# Patient Record
Sex: Female | Born: 1995 | Race: White | Hispanic: No | Marital: Married | Smoking: Former smoker
Health system: Southern US, Community
[De-identification: ages and names within clinical notes are randomized; demographics above are authoritative.]

## PROBLEM LIST (undated history)

## (undated) ENCOUNTER — Inpatient Hospital Stay: Payer: Self-pay

## (undated) ENCOUNTER — Emergency Department (HOSPITAL_COMMUNITY): Admission: EM | Payer: Medicaid Other

## (undated) DIAGNOSIS — F419 Anxiety disorder, unspecified: Secondary | ICD-10-CM

## (undated) DIAGNOSIS — R112 Nausea with vomiting, unspecified: Secondary | ICD-10-CM

## (undated) DIAGNOSIS — I10 Essential (primary) hypertension: Secondary | ICD-10-CM

## (undated) DIAGNOSIS — O139 Gestational [pregnancy-induced] hypertension without significant proteinuria, unspecified trimester: Secondary | ICD-10-CM

## (undated) DIAGNOSIS — Z9889 Other specified postprocedural states: Secondary | ICD-10-CM

## (undated) DIAGNOSIS — R110 Nausea: Secondary | ICD-10-CM

## (undated) DIAGNOSIS — J45909 Unspecified asthma, uncomplicated: Secondary | ICD-10-CM

## (undated) DIAGNOSIS — R7303 Prediabetes: Secondary | ICD-10-CM

## (undated) DIAGNOSIS — Z8679 Personal history of other diseases of the circulatory system: Secondary | ICD-10-CM

## (undated) DIAGNOSIS — K279 Peptic ulcer, site unspecified, unspecified as acute or chronic, without hemorrhage or perforation: Secondary | ICD-10-CM

## (undated) DIAGNOSIS — R109 Unspecified abdominal pain: Secondary | ICD-10-CM

## (undated) DIAGNOSIS — F32A Depression, unspecified: Secondary | ICD-10-CM

## (undated) HISTORY — PX: CHOLECYSTECTOMY: SHX55

## (undated) HISTORY — DX: Peptic ulcer, site unspecified, unspecified as acute or chronic, without hemorrhage or perforation: K27.9

## (undated) HISTORY — DX: Nausea: R11.0

## (undated) HISTORY — PX: TONSILLECTOMY: SUR1361

## (undated) HISTORY — DX: Personal history of other diseases of the circulatory system: Z86.79

## (undated) HISTORY — DX: Prediabetes: R73.03

## (undated) HISTORY — PX: WISDOM TOOTH EXTRACTION: SHX21

## (undated) HISTORY — DX: Unspecified abdominal pain: R10.9

## (undated) HISTORY — PX: GALLBLADDER SURGERY: SHX652

## (undated) HISTORY — DX: Essential (primary) hypertension: I10

---
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
## 2015-01-16 ENCOUNTER — Ambulatory Visit: Payer: Medicaid Other | Attending: Family Medicine | Admitting: Physical Therapy

## 2015-01-16 DIAGNOSIS — M545 Low back pain: Secondary | ICD-10-CM | POA: Diagnosis not present

## 2015-01-16 DIAGNOSIS — G8929 Other chronic pain: Secondary | ICD-10-CM | POA: Diagnosis present

## 2015-01-16 NOTE — Therapy (Signed)
[facility] PHYSICAL AND SPORTS MEDICINE [address] [place], [place], [address] Phone: [phone]   Fax:  [phone]  Physical Therapy Evaluation  Patient Details  Name: Stephanie MartesCierra Shannon MRN: [id number] Date of Birth: [birth year] No Data Recorded  Encounter Date: 01/16/2015      PT End of Session - 01/16/15 1118    Visit Number 1   Number of Visits 7   Date for PT Re-Evaluation 02/27/15   PT Start Time 1020   PT Stop Time 1105   PT Time Calculation (min) 45 min   Behavior During Therapy Flat affect  Very disinterested       Past Medical History  Diagnosis Date  . Peptic ulcer disease   . Nausea   . Abdominal pain   . Asthma     Past Surgical History  Procedure Laterality Date  . Tonsillectomy      There were no vitals filed for this visit.  Visit Diagnosis:  Chronic midline low back pain without sciatica - Plan: PT plan of care cert/re-cert      Subjective Assessment - 01/16/15 1122    Subjective Pt reports that she fell from a bunk bed roughly 6 years ago and "broke a bone in her back", she reports she did not see a doctor for this. She reports at first her pain was intermittent, now it is constant. SHe works at [employer] currently, but will be switching to [employer] shortly.    Limitations Lifting   Patient Stated Goals Patient does not provide any stated goals.    Currently in Pain? Yes   Pain Score 4    Pain Location Abdomen   Pain Orientation Lower   Pain Descriptors / Indicators Aching   Pain Type Chronic pain   Pain Onset More than a month ago   Pain Frequency Constant   Aggravating Factors  Lifting    Pain Relieving Factors She does not report anything alleviating her pain.             [facility] Assessment - 01/16/15 0001    Assessment   Medical Diagnosis --  Acute low back pain   Precautions   Precautions None   Restrictions   Weight Bearing Restrictions No   Observation/Other Assessments   Modified Osw[place]ry 16   Fear Avoidance Belief Questionnaire (FABQ)  18   Posture/Postural Control   Posture Comments --  Rounded shoulders, lordotic lumbar spine   Strength   Overall Strength Comments --  5/5 for tested LEs   FABER test   findings Negative   Slump test   Findings Negative   Straight Leg Raise   Findings Negative   Criag's Test    Findings Negative   Ely's Test   Findings Negative   Hip Scouring   Findings Negative     Piriformis stretching, hamstring stretching, IR, ER of the hip all demonstrated WNL mobility and no change in pain quality.   CPAs grade I-II in lumbar spine described as tender, however no change in pain quality.   Repeated trunk flexion x 10 was not aggravating, did not decrease pain she experienced with trunk extension.   Trunk rotations sitting at corner of table x 3 bilaterally with self over-pressure no change in pain reported. She reports she does these at home and feels relief when she feels a cavitation.   MMT of glute max demonstrates substitution of lumbar paraspinals. Supine bridging with cuing for pushing through her heels demonstrates  altered NM control (lumbar extensors activated prior to glutes per where she felt activity). X 10 repetitions with 3-5" holds increased symptoms.   Manual cuing for assistance with pelvic rocking in supine x 30 seconds, no change in quality of pain.                       PT Education - 01/16/15 1117    Education provided Yes   Education Details Educated on lifting technique.    Person(s) Educated Patient   Methods Explanation;Demonstration   Comprehension Verbalized understanding;Returned demonstration             PT Long Term Goals - 01/16/15 1142    PT LONG TERM GOAL #1   Title Patient will be independent with a HEP to decrease her LBP symptoms.    Time 6   Period Weeks   Status New   PT LONG TERM GOAL #2   Title Patient will report an ODI score of less than 9% to  demonstrate increased tolerance for ADLs and work related activities.    Baseline 18%   Time 6   Period Weeks   Status New   PT LONG TERM GOAL #3   Title Patient will demonstrate proper lifting mechanics consistently and report no increase in pain while lifting a 20# weight to demonstrate increased tolerance for work related activities.    Time 6   Period Weeks   Status New               Plan - 01/16/15 1130    Clinical Impression Statement Patient demonstrates very withdrawn affect and does not make eye contact with PT for long periods of time during evaluation. No real biomechanical origin of symptoms was identified today as patient states nothing performed today relieved or exacerbated her symptoms (aside from end range trunk extension). She does demonstrate poor lifting mechanics and generalized hypermobility indicating she may have some instability, though unable to formally assess this beyond ROM and technique modification.    Pt will benefit from skilled therapeutic intervention in order to improve on the following deficits Pain;Improper body mechanics   Rehab Potential Poor   Clinical Impairments Affecting Rehab Potential Patient has very withdrawn affect and does not seem interested in participating in this session.    PT Frequency 1x / week   PT Duration 6 weeks   PT Treatment/Interventions Manual techniques;Therapeutic exercise;Therapeutic activities;Cryotherapy;Gait training   PT Next Visit Plan Re-assess mechanisms behind symptomology.    Consulted and Agree with Plan of Care Patient         Problem List Patient Active Problem List   Diagnosis Date Noted  . Nausea with vomiting   . RUQ abdominal pain 07/21/2011  . Diarrhea 07/21/2011  . Excessive gas 07/21/2011  . Hematemesis    Kerin Ransom, PT, DPT    01/16/2015, 4:21 PM  [facility] PHYSICAL AND SPORTS MEDICINE [address] [place], [address] Phone:  [phone]   Fax:  [phone]  Name: [name] MRN: [id number] Date of Birth: 1995/07/16

## 2015-01-18 ENCOUNTER — Emergency Department
Admission: EM | Admit: 2015-01-18 | Discharge: 2015-01-18 | Disposition: A | Discharge status: Home / Self Care | Payer: Medicaid Other | Attending: Emergency Medicine | Admitting: Emergency Medicine

## 2015-01-18 ENCOUNTER — Encounter: Payer: Self-pay | Admitting: Emergency Medicine

## 2015-01-18 DIAGNOSIS — Z7951 Long term (current) use of inhaled steroids: Secondary | ICD-10-CM | POA: Insufficient documentation

## 2015-01-18 DIAGNOSIS — F17210 Nicotine dependence, cigarettes, uncomplicated: Secondary | ICD-10-CM | POA: Diagnosis not present

## 2015-01-18 DIAGNOSIS — K029 Dental caries, unspecified: Secondary | ICD-10-CM | POA: Diagnosis not present

## 2015-01-18 DIAGNOSIS — K0889 Other specified disorders of teeth and supporting structures: Secondary | ICD-10-CM | POA: Diagnosis present

## 2015-01-18 DIAGNOSIS — Z79899 Other long term (current) drug therapy: Secondary | ICD-10-CM | POA: Diagnosis not present

## 2015-01-18 MED ORDER — AMOXICILLIN 500 MG PO TABS: 500.0000 mg | ORAL_TABLET | Freq: Two times a day (BID) | ORAL | Status: DC

## 2015-01-18 MED ORDER — IBUPROFEN 800 MG PO TABS: 800.0000 mg | ORAL_TABLET | Freq: Three times a day (TID) | ORAL | Status: DC | PRN

## 2015-01-18 MED ORDER — HYDROCODONE-ACETAMINOPHEN 5-325 MG PO TABS: 1.0000 | ORAL_TABLET | ORAL | Status: DC | PRN

## 2015-01-18 MED ORDER — LIDOCAINE VISCOUS 2 % MT SOLN: 10.0000 mL | OROMUCOSAL | Status: DC | PRN

## 2015-01-18 NOTE — Discharge Instructions (Signed)
OPTIONS FOR DENTAL FOLLOW UP CARE ° °[place] Department of Health and Human Services - Local Safety Net Dental Clinics °http://www.ncdhhs.gov/dph/oralhealth/services/safetynetclinics.htm °  °[facility] (336-562-3123) ° °[facility] ([phone]) ° °[facility] ([phone] ext [phone]) ° °[facility] ([phone]) ° °[facility] ([phone]) °This clinic caters to the indigent population and is on a lottery system. °Location: °[facility], [address], [place] °Clinic Hours: °Wednesdays from 6pm - 9pm, patients seen by a lottery system. °For dates, call or go to [email or web address]/[facility]/patients/[email or web address]-[facility] °Services: °Cleanings, fillings and simple extractions. °Payment Options: °DENTAL WORK IS FREE OF CHARGE. Bring proof of income or support. °Best way to get seen: °Arrive at 5:15 pm - this is a lottery, NOT first come/first serve, so arriving earlier will not increase your chances of being seen. °  °  °[facility] °[phone] °Select option 1 for emergencies °  °Location: °[facility], [address], [place] °Clinic Hours: °No walk-ins accepted - call the day before to schedule an appointment. °Check in times are 9:30 am and 1:30 pm. °Services: °Simple extractions, temporary fillings, pulpectomy/pulp debridement, uncomplicated abscess drainage. °Payment Options: °PAYMENT IS DUE AT THE TIME OF SERVICE.  Fee is usually $100-200, additional surgical procedures (e.g. abscess drainage) may be extra. °Cash, checks, Visa/MasterCard accepted.  Can file Medicaid if patient is covered for dental - patient should call case worker to check. °No discount for [facility] patients. °Best way to get seen: °MUST call the day before and get onto the schedule. Can usually be seen the next 1-2 days. No walk-ins accepted. °  °  °[facility] °[phone] °   °Location: °[facility], [address], [place] °Clinic Hours: °M, W, Th, F 8am or 1:30pm, Tues 9a or 1:30 - first come/first served. °Services: °Simple extractions, temporary fillings, uncomplicated abscess drainage.  You do not need to be an [place] resident. °Payment Options: °PAYMENT IS DUE AT THE TIME OF SERVICE. °Dental insurance, otherwise sliding scale - bring proof of income or support. °Depending on income and treatment needed, cost is usually $50-200. °Best way to get seen: °Arrive early as it is first come/first served. °  °  °[facility] °[phone] °  °Location: °[address] °Clinic Hours: °Mon-Thu 8a-5p °Services: °Most basic dental services including extractions and fillings. °Payment Options: °PAYMENT IS DUE AT THE TIME OF SERVICE. °Sliding scale, up to 50% off - bring proof if income or support. °Medicaid with dental option accepted. °Best way to get seen: °Call to schedule an appointment, can usually be seen within 2 weeks OR they will try to see walk-ins - show up at 8a or 2p (you may have to wait). °  °  °[facility] °[phone] °[place] RESIDENTS ONLY °  °Location: °[facility], [address], [place],  [address] °Clinic Hours: By appointment only. °Monday - Thursday 8am-5pm, Friday 8am-12pm °Services: Cleanings, fillings, extractions. °Payment Options: °PAYMENT IS DUE AT THE TIME OF SERVICE. °Cash, Visa or MasterCard. Sliding scale - $30 minimum per service. °Best way to get seen: °Come in to office, complete packet and make an appointment - need proof of income °or support monies for each household member and proof of [place] residence. °Usually takes about a month to get in. °  °  °[facility] °[phone] °  °Location: °[address]   [place] [facility] Hours: Walk-in Urgent Care Dental Services are offered Monday-Friday  mornings only. The numbers of emergencies accepted daily is limited to the number of providers available. Maximum 15 - Mondays, Wednesdays & Thursdays Maximum 10 - Tuesdays & Fridays Services: You do not need to be a [place] resident to be seen for a dental emergency. Emergencies are defined as pain, swelling, abnormal bleeding, or dental trauma. Walkins will receive x-rays if needed. NOTE: Dental cleaning is not an emergency. Payment Options: PAYMENT IS DUE AT THE TIME OF SERVICE. Minimum co-pay is $40.00 for uninsured patients. Minimum co-pay is $3.00 for Medicaid with dental coverage. Dental Insurance is accepted and must be presented at time of visit. Medicare does not cover dental. Forms of payment: Cash, credit card, checks. Best way to get seen: If not previously registered with the clinic, walk-in dental registration begins at 7:15 am and is on a first come/first serve basis. If previously registered with the clinic, call to make an appointment.     [facility] [phone] [place] RESIDENTS ONLY   Location: [address], [place], [place] Clinic Hours: Mon-Thu 10a-2p Services: Extractions only! Payment Options: FREE (donations accepted) - bring proof of income or support Best way to get seen: Call and schedule an appointment OR come at 8am on the 1st Monday of every month (except for holidays) when it is first come/first served.     [facility] [phone]   Location: [address], [place] [facility] Hours: Friday mornings Services, Payment Options, Best way to get seen: Call for info   Dental Caries Dental caries is tooth decay. This decay can cause a hole in teeth (cavity) that can get bigger and deeper over time. HOME CARE  Brush and floss your teeth. Do this at least two times a day.  Use a fluoride toothpaste.  Use a mouth rinse if told by your dentist or doctor.  Eat less sugary and starchy foods. Drink less sugary  drinks.  Avoid snacking often on sugary and starchy foods. Avoid sipping often on sugary drinks.  Keep regular checkups and cleanings with your dentist.  Use fluoride supplements if told by your dentist or doctor.  Allow fluoride to be applied to teeth if told by your dentist or doctor.   This information is not intended to replace advice given to you by your health care provider. Make sure you discuss any questions you have with your health care provider.   Document Released: 11/25/2007 Document Revised: 03/08/2014 Document Reviewed: 02/18/2012 Elsevier Interactive Patient Education 2016 Elsevier Inc[employer]  Dental Pain Dental pain may be caused by many things, including:  Tooth decay (cavities or caries). Cavities expose the nerve of your tooth to air and hot or cold temperatures. This can cause pain or discomfort.  Abscess or infection. A dental abscess is a collection of infected pus from a bacterial infection in the inner part of the tooth (pulp). It usually occurs at the end of the tooth's root.  Injury.  An unknown reason (idiopathic). Your pain may be mild or severe. It may only occur when:  You are chewing.  You are exposed to hot or cold temperature.  You are eating or drinking sugary foods or beverages, such as soda or candy. Your pain may also be constant. HOME CARE INSTRUCTIONS Watch your dental pain for any changes. The following actions may help to lessen any discomfort that you are feeling:  Take medicines only as directed by your dentist.  If you were prescribed an antibiotic medicine, finish all of it even if you start to feel better.  Keep all follow-up visits as directed by your dentist. This is important.  Do not apply heat to the outside of your face.  Rinse your mouth or gargle with salt water if directed by your dentist. This helps with pain and swelling.  You can make salt water by adding  tsp of salt to 1 cup of warm water.  Apply ice to the  painful area of your face:  Put ice in a plastic bag.  Place a towel between your skin and the bag.  Leave the ice on for 20 minutes, 2-3 times per day.  Avoid foods or drinks that cause you pain, such as:  Very hot or very cold foods or drinks.  Sweet or sugary foods or drinks. SEEK MEDICAL CARE IF:  Your pain is not controlled with medicines.  Your symptoms are worse.  You have new symptoms. SEEK IMMEDIATE MEDICAL CARE IF:  You are unable to open your mouth.  You are having trouble breathing or swallowing.  You have a fever.  Your face, neck, or jaw is swollen.   This information is not intended to replace advice given to you by your health care provider. Make sure you discuss any questions you have with your health care provider.   Document Released: 02/15/2005 Document Revised: 07/02/2014 Document Reviewed: 02/11/2014 Elsevier Interactive Patient Education [employer]

## 2015-01-18 NOTE — ED Notes (Signed)
Pt to ed with c/o toothache x 2 days.

## 2015-01-18 NOTE — ED Provider Notes (Signed)
[facility] Emergency Department Provider Note  ____________________________________________  Time seen: Approximately 8:26 AM  I have reviewed the triage vital signs and the nursing notes.   HISTORY  Chief Complaint Dental Pain    HPI Flecia Deyton is a 19 y.o. female presents for evaluation of toothache 2 days. Rates her pain as a 10 over 10.   Past Medical History  Diagnosis Date  . Peptic ulcer disease   . Nausea   . Abdominal pain   . Asthma     Patient Active Problem List   Diagnosis Date Noted  . Nausea with vomiting   . RUQ abdominal pain 07/21/2011  . Diarrhea 07/21/2011  . Excessive gas 07/21/2011  . Hematemesis     Past Surgical History  Procedure Laterality Date  . Tonsillectomy      Current Outpatient Rx  Name  Route  Sig  Dispense  Refill  . amoxicillin (AMOXIL) 500 MG tablet   Oral   Take 1 tablet (500 mg total) by mouth 2 (two) times daily.   20 tablet   0   . beclomethasone (QVAR) 40 MCG/ACT inhaler   Inhalation   Inhale 2 puffs into the lungs 2 (two) times daily.         Marland Kitchen dicyclomine (BENTYL) 20 MG tablet   Oral   Take 1 tablet (20 mg total) by mouth every 6 (six) hours as needed for spasms (for abdominal cramping).   20 tablet   0   . famotidine (PEPCID) 20 MG tablet   Oral   Take 1 tablet (20 mg total) by mouth 2 (two) times daily.   30 tablet   0   . HYDROcodone-acetaminophen (NORCO) 5-325 MG tablet   Oral   Take 1-2 tablets by mouth every 4 (four) hours as needed for moderate pain.   15 tablet   0   . ibuprofen (ADVIL,MOTRIN) 800 MG tablet   Oral   Take 1 tablet (800 mg total) by mouth every 8 (eight) hours as needed.   30 tablet   0   . lamoTRIgine (LAMICTAL) 25 MG tablet   Oral   Take 25 mg by mouth 2 (two) times daily.         Marland Kitchen lidocaine (XYLOCAINE) 2 % solution   Mouth/Throat   Use as directed 10 mLs in the mouth or throat as needed for mouth pain (dental pain).   100 mL    0   . sertraline (ZOLOFT) 50 MG tablet   Oral   Take 50 mg by mouth daily.         . sucralfate (CARAFATE) 1 G tablet   Oral   Take 1 tablet (1 g total) by mouth 4 (four) times daily.   30 tablet   0     Allergies Metformin and related and Sprintec 28  Family History  Problem Relation Age of Onset  . Cholelithiasis Mother   . SIDS Brother   . Cholelithiasis Maternal Grandmother   . Ulcers Maternal Grandmother   . Celiac disease Neg Hx     Social History Social History  Substance Use Topics  . Smoking status: Current Every Day Smoker -- 1.00 packs/day    Types: Cigarettes  . Smokeless tobacco: Never Used  . Alcohol Use: No    Review of Systems Constitutional: No fever/chills Eyes: No visual changes. ENT: No sore throat. Positive for toothache. Cardiovascular: Denies chest pain. Respiratory: Denies shortness of breath. Gastrointestinal: No abdominal pain.  No nausea, no vomiting.  No diarrhea.  No constipation. Genitourinary: Negative for dysuria. Musculoskeletal: Negative for back pain. Skin: Negative for rash. Neurological: Negative for headaches, focal weakness or numbness.  10-point ROS otherwise negative.  ____________________________________________   PHYSICAL EXAM:  VITAL SIGNS: ED Triage Vitals  Enc Vitals Group     BP --      Pulse --      Resp --      Temp --      Temp src --      SpO2 --      Weight --      Height --      Head Cir --      Peak Flow --      Pain Score 01/18/15 0823 10     Pain Loc --      Pain Edu? --      Excl. in GC? --     Constitutional: Alert and oriented. Well appearing and in no acute distress. Eyes: Conjunctivae are normal. PERRL. EOMI. Head: Atraumatic. Nose: No congestion/rhinnorhea. Mouth/Throat: Mucous membranes are moist.  Oropharynx non-erythematous. Neck: No stridor.   Cardiovascular: Normal rate, regular rhythm. Grossly normal heart sounds.  Good peripheral circulation. Respiratory: Normal  respiratory effort.  No retractions. Lungs CTAB. Gastrointestinal: Soft and nontender. No distention. No abdominal bruits. No CVA tenderness. Musculoskeletal: No lower extremity tenderness nor edema.  No joint effusions. Neurologic:  Normal speech and language. No gross focal neurologic deficits are appreciated. No gait instability. Skin:  Skin is warm, dry and intact. No rash noted. Psychiatric: Mood and affect are normal. Speech and behavior are normal.  ____________________________________________   LABS (all labs ordered are listed, but only abnormal results are displayed)  Labs Reviewed - No data to display ____________________________________________   PROCEDURES  Procedure(s) performed: None  Critical Care performed: No  ____________________________________________   INITIAL IMPRESSION / ASSESSMENT AND PLAN / ED COURSE  Pertinent labs & imaging results that were available during my care of the patient were reviewed by me and considered in my medical decision making (see chart for details).  Acute dental pain with dental caries. Rx given for amoxicillin 500 mg 3 times a day #30, Motrin 800 mg 3 times a day. In addition she was given some hydrocodone 5/325 #10. Patient follow-up with local dentists, list has been provided. She voices no other emergency medical complaints at this time. ____________________________________________   FINAL CLINICAL IMPRESSION(S) / ED DIAGNOSES  Final diagnoses:  Pain due to dental caries      Evangeline Dakinharles M Beers, PA-C 01/18/15 [id number]  Myrna Blazeravid Matthew Schaevitz, MD 01/18/15 [phone]

## 2015-01-20 ENCOUNTER — Ambulatory Visit: Payer: Medicaid Other | Admitting: Physical Therapy

## 2015-01-22 ENCOUNTER — Ambulatory Visit: Payer: Medicaid Other | Admitting: Physical Therapy

## 2015-01-28 ENCOUNTER — Ambulatory Visit: Payer: Medicaid Other | Admitting: Physical Therapy

## 2015-01-30 ENCOUNTER — Ambulatory Visit: Payer: Medicaid Other | Attending: Family Medicine | Admitting: Physical Therapy

## 2015-01-31 ENCOUNTER — Emergency Department
Admission: EM | Admit: 2015-01-31 | Discharge: 2015-01-31 | Disposition: A | Discharge status: Home / Self Care | Payer: Medicaid Other | Attending: Emergency Medicine | Admitting: Emergency Medicine

## 2015-01-31 DIAGNOSIS — K029 Dental caries, unspecified: Secondary | ICD-10-CM | POA: Insufficient documentation

## 2015-01-31 DIAGNOSIS — Z792 Long term (current) use of antibiotics: Secondary | ICD-10-CM | POA: Insufficient documentation

## 2015-01-31 DIAGNOSIS — K0889 Other specified disorders of teeth and supporting structures: Secondary | ICD-10-CM | POA: Diagnosis not present

## 2015-01-31 DIAGNOSIS — F17210 Nicotine dependence, cigarettes, uncomplicated: Secondary | ICD-10-CM | POA: Insufficient documentation

## 2015-01-31 DIAGNOSIS — Z7951 Long term (current) use of inhaled steroids: Secondary | ICD-10-CM | POA: Diagnosis not present

## 2015-01-31 MED ORDER — HYDROCODONE-ACETAMINOPHEN 5-325 MG PO TABS
1.0000 | ORAL_TABLET | Freq: Once | ORAL | Status: AC
  Administered 2015-01-31: 1 via ORAL
  Filled 2015-01-31: qty 1

## 2015-01-31 MED ORDER — TRAMADOL HCL 50 MG PO TABS: 50.0000 mg | ORAL_TABLET | Freq: Four times a day (QID) | ORAL | Status: DC | PRN

## 2015-01-31 NOTE — Discharge Instructions (Signed)
OPTIONS FOR DENTAL FOLLOW UP CARE ° °[place] Department of Health and Human Services - Local Safety Net Dental Clinics °http://www.ncdhhs.gov/dph/oralhealth/services/safetynetclinics.htm °  °[facility] (336-562-3123) ° °[facility] ([phone]) ° °[facility] ([phone] ext [phone]) ° °[facility] ([phone]) ° °[facility] ([phone]) °This clinic caters to the indigent population and is on a lottery system. °Location: °[facility], [address], [place] °Clinic Hours: °Wednesdays from 6pm - 9pm, patients seen by a lottery system. °For dates, call or go to [email or web address] °Services: °Cleanings, fillings and simple extractions. °Payment Options: °DENTAL WORK IS FREE OF CHARGE. Bring proof of income or support. °Best way to get seen: °Arrive at 5:15 pm - this is a lottery, NOT first come/first serve, so arriving earlier will not increase your chances of being seen. °  °  °[facility] °[phone] °Select option 1 for emergencies °  °Location: °[facility], [address], [place] °Clinic Hours: °No walk-ins accepted - call the day before to schedule an appointment. °Check in times are 9:30 am and 1:30 pm. °Services: °Simple extractions, temporary fillings, pulpectomy/pulp debridement, uncomplicated abscess drainage. °Payment Options: °PAYMENT IS DUE AT THE TIME OF SERVICE.  Fee is usually $100-200, additional surgical procedures (e.g. abscess drainage) may be extra. °Cash, checks, Visa/MasterCard accepted.  Can file Medicaid if patient is covered for dental - patient should call case worker to check. °No discount for [facility] patients. °Best way to get seen: °MUST call the day before and get onto the schedule. Can usually be seen the next 1-2 days. No walk-ins accepted. °  °  °[facility] °[phone] °   °Location: °[facility], [address], [place] °Clinic Hours: °M, W, Th, F 8am or 1:30pm, Tues 9a or 1:30 - first come/first served. °Services: °Simple extractions, temporary fillings, uncomplicated abscess drainage.  You do not need to be an [place] resident. °Payment Options: °PAYMENT IS DUE AT THE TIME OF SERVICE. °Dental insurance, otherwise sliding scale - bring proof of income or support. °Depending on income and treatment needed, cost is usually $50-200. °Best way to get seen: °Arrive early as it is first come/first served. °  °  °[facility] °[phone] °  °Location: °[address] °Clinic Hours: °Mon-Thu 8a-5p °Services: °Most basic dental services including extractions and fillings. °Payment Options: °PAYMENT IS DUE AT THE TIME OF SERVICE. °Sliding scale, up to 50% off - bring proof if income or support. °Medicaid with dental option accepted. °Best way to get seen: °Call to schedule an appointment, can usually be seen within 2 weeks OR they will try to see walk-ins - show up at 8a or 2p (you may have to wait). °  °  °[facility] °[phone] °[place] RESIDENTS ONLY °  °Location: °[facility], [address], [place], [place] [address] °Clinic Hours: By appointment only. °Monday - Thursday 8am-5pm, Friday 8am-12pm °Services: Cleanings, fillings, extractions. °Payment Options: °PAYMENT IS DUE AT THE TIME OF SERVICE. °Cash, Visa or MasterCard. Sliding scale - $30 minimum per service. °Best way to get seen: °Come in to office, complete packet and make an appointment - need proof of income °or support monies for each household member and proof of [place] residence. °Usually takes about a month to get in. °  °  °[facility] °[phone] °  °Location: °[address]   [place] °Clinic Hours: Walk-in Urgent Care Dental Services are offered Monday-Friday  mornings only. °The numbers of emergencies accepted daily is limited to the number of °providers available. °Maximum 15 - Mondays, Wednesdays & Thursdays °Maximum 10 - Tuesdays & Fridays °Services: °You do not need to be a [place] resident to be seen for a dental emergency. °Emergencies are defined as pain, swelling, abnormal bleeding, or dental trauma. Walkins will receive x-rays if needed. °NOTE: Dental cleaning is not an emergency. °Payment Options: °PAYMENT IS DUE AT THE TIME OF SERVICE. °Minimum co-pay is $40.00 for uninsured patients. °Minimum co-pay is $3.00 for Medicaid with dental coverage. °Dental Insurance is accepted and must be presented at time of visit. °Medicare does not cover dental. °Forms of payment: Cash, credit card, checks. °Best way to get seen: °If not previously registered with the clinic, walk-in dental registration begins at 7:15 am and is on a first come/first serve basis. °If previously registered with the clinic, call to make an appointment. °  °  °[facility] °[phone] °[place] RESIDENTS ONLY °  °Location: °[address], [place], [place] °Clinic Hours: °Mon-Thu 10a-2p °Services: Extractions only! °Payment Options: °FREE (donations accepted) - bring proof of income or support °Best way to get seen: °Call and schedule an appointment OR come at 8am on the 1st Monday of every month (except for holidays) when it is first come/first served. °  °  °[facility] °[phone] °  °Location: °[address], [place] °Clinic Hours: °Friday mornings °Services, Payment Options, Best way to get seen: °Call for info °

## 2015-01-31 NOTE — ED Notes (Signed)
Pt reports pain to upper left molar following temporary filling two days ago.  PT wanting pain medication to help her reach follow up appointment not yet scheduled.

## 2015-01-31 NOTE — ED Provider Notes (Signed)
[facility] Emergency Department Provider Note  Time seen: 3:41 AM  I have reviewed the triage vital signs and the nursing notes.   HISTORY  Chief Complaint Dental Pain    HPI Stephanie MassonCierra Shannon is a 19 y.o. female with no past medical history who presents the emergency department with left upper molar pain. According to the patient she was seen in the emergency department 01/18/15, for left upper molar dental pain. She finished a course of antibiotics, and followed up with a dentist who saw her this week and placed a temporary filling over the tooth. Patient is currently waiting to be scheduled for further treatment. States she continues to have pain in the tooth. Describes her pain as severe, much worse with chewing. Denies fever.    Past Medical History  Diagnosis Date  . Peptic ulcer disease   . Nausea   . Abdominal pain   . Asthma     Patient Active Problem List   Diagnosis Date Noted  . Nausea with vomiting   . RUQ abdominal pain 07/21/2011  . Diarrhea 07/21/2011  . Excessive gas 07/21/2011  . Hematemesis     Past Surgical History  Procedure Laterality Date  . Tonsillectomy      Current Outpatient Rx  Name  Route  Sig  Dispense  Refill  . amoxicillin (AMOXIL) 500 MG tablet   Oral   Take 1 tablet (500 mg total) by mouth 2 (two) times daily.   20 tablet   0   . beclomethasone (QVAR) 40 MCG/ACT inhaler   Inhalation   Inhale 2 puffs into the lungs 2 (two) times daily.         Marland Kitchen. dicyclomine (BENTYL) 20 MG tablet   Oral   Take 1 tablet (20 mg total) by mouth every 6 (six) hours as needed for spasms (for abdominal cramping).   20 tablet   0   . famotidine (PEPCID) 20 MG tablet   Oral   Take 1 tablet (20 mg total) by mouth 2 (two) times daily.   30 tablet   0   . HYDROcodone-acetaminophen (NORCO) 5-325 MG tablet   Oral   Take 1-2 tablets by mouth every 4 (four) hours as needed for moderate pain.   15 tablet   0   . ibuprofen  (ADVIL,MOTRIN) 800 MG tablet   Oral   Take 1 tablet (800 mg total) by mouth every 8 (eight) hours as needed.   30 tablet   0   . lamoTRIgine (LAMICTAL) 25 MG tablet   Oral   Take 25 mg by mouth 2 (two) times daily.         Marland Kitchen. lidocaine (XYLOCAINE) 2 % solution   Mouth/Throat   Use as directed 10 mLs in the mouth or throat as needed for mouth pain (dental pain).   100 mL   0   . sertraline (ZOLOFT) 50 MG tablet   Oral   Take 50 mg by mouth daily.         . sucralfate (CARAFATE) 1 G tablet   Oral   Take 1 tablet (1 g total) by mouth 4 (four) times daily.   30 tablet   0     Allergies Metformin and related and Sprintec 28  Family History  Problem Relation Age of Onset  . Cholelithiasis Mother   . SIDS Brother   . Cholelithiasis Maternal Grandmother   . Ulcers Maternal Grandmother   . Celiac disease Neg Hx  Social History Social History  Substance Use Topics  . Smoking status: Current Every Day Smoker -- 1.00 packs/day    Types: Cigarettes  . Smokeless tobacco: Never Used  . Alcohol Use: No    Review of Systems Constitutional: Negative for fever. Cardiovascular: Negative for chest pain. Respiratory: Negative for shortness of breath. Gastrointestinal: Negative for abdominal pain 10-point ROS otherwise negative.  ____________________________________________   PHYSICAL EXAM:  VITAL SIGNS: ED Triage Vitals  Enc Vitals Group     BP 01/31/15 0323 134/56 mmHg     Pulse Rate 01/31/15 0323 104     Resp 01/31/15 0323 18     Temp 01/31/15 0323 98 F (36.7 C)     Temp Source 01/31/15 0323 Oral     SpO2 01/31/15 0323 99 %     Weight 01/31/15 0323 156 lb (70.761 kg)     Height 01/31/15 0323  (1.499 m)     Head Cir --      Peak Flow --      Pain Score 01/31/15 0324 9     Pain Loc --      Pain Edu? --      Excl. in GC? --     Constitutional: Alert and oriented. Well appearing and in no distress. Eyes: Normal exam ENT   Head: Normocephalic  and atraumatic.   Mouth/Throat: Mucous membranes are moist. Dental Cari and left upper molar with temporary filling in place. No signs of abscess. Tender to palpation of the tooth. Cardiovascular: Normal rate, regular rhythm. No murmur Respiratory: Normal respiratory effort without tachypnea nor retractions. Breath sounds are clear Gastrointestinal: Soft and nontender. Musculoskeletal: Nontender with normal range of motion in all extremities. Neurologic:  Normal speech and language. No gross focal neurologic deficits  Skin:  Skin is warm, dry and intact.  Psychiatric: Mood and affect are normal. Speech and behavior are normal. Patient exhibits appropriate insight and judgment.  ____________________________________________    INITIAL IMPRESSION / ASSESSMENT AND PLAN / ED COURSE  Pertinent labs & imaging results that were available during my care of the patient were reviewed by me and considered in my medical decision making (see chart for details).  Patient presents the emergency department left upper molar pain. Patient has a temporary filling in place. She has completed a course of antibiotics. We'll discharge with a short course of Ultram, the patient is to call her dentist today to arrange a follow-up appointment as soon as possible. Patient agreeable.  ____________________________________________   FINAL CLINICAL IMPRESSION(S) / ED DIAGNOSES  Dental pain   Minna Antis, MD 01/31/15 [phone]

## 2015-01-31 NOTE — ED Notes (Signed)
Toothache, had temporary filling 2 days ago

## 2015-02-11 ENCOUNTER — Ambulatory Visit: Payer: Medicaid Other | Admitting: Physical Therapy

## 2015-02-13 ENCOUNTER — Ambulatory Visit: Payer: Medicaid Other | Admitting: Physical Therapy

## 2015-04-27 ENCOUNTER — Encounter: Payer: Self-pay | Admitting: *Deleted

## 2015-04-27 ENCOUNTER — Emergency Department
Admission: EM | Admit: 2015-04-27 | Discharge: 2015-04-27 | Disposition: A | Discharge status: Home / Self Care | Payer: Medicaid Other | Attending: Emergency Medicine | Admitting: Emergency Medicine

## 2015-04-27 DIAGNOSIS — Z7951 Long term (current) use of inhaled steroids: Secondary | ICD-10-CM | POA: Insufficient documentation

## 2015-04-27 DIAGNOSIS — F17210 Nicotine dependence, cigarettes, uncomplicated: Secondary | ICD-10-CM | POA: Insufficient documentation

## 2015-04-27 DIAGNOSIS — K0381 Cracked tooth: Secondary | ICD-10-CM | POA: Insufficient documentation

## 2015-04-27 DIAGNOSIS — K0889 Other specified disorders of teeth and supporting structures: Secondary | ICD-10-CM | POA: Insufficient documentation

## 2015-04-27 DIAGNOSIS — K089 Disorder of teeth and supporting structures, unspecified: Secondary | ICD-10-CM

## 2015-04-27 DIAGNOSIS — Z79899 Other long term (current) drug therapy: Secondary | ICD-10-CM | POA: Insufficient documentation

## 2015-04-27 DIAGNOSIS — Z792 Long term (current) use of antibiotics: Secondary | ICD-10-CM | POA: Insufficient documentation

## 2015-04-27 DIAGNOSIS — G8929 Other chronic pain: Secondary | ICD-10-CM

## 2015-04-27 MED ORDER — TRAMADOL HCL 50 MG PO TABS: 50.0000 mg | ORAL_TABLET | Freq: Four times a day (QID) | ORAL | Status: DC | PRN

## 2015-04-27 MED ORDER — TRAMADOL HCL 50 MG PO TABS
50.0000 mg | ORAL_TABLET | Freq: Once | ORAL | Status: AC
  Administered 2015-04-27: 50 mg via ORAL
  Filled 2015-04-27: qty 1

## 2015-04-27 MED ORDER — IBUPROFEN 800 MG PO TABS
800.0000 mg | ORAL_TABLET | Freq: Once | ORAL | Status: AC
  Administered 2015-04-27: 800 mg via ORAL
  Filled 2015-04-27: qty 1

## 2015-04-27 MED ORDER — IBUPROFEN 50 MG PO CHEW: 50.0000 mg | CHEWABLE_TABLET | Freq: Three times a day (TID) | ORAL | Status: DC | PRN

## 2015-04-27 NOTE — ED Notes (Signed)
Pt reports having chronic tooth pain for the past year that has become worse over the past three days. Pt reports no home treatments have helped. Pt was told by dentist last year that she needed a root canal but pt reports not having a dentist at this time. Pt reports left jaw and facial pain. Pt denies having fevers.

## 2015-04-27 NOTE — Discharge Instructions (Signed)
Follow-up from the list of dental clinics provided. °OPTIONS FOR DENTAL FOLLOW UP CARE ° °[place] Department of Health and Human Services - Local Safety Net Dental Clinics °http://www.ncdhhs.gov/dph/oralhealth/services/safetynetclinics.htm °  °[facility] ([phone]) ° °[facility] ([phone]) ° °[facility] ([phone] ext [phone]) ° °[facility] ([phone]) ° °[facility] ([phone]) °This clinic caters to the indigent population and is on a lottery system. °Location: °[facility], [address], [place] °Clinic Hours: °Wednesdays from 6pm - 9pm, patients seen by a lottery system. °For dates, call or go to [email or web address] °Services: °Cleanings, fillings and simple extractions. °Payment Options: °DENTAL WORK IS FREE OF CHARGE. Bring proof of income or support. °Best way to get seen: °Arrive at 5:15 pm - this is a lottery, NOT first come/first serve, so arriving earlier will not increase your chances of being seen. °  °  °[facility] °[phone] °Select option 1 for emergencies °  °Location: °[facility], [address], [place] °Clinic Hours: °No walk-ins accepted - call the day before to schedule an appointment. °Check in times are 9:30 am and 1:30 pm. °Services: °Simple extractions, temporary fillings, pulpectomy/pulp debridement, uncomplicated abscess drainage. °Payment Options: °PAYMENT IS DUE AT THE TIME OF SERVICE.  Fee is usually $100-200, additional surgical procedures (e.g. abscess drainage) may be extra. °Cash, checks, Visa/MasterCard accepted.  Can file Medicaid if patient is covered for dental - patient should call case worker to check. °No discount for [facility] patients. °Best way to get seen: °MUST call the day before and get onto the schedule. Can usually be seen the next 1-2 days. No walk-ins accepted. °  °   °[facility] °[phone] °  °Location: °[facility], [address], [place] °Clinic Hours: °M, W, Th, F 8am or 1:30pm, Tues 9a or 1:30 - first come/first served. °Services: °Simple extractions, temporary fillings, uncomplicated abscess drainage.  You do not need to be an [place] resident. °Payment Options: °PAYMENT IS DUE AT THE TIME OF SERVICE. °Dental insurance, otherwise sliding scale - bring proof of income or support. °Depending on income and treatment needed, cost is usually $50-200. °Best way to get seen: °Arrive early as it is first come/first served. °  °  °[facility] °[phone] °  °Location: °[address] °Clinic Hours: °Mon-Thu 8a-5p °Services: °Most basic dental services including extractions and fillings. °Payment Options: °PAYMENT IS DUE AT THE TIME OF SERVICE. °Sliding scale, up to 50% off - bring proof if income or support. °Medicaid with dental option accepted. °Best way to get seen: °Call to schedule an appointment, can usually be seen within 2 weeks OR they will try to see walk-ins - show up at 8a or 2p (you may have to wait). °  °  °[facility] °[phone] °[place] RESIDENTS ONLY °  °Location: °[facility], [address], [place], [place] [address] °Clinic Hours: By appointment only. °Monday - Thursday 8am-5pm, Friday 8am-12pm °Services: Cleanings, fillings, extractions. °Payment Options: °PAYMENT IS DUE AT THE TIME OF SERVICE. °Cash, Visa or MasterCard. Sliding scale - $30 minimum per service. °Best way to get seen: °Come in to office, complete packet and make an appointment - need proof of income °or support monies for each household member and proof of [place] residence. °Usually takes about a month to get in. °  °  °[facility] °[phone] °  °Location: °[address] [place] °Clinic Hours: Walk-in Urgent Care  Dental Services are offered Monday-Friday mornings only. °The numbers of emergencies accepted daily is limited to the number of °providers available. °Maximum 15 - Mondays, Wednesdays & Thursdays °Maximum 10 - Tuesdays & Fridays °Services: °You do not need to be a [place] resident to be seen for a dental emergency. °Emergencies are defined as pain, swelling, abnormal bleeding, or dental trauma. Walkins will receive x-rays if needed. °NOTE: Dental cleaning is not an emergency. °Payment Options: °PAYMENT IS DUE AT THE TIME OF SERVICE. °Minimum co-pay is $40.00 for uninsured patients. °Minimum co-pay is $3.00 for Medicaid with dental coverage. °Dental Insurance is accepted and must be presented at time of visit. °Medicare does not cover dental. °Forms of payment: Cash, credit card, checks. °Best way to get seen: °If not previously registered with the clinic, walk-in dental registration begins at 7:15 am and is on a first come/first serve basis. °If previously registered with the clinic, call to make an appointment. °  °  °[facility] °[phone] °[place] RESIDENTS ONLY °  °Location: °[address], [place], [place] °Clinic Hours: °Mon-Thu 10a-2p °Services: Extractions only! °Payment Options: °FREE (donations accepted) - bring proof of income or support °Best way to get seen: °Call and schedule an appointment OR come at 8am on the 1st Monday of every month (except for holidays) when it is first come/first served. °  °  °[facility] °[phone] °  °Location: °[address], [place] °Clinic Hours: °Friday mornings °Services, Payment Options, Best way to get seen: °Call for info ° °

## 2015-04-27 NOTE — ED Notes (Signed)
AAOx3.  Skin warm and dry.  NAD 

## 2015-04-27 NOTE — ED Provider Notes (Signed)
[facility] Emergency Department Provider Note  ____________________________________________  Time seen: Approximately 6:34 PM  I have reviewed the triage vital signs and the nursing notes.   HISTORY  Chief Complaint Dental Pain    HPI [name] is a 20 y.o. female patient complaining of chronic dental pain for the past year. Patient pain is worse in the past 3 days. Patient state no treatment for this complaint. Patient states she was told she needed a root canal last year but did not have the funds to pay for this procedure. Patient denies any swelling or fever with this complaint. Patient rates her pain as a 10 over 10.   Past Medical History  Diagnosis Date  . Peptic ulcer disease   . Nausea   . Abdominal pain   . Asthma     Patient Active Problem List   Diagnosis Date Noted  . Nausea with vomiting   . RUQ abdominal pain 07/21/2011  . Diarrhea 07/21/2011  . Excessive gas 07/21/2011  . Hematemesis     Past Surgical History  Procedure Laterality Date  . Tonsillectomy      Current Outpatient Rx  Name  Route  Sig  Dispense  Refill  . amoxicillin (AMOXIL) 500 MG tablet   Oral   Take 1 tablet (500 mg total) by mouth 2 (two) times daily.   20 tablet   0   . beclomethasone (QVAR) 40 MCG/ACT inhaler   Inhalation   Inhale 2 puffs into the lungs 2 (two) times daily.         Marland Kitchen dicyclomine (BENTYL) 20 MG tablet   Oral   Take 1 tablet (20 mg total) by mouth every 6 (six) hours as needed for spasms (for abdominal cramping).   20 tablet   0   . famotidine (PEPCID) 20 MG tablet   Oral   Take 1 tablet (20 mg total) by mouth 2 (two) times daily.   30 tablet   0   . HYDROcodone-acetaminophen (NORCO) 5-325 MG tablet   Oral   Take 1-2 tablets by mouth every 4 (four) hours as needed for moderate pain.   15 tablet   0   . ibuprofen (ADVIL,MOTRIN) 50 MG chewable tablet   Oral   Chew 1 tablet (50 mg total) by mouth every 8 (eight)  hours as needed for fever.   24 tablet   2   . ibuprofen (ADVIL,MOTRIN) 800 MG tablet   Oral   Take 1 tablet (800 mg total) by mouth every 8 (eight) hours as needed.   30 tablet   0   . lamoTRIgine (LAMICTAL) 25 MG tablet   Oral   Take 25 mg by mouth 2 (two) times daily.         Marland Kitchen lidocaine (XYLOCAINE) 2 % solution   Mouth/Throat   Use as directed 10 mLs in the mouth or throat as needed for mouth pain (dental pain).   100 mL   0   . sertraline (ZOLOFT) 50 MG tablet   Oral   Take 50 mg by mouth daily.         . sucralfate (CARAFATE) 1 G tablet   Oral   Take 1 tablet (1 g total) by mouth 4 (four) times daily.   30 tablet   0   . traMADol (ULTRAM) 50 MG tablet   Oral   Take 1 tablet (50 mg total) by mouth every 6 (six) hours as needed.   20 tablet  0   . traMADol (ULTRAM) 50 MG tablet   Oral   Take 1 tablet (50 mg total) by mouth every 6 (six) hours as needed for moderate pain.   12 tablet   0     Allergies Metformin and related and Sprintec 28  Family History  Problem Relation Age of Onset  . Cholelithiasis Mother   . SIDS Brother   . Cholelithiasis Maternal Grandmother   . Ulcers Maternal Grandmother   . Celiac disease Neg Hx     Social History Social History  Substance Use Topics  . Smoking status: Current Every Day Smoker -- 1.00 packs/day    Types: Cigarettes  . Smokeless tobacco: Never Used  . Alcohol Use: No    Review of Systems Constitutional: No fever/chills Eyes: No visual changes. ENT: No sore throat. Dental pain Cardiovascular: Denies chest pain. Respiratory: Denies shortness of breath. Gastrointestinal: No abdominal pain.  No nausea, no vomiting.  No diarrhea.  No constipation. Genitourinary: Negative for dysuria. Musculoskeletal: Negative for back pain. Skin: Negative for rash. Neurological: Negative for headaches, focal weakness or numbness.    ____________________________________________   PHYSICAL EXAM:  VITAL  SIGNS: ED Triage Vitals  Enc Vitals Group     BP 04/27/15 1747 121/71 mmHg     Pulse Rate 04/27/15 1747 86     Resp 04/27/15 1747 16     Temp 04/27/15 1747 97.6 F (36.4 C)     Temp Source 04/27/15 1747 Oral     SpO2 04/27/15 1747 97 %     Weight 04/27/15 1747 150 lb (68.04 kg)     Height 04/27/15 1747  (1.499 m)     Head Cir --      Peak Flow --      Pain Score 04/27/15 1748 10     Pain Loc --      Pain Edu? --      Excl. in GC? --     Constitutional: Alert and oriented. Well appearing and in no acute distress. Eyes: Conjunctivae are normal. PERRL. EOMI. Head: Atraumatic. Nose: No congestion/rhinnorhea. Mouth/Throat: Mucous membranes are moist.  Oropharynx non-erythematous. Fractured tooth 14. Neck: No stridor.  No cervical spine tenderness to palpation. Hematological/Lymphatic/Immunilogical: No cervical lymphadenopathy. Cardiovascular: Normal rate, regular rhythm. Grossly normal heart sounds.  Good peripheral circulation. Respiratory: Normal respiratory effort.  No retractions. Lungs CTAB. Gastrointestinal: Soft and nontender. No distention. No abdominal bruits. No CVA tenderness. Musculoskeletal: No lower extremity tenderness nor edema.  No joint effusions. Neurologic:  Normal speech and language. No gross focal neurologic deficits are appreciated. No gait instability. Skin:  Skin is warm, dry and intact. No rash noted. Psychiatric: Mood and affect are normal. Speech and behavior are normal.  ____________________________________________   LABS (all labs ordered are listed, but only abnormal results are displayed)  Labs Reviewed - No data to display ____________________________________________  EKG   ____________________________________________  RADIOLOGY   ____________________________________________   PROCEDURES  Procedure(s) performed: None  Critical Care performed: No  ____________________________________________   INITIAL IMPRESSION /  ASSESSMENT AND PLAN / ED COURSE  Pertinent labs & imaging results that were available during my care of the patient were reviewed by me and considered in my medical decision making (see chart for details).  Dental pain secondary to fractured tooth. Patient given a list of dental clinics and advised follow-up as soon as possible. Patient given prescription for tramadol and ibuprofen. ____________________________________________   FINAL CLINICAL IMPRESSION(S) / ED DIAGNOSES  Final diagnoses:  Chronic dental pain      Joni Reining, PA-C 04/27/15 1840  Minna Antis, MD 04/27/15 2252

## 2015-05-17 ENCOUNTER — Emergency Department
Admission: EM | Admit: 2015-05-17 | Discharge: 2015-05-17 | Disposition: A | Discharge status: Home / Self Care | Payer: Medicaid Other | Attending: Emergency Medicine | Admitting: Emergency Medicine

## 2015-05-17 ENCOUNTER — Encounter: Payer: Self-pay | Admitting: Emergency Medicine

## 2015-05-17 DIAGNOSIS — R112 Nausea with vomiting, unspecified: Secondary | ICD-10-CM

## 2015-05-17 DIAGNOSIS — J45909 Unspecified asthma, uncomplicated: Secondary | ICD-10-CM | POA: Insufficient documentation

## 2015-05-17 DIAGNOSIS — Z7951 Long term (current) use of inhaled steroids: Secondary | ICD-10-CM | POA: Insufficient documentation

## 2015-05-17 DIAGNOSIS — Z79899 Other long term (current) drug therapy: Secondary | ICD-10-CM | POA: Insufficient documentation

## 2015-05-17 LAB — COMPREHENSIVE METABOLIC PANEL
ALK PHOS: 56 U/L (ref 38–126)
ALT: 16 U/L (ref 14–54)
AST: 19 U/L (ref 15–41)
Albumin: 4.3 g/dL (ref 3.5–5.0)
Anion gap: 5 (ref 5–15)
BUN: 16 mg/dL (ref 6–20)
CALCIUM: 9 mg/dL (ref 8.9–10.3)
CHLORIDE: 107 mmol/L (ref 101–111)
CO2: 26 mmol/L (ref 22–32)
CREATININE: 0.8 mg/dL (ref 0.44–1.00)
GFR calc Af Amer: >60 mL/min (ref >60)
GFR calc non Af Amer: >60 mL/min (ref >60)
Glucose, Bld: 101 mg/dL — ABNORMAL HIGH (ref 65–99)
Potassium: 3.5 mmol/L (ref 3.5–5.1)
SODIUM: 138 mmol/L (ref 135–145)
Total Bilirubin: 0.3 mg/dL (ref 0.3–1.2)
Total Protein: 7.5 g/dL (ref 6.5–8.1)

## 2015-05-17 LAB — LIPASE, BLOOD: LIPASE: 20 U/L (ref 11–51)

## 2015-05-17 LAB — URINALYSIS COMPLETE WITH MICROSCOPIC (ARMC ONLY)
Bacteria, UA: NONE SEEN
Bilirubin Urine: NEGATIVE
GLUCOSE, UA: NEGATIVE mg/dL
Leukocytes, UA: NEGATIVE
Nitrite: NEGATIVE
PH: 5 (ref 5.0–8.0)
Protein, ur: 30 mg/dL — AB
Specific Gravity, Urine: 1.03 (ref 1.005–1.030)

## 2015-05-17 LAB — CBC
HCT: 39.9 % (ref 35.0–47.0)
Hemoglobin: 13.5 g/dL (ref 12.0–16.0)
MCH: 29.2 pg (ref 26.0–34.0)
MCHC: 33.9 g/dL (ref 32.0–36.0)
MCV: 86 fL (ref 80.0–100.0)
PLATELETS: 216 10*3/uL (ref 150–440)
RBC: 4.64 MIL/uL (ref 3.80–5.20)
RDW: 13.2 % (ref 11.5–14.5)
WBC: 9.6 10*3/uL (ref 3.6–11.0)

## 2015-05-17 LAB — POCT PREGNANCY, URINE: Preg Test, Ur: NEGATIVE

## 2015-05-17 MED ORDER — SODIUM CHLORIDE 0.9 % IV BOLUS (SEPSIS)
1000.0000 mL | Freq: Once | INTRAVENOUS | Status: AC
  Administered 2015-05-17: 1000 mL via INTRAVENOUS

## 2015-05-17 MED ORDER — ONDANSETRON HCL 4 MG PO TABS: 4.0000 mg | ORAL_TABLET | Freq: Three times a day (TID) | ORAL | Status: DC | PRN

## 2015-05-17 MED ORDER — ONDANSETRON 4 MG PO TBDP
4.0000 mg | ORAL_TABLET | Freq: Once | ORAL | Status: AC | PRN
  Administered 2015-05-17: 4 mg via ORAL
  Filled 2015-05-17: qty 1

## 2015-05-17 NOTE — Discharge Instructions (Signed)
Nausea and Vomiting  Nausea means you feel sick to your stomach. Throwing up (vomiting) is a reflex where stomach contents come out of your mouth.  HOME CARE   · Take medicine as told by your doctor.  · Do not force yourself to eat. However, you do need to drink fluids.  · If you feel like eating, eat a normal diet as told by your doctor.    Eat rice, wheat, potatoes, bread, lean meats, yogurt, fruits, and vegetables.    Avoid high-fat foods.  · Drink enough fluids to keep your pee (urine) clear or pale yellow.  · Ask your doctor how to replace body fluid losses (rehydrate). Signs of body fluid loss (dehydration) include:    Feeling very thirsty.    Dry lips and mouth.    Feeling dizzy.    Dark pee.    Peeing less than normal.    Feeling confused.    Fast breathing or heart rate.  GET HELP RIGHT AWAY IF:   · You have blood in your throw up.  · You have black or bloody poop (stool).  · You have a bad headache or stiff neck.  · You feel confused.  · You have bad belly (abdominal) pain.  · You have chest pain or trouble breathing.  · You do not pee at least once every 8 hours.  · You have cold, clammy skin.  · You keep throwing up after 24 to 48 hours.  · You have a fever.  MAKE SURE YOU:   · Understand these instructions.  · Will watch your condition.  · Will get help right away if you are not doing well or get worse.     This information is not intended to replace advice given to you by your health care provider. Make sure you discuss any questions you have with your health care provider.     Document Released: 08/04/2007 Document Revised: 05/10/2011 Document Reviewed: 07/17/2010  Elsevier Interactive Patient Education ©2016 [employer]

## 2015-05-17 NOTE — ED Notes (Signed)
Patient presents to the ED with nausea x 2 days and vomiting x 1 in the past 24 hours.  Patient reports body aches and chills.  Patient is afebrile at this time.  Patient denies diarrhea.  Denies abdominal pain.  Patient reports drinking a small amount of Mountain Dew 30 minutes ago and being able to keep it down.

## 2015-05-17 NOTE — ED Provider Notes (Signed)
[facility] Emergency Department Provider Note  ____________________________________________  Time seen: Approximately 4:53 PM  I have reviewed the triage vital signs and the nursing notes.   HISTORY  Chief Complaint Nausea; Emesis; and Generalized Body Aches    HPI Stephanie MassonCierra Shannon is a 20 y.o. female states nausea and vomiting for 2 days. Patient stated prior to arrival she was unable to keep fluids down patient also complaining of bilateral leg cramps. Patient also state body aches and chills. Patient denies any diarrhea. Patient denies any URI signs symptoms. Patient denies any abdominal pain. Patient rates her pain discomfort as a 6/10. Patient described discomfort pain as "achy". Patient states she blew her malaise Is secondary to repetitive squatting which is required at work. No palliative measures prior to arrival patient was given Zofran in triage.   Past Medical History  Diagnosis Date  . Peptic ulcer disease   . Nausea   . Abdominal pain   . Asthma     Patient Active Problem List   Diagnosis Date Noted  . Nausea with vomiting   . RUQ abdominal pain 07/21/2011  . Diarrhea 07/21/2011  . Excessive gas 07/21/2011  . Hematemesis     Past Surgical History  Procedure Laterality Date  . Tonsillectomy      Current Outpatient Rx  Name  Route  Sig  Dispense  Refill  . amoxicillin (AMOXIL) 500 MG tablet   Oral   Take 1 tablet (500 mg total) by mouth 2 (two) times daily.   20 tablet   0   . beclomethasone (QVAR) 40 MCG/ACT inhaler   Inhalation   Inhale 2 puffs into the lungs 2 (two) times daily.         Marland Kitchen. dicyclomine (BENTYL) 20 MG tablet   Oral   Take 1 tablet (20 mg total) by mouth every 6 (six) hours as needed for spasms (for abdominal cramping).   20 tablet   0   . famotidine (PEPCID) 20 MG tablet   Oral   Take 1 tablet (20 mg total) by mouth 2 (two) times daily.   30 tablet   0   . HYDROcodone-acetaminophen (NORCO) 5-325 MG  tablet   Oral   Take 1-2 tablets by mouth every 4 (four) hours as needed for moderate pain.   15 tablet   0   . ibuprofen (ADVIL,MOTRIN) 50 MG chewable tablet   Oral   Chew 1 tablet (50 mg total) by mouth every 8 (eight) hours as needed for fever.   24 tablet   2   . ibuprofen (ADVIL,MOTRIN) 800 MG tablet   Oral   Take 1 tablet (800 mg total) by mouth every 8 (eight) hours as needed.   30 tablet   0   . lamoTRIgine (LAMICTAL) 25 MG tablet   Oral   Take 25 mg by mouth 2 (two) times daily.         Marland Kitchen. lidocaine (XYLOCAINE) 2 % solution   Mouth/Throat   Use as directed 10 mLs in the mouth or throat as needed for mouth pain (dental pain).   100 mL   0   . ondansetron (ZOFRAN) 4 MG tablet   Oral   Take 1 tablet (4 mg total) by mouth every 8 (eight) hours as needed for nausea or vomiting.   12 tablet   0   . sertraline (ZOLOFT) 50 MG tablet   Oral   Take 50 mg by mouth daily.         .Marland Kitchen  sucralfate (CARAFATE) 1 G tablet   Oral   Take 1 tablet (1 g total) by mouth 4 (four) times daily.   30 tablet   0   . traMADol (ULTRAM) 50 MG tablet   Oral   Take 1 tablet (50 mg total) by mouth every 6 (six) hours as needed.   20 tablet   0   . traMADol (ULTRAM) 50 MG tablet   Oral   Take 1 tablet (50 mg total) by mouth every 6 (six) hours as needed for moderate pain.   12 tablet   0     Allergies Metformin and related and Sprintec 28  Family History  Problem Relation Age of Onset  . Cholelithiasis Mother   . SIDS Brother   . Cholelithiasis Maternal Grandmother   . Ulcers Maternal Grandmother   . Celiac disease Neg Hx     Social History Social History  Substance Use Topics  . Smoking status: Current Every Day Smoker -- 1.00 packs/day    Types: Cigarettes  . Smokeless tobacco: Never Used  . Alcohol Use: No    Review of Systems Constitutional: No fever/chills. Body aches.  Eyes: No visual changes. ENT: No sore throat. Cardiovascular: Denies chest  pain. Respiratory: Denies shortness of breath. Gastrointestinal: No abdominal pain.  Nausea and vomiting.  No diarrhea.  No constipation. Genitourinary: Negative for dysuria. Musculoskeletal: Bilateral leg cramps  Skin: Negative for rash. Neurological: Negative for headaches, focal weakness or numbness.    ____________________________________________   PHYSICAL EXAM:  VITAL SIGNS: ED Triage Vitals  Enc Vitals Group     BP 05/17/15 1541 136/72 mmHg     Pulse Rate 05/17/15 1541 113     Resp 05/17/15 1541 17     Temp 05/17/15 1541 98.3 F (36.8 C)     Temp Source 05/17/15 1541 Oral     SpO2 05/17/15 1541 97 %     Weight 05/17/15 1541 150 lb (68.04 kg)     Height 05/17/15 1541  (1.499 m)     Head Cir --      Peak Flow --      Pain Score 05/17/15 1544 6     Pain Loc --      Pain Edu? --      Excl. in GC? --     Constitutional: Alert and oriented. Well appearing and in no acute distress. Eyes: Conjunctivae are normal. PERRL. EOMI. Head: Atraumatic. Nose: No congestion/rhinnorhea. Mouth/Throat: Mucous membranes areDry.  Oropharynx non-erythematous. Neck: No stridor. No cervical spine tenderness to palpation. Hematological/Lymphatic/Immunilogical: No cervical lymphadenopathy. Cardiovascular: Normal rate, regular rhythm. Grossly normal heart sounds.  Good peripheral circulation. Tachycardic Respiratory: Normal respiratory effort.  No retractions. Lungs CTAB. Gastrointestinal: Soft and nontender. No distention. No abdominal bruits. No CVA tenderness. Musculoskeletal: No lower extremity tenderness nor edema.  No joint effusions. Neurologic:  Normal speech and language. No gross focal neurologic deficits are appreciated. No gait instability. Skin:  Skin is warm, dry and intact. No rash noted. Psychiatric: Mood and affect are normal. Speech and behavior are normal.  ____________________________________________   LABS (all labs ordered are listed, but only abnormal  results are displayed)  Labs Reviewed  COMPREHENSIVE METABOLIC PANEL - Abnormal; Notable for the following:    Glucose, Bld 101 (*)    All other components within normal limits  URINALYSIS COMPLETEWITH MICROSCOPIC ([facility] ONLY) - Abnormal; Notable for the following:    Color, Urine YELLOW (*)    APPearance HAZY (*)  Ketones, ur 1+ (*)    Hgb urine dipstick 1+ (*)    Protein, ur 30 (*)    Squamous Epithelial / LPF 0-5 (*)    All other components within normal limits  LIPASE, BLOOD  CBC  POCT PREGNANCY, URINE   ____________________________________________  EKG   ____________________________________________  RADIOLOGY   ____________________________________________   PROCEDURES  Procedure(s) performed: None  Critical Care performed: No  ____________________________________________   INITIAL IMPRESSION / ASSESSMENT AND PLAN / ED COURSE  Pertinent labs & imaging results that were available during my care of the patient were reviewed by me and considered in my medical decision making (see chart for details).  Dehydration secondary to nausea and vomiting. Discussed lab results with patient. Patient rehydrated with 1 L of normal saline. Patient given discharge Instructions. Patient given a work note. Advised to follow-up with family doctor no improvement in 48 hours. ____________________________________________   FINAL CLINICAL IMPRESSION(S) / ED DIAGNOSES  Final diagnoses:  Non-pregnancy nausea and vomiting      Joni Reining, PA-C 05/17/15 1707  Sharyn Creamer, MD 05/17/15 2230

## 2015-09-05 ENCOUNTER — Encounter: Payer: Self-pay | Admitting: Emergency Medicine

## 2015-09-05 ENCOUNTER — Emergency Department
Admission: EM | Admit: 2015-09-05 | Discharge: 2015-09-06 | Disposition: A | Discharge status: Home / Self Care | Payer: Medicaid Other | Attending: Emergency Medicine | Admitting: Emergency Medicine

## 2015-09-05 DIAGNOSIS — F17210 Nicotine dependence, cigarettes, uncomplicated: Secondary | ICD-10-CM | POA: Diagnosis not present

## 2015-09-05 DIAGNOSIS — Z79899 Other long term (current) drug therapy: Secondary | ICD-10-CM | POA: Insufficient documentation

## 2015-09-05 DIAGNOSIS — J45909 Unspecified asthma, uncomplicated: Secondary | ICD-10-CM | POA: Insufficient documentation

## 2015-09-05 DIAGNOSIS — M545 Low back pain, unspecified: Secondary | ICD-10-CM

## 2015-09-05 LAB — BASIC METABOLIC PANEL
Anion gap: 5 (ref 5–15)
BUN: 7 mg/dL (ref 6–20)
CALCIUM: 8.6 mg/dL — AB (ref 8.9–10.3)
CO2: 27 mmol/L (ref 22–32)
Chloride: 105 mmol/L (ref 101–111)
Creatinine, Ser: 0.81 mg/dL (ref 0.44–1.00)
GFR calc Af Amer: >60 mL/min (ref >60)
GLUCOSE: 92 mg/dL (ref 65–99)
Potassium: 3.6 mmol/L (ref 3.5–5.1)
Sodium: 137 mmol/L (ref 135–145)

## 2015-09-05 LAB — CBC WITH DIFFERENTIAL/PLATELET
BASOS PCT: 1 %
Basophils Absolute: 0 10*3/uL (ref 0–0.1)
EOS ABS: 0 10*3/uL (ref 0–0.7)
EOS PCT: 0 %
HCT: 40 % (ref 35.0–47.0)
HEMOGLOBIN: 13.9 g/dL (ref 12.0–16.0)
LYMPHS ABS: 1.1 10*3/uL (ref 1.0–3.6)
Lymphocytes Relative: 31 %
MCH: 30 pg (ref 26.0–34.0)
MCHC: 34.7 g/dL (ref 32.0–36.0)
MCV: 86.5 fL (ref 80.0–100.0)
MONOS PCT: 8 %
Monocytes Absolute: 0.3 10*3/uL (ref 0.2–0.9)
NEUTROS PCT: 60 %
Neutro Abs: 2.1 10*3/uL (ref 1.4–6.5)
PLATELETS: 175 10*3/uL (ref 150–440)
RBC: 4.62 MIL/uL (ref 3.80–5.20)
RDW: 13.5 % (ref 11.5–14.5)
WBC: 3.5 10*3/uL — AB (ref 3.6–11.0)

## 2015-09-05 LAB — URINALYSIS COMPLETE WITH MICROSCOPIC (ARMC ONLY)
BACTERIA UA: NONE SEEN
Bilirubin Urine: NEGATIVE
GLUCOSE, UA: NEGATIVE mg/dL
Ketones, ur: NEGATIVE mg/dL
LEUKOCYTES UA: NEGATIVE
Nitrite: NEGATIVE
PH: 5 (ref 5.0–8.0)
PROTEIN: NEGATIVE mg/dL
SPECIFIC GRAVITY, URINE: 1.028 (ref 1.005–1.030)

## 2015-09-05 LAB — PREGNANCY, URINE: Preg Test, Ur: NEGATIVE

## 2015-09-05 NOTE — ED Provider Notes (Signed)
[facility] Emergency Department Provider Note   ____________________________________________  Time seen: Approximately 11:22 PM  I have reviewed the triage vital signs and the nursing notes.   HISTORY  Chief Complaint Back Pain; Nausea; and Fever    HPI [name] is a 20 y.o. female who presents to the ED from home with a chief complaint of lower back pain. Patient has a history of back pain secondary to fractured vertebra when she was 11. States this feels similar. Onset of pain approximately 5 PM. Denies recent trauma, fall or injury. Patient also notes no lower abdominal pain with fever 100.4F associated with nausea only. Patient did not take any antipyretics prior to arrival. Currently only complains of low back pain. Denies associated streaming weakness, numbness/tingling, bowel or bladder incontinence. Denies chest pain, shortness of breath, vomiting, dysuria, diarrhea. Denies recent travel. Nothing makes her pain better. Movement makes her pain worse.   Past Medical History  Diagnosis Date  . Peptic ulcer disease   . Nausea   . Abdominal pain   . Asthma     Patient Active Problem List   Diagnosis Date Noted  . Nausea with vomiting   . RUQ abdominal pain 07/21/2011  . Diarrhea 07/21/2011  . Excessive gas 07/21/2011  . Hematemesis     Past Surgical History  Procedure Laterality Date  . Tonsillectomy      Current Outpatient Rx  Name  Route  Sig  Dispense  Refill  . amoxicillin (AMOXIL) 500 MG tablet   Oral   Take 1 tablet (500 mg total) by mouth 2 (two) times daily.   20 tablet   0   . beclomethasone (QVAR) 40 MCG/ACT inhaler   Inhalation   Inhale 2 puffs into the lungs 2 (two) times daily.         . cyclobenzaprine (FLEXERIL) 5 MG tablet      1 tablet every 8 hours as needed for muscle spasms   15 tablet   0   . dicyclomine (BENTYL) 20 MG tablet   Oral   Take 1 tablet (20 mg total) by mouth every 6 (six) hours as  needed for spasms (for abdominal cramping).   20 tablet   0   . famotidine (PEPCID) 20 MG tablet   Oral   Take 1 tablet (20 mg total) by mouth 2 (two) times daily.   30 tablet   0   . ibuprofen (ADVIL,MOTRIN) 800 MG tablet   Oral   Take 1 tablet (800 mg total) by mouth every 8 (eight) hours as needed for moderate pain.   15 tablet   0   . lamoTRIgine (LAMICTAL) 25 MG tablet   Oral   Take 25 mg by mouth 2 (two) times daily.         Marland Kitchen. lidocaine (XYLOCAINE) 2 % solution   Mouth/Throat   Use as directed 10 mLs in the mouth or throat as needed for mouth pain (dental pain).   100 mL   0   . ondansetron (ZOFRAN) 4 MG tablet   Oral   Take 1 tablet (4 mg total) by mouth every 8 (eight) hours as needed for nausea or vomiting.   12 tablet   0   . oxyCODONE-acetaminophen (ROXICET) 5-325 MG tablet   Oral   Take 1 tablet by mouth every 4 (four) hours as needed for severe pain.   15 tablet   0   . sertraline (ZOLOFT) 50 MG tablet  Oral   Take 50 mg by mouth daily.         . sucralfate (CARAFATE) 1 G tablet   Oral   Take 1 tablet (1 g total) by mouth 4 (four) times daily.   30 tablet   0     Allergies Metformin and related and Sprintec 28  Family History  Problem Relation Age of Onset  . Cholelithiasis Mother   . SIDS Brother   . Cholelithiasis Maternal Grandmother   . Ulcers Maternal Grandmother   . Celiac disease Neg Hx     Social History Social History  Substance Use Topics  . Smoking status: Current Every Day Smoker -- 1.00 packs/day    Types: Cigarettes  . Smokeless tobacco: Never Used  . Alcohol Use: No    Review of Systems  Constitutional: No fever/chills. Eyes: No visual changes. ENT: No sore throat. Cardiovascular: Denies chest pain. Respiratory: Denies shortness of breath. Gastrointestinal: Positive for abdominal pain.  Positive for nausea, no vomiting.  No diarrhea.  No constipation. Genitourinary: Negative for dysuria. Musculoskeletal:  Positive for back pain. Skin: Negative for rash. Neurological: Negative for headaches, focal weakness or numbness.  10-point ROS otherwise negative.  ____________________________________________   PHYSICAL EXAM:  VITAL SIGNS: ED Triage Vitals  Enc Vitals Group     BP 09/05/15 2252 118/72 mmHg     Pulse Rate 09/05/15 2252 113     Resp 09/05/15 2252 22     Temp 09/05/15 2252 98.8 F (37.1 C)     Temp Source 09/05/15 2252 Oral     SpO2 09/05/15 2252 96 %     Weight 09/05/15 2248 150 lb (68.04 kg)     Height 09/05/15 2248  (1.499 m)     Head Cir --      Peak Flow --      Pain Score 09/05/15 2248 10     Pain Loc --      Pain Edu? --      Excl. in GC? --     Constitutional: Alert and oriented. Well appearing and in no acute distress. Lying in prone position for comfort. Eyes: Conjunctivae are normal. PERRL. EOMI. Head: Atraumatic. Nose: No congestion/rhinnorhea. Mouth/Throat: Mucous membranes are moist.  Oropharynx non-erythematous. Neck: No stridor.  Cardiovascular: Normal rate, regular rhythm. Grossly normal heart sounds.  Good peripheral circulation. Respiratory: Normal respiratory effort.  No retractions. Lungs CTAB. Gastrointestinal: Soft and nontender. No distention. No abdominal bruits. No CVA tenderness. Musculoskeletal: No midline spinal tenderness to palpation. Bilateral paraspinal lumbar tenderness to palpation with muscle spasms. No lower extremity tenderness nor edema.  Negative straight leg raise. No joint effusions. Neurologic:  Normal speech and language. No gross focal neurologic deficits are appreciated.  Skin:  Skin is warm, dry and intact. No rash noted. Psychiatric: Mood and affect are normal. Speech and behavior are normal.  ____________________________________________   LABS (all labs ordered are listed, but only abnormal results are displayed)  Labs Reviewed  URINALYSIS COMPLETEWITH MICROSCOPIC ([facility] ONLY) - Abnormal; Notable for the  following:    Color, Urine YELLOW (*)    APPearance CLEAR (*)    Hgb urine dipstick 1+ (*)    Squamous Epithelial / LPF 0-5 (*)    All other components within normal limits  CBC WITH DIFFERENTIAL/PLATELET - Abnormal; Notable for the following:    WBC 3.5 (*)    All other components within normal limits  BASIC METABOLIC PANEL - Abnormal; Notable for the following:  Calcium 8.6 (*)    All other components within normal limits  URINE CULTURE  PREGNANCY, URINE   ____________________________________________  EKG  None ____________________________________________  RADIOLOGY  None ____________________________________________   PROCEDURES  Procedure(s) performed: None  Procedures  Critical Care performed: No  ____________________________________________   INITIAL IMPRESSION / ASSESSMENT AND PLAN / ED COURSE  Pertinent labs & imaging results that were available during my care of the patient were reviewed by me and considered in my medical decision making (see chart for details).  20 year old female with acute on chronic low back pain. Unknown etiology of fever or abdominal pain; patient currently denies and only complains of low back pain. Will add urine culture. Plan for NSAIDs, analgesia and muscle relaxer with orthopedics follow-up next week. Strict return precautions given. Patient and family member verbalize understanding and agree with plan of care. ____________________________________________   FINAL CLINICAL IMPRESSION(S) / ED DIAGNOSES  Final diagnoses:  Bilateral low back pain without sciatica      NEW MEDICATIONS STARTED DURING THIS VISIT:  New Prescriptions   CYCLOBENZAPRINE (FLEXERIL) 5 MG TABLET    1 tablet every 8 hours as needed for muscle spasms   IBUPROFEN (ADVIL,MOTRIN) 800 MG TABLET    Take 1 tablet (800 mg total) by mouth every 8 (eight) hours as needed for moderate pain.   OXYCODONE-ACETAMINOPHEN (ROXICET) 5-325 MG TABLET    Take 1 tablet by  mouth every 4 (four) hours as needed for severe pain.     Note:  This document was prepared using Dragon voice recognition software and may include unintentional dictation errors.    Irean HongJade J Manus Weedman, MD 09/06/15 [phone]

## 2015-09-05 NOTE — ED Notes (Addendum)
Pt presents to ED with lower abd pain and lower back pain. Pt reports having fever 100.6 at home today with severe nausea. Pt has hx of back pain with fx vertebrae when she was 11 and reports pain feels similar to original injury. No known injury. Denies urinary symptoms.

## 2015-09-06 MED ORDER — IBUPROFEN 800 MG PO TABS: 800.0000 mg | ORAL_TABLET | Freq: Three times a day (TID) | ORAL | Status: DC | PRN

## 2015-09-06 MED ORDER — OXYCODONE-ACETAMINOPHEN 5-325 MG PO TABS: 1.0000 | ORAL_TABLET | ORAL | Status: DC | PRN

## 2015-09-06 MED ORDER — CYCLOBENZAPRINE HCL 5 MG PO TABS: ORAL_TABLET | ORAL | Status: DC

## 2015-09-06 MED ORDER — OXYCODONE-ACETAMINOPHEN 5-325 MG PO TABS
1.0000 | ORAL_TABLET | Freq: Once | ORAL | Status: AC
  Administered 2015-09-06: 1 via ORAL
  Filled 2015-09-06: qty 1

## 2015-09-06 MED ORDER — KETOROLAC TROMETHAMINE 30 MG/ML IJ SOLN
60.0000 mg | Freq: Once | INTRAMUSCULAR | Status: AC
  Administered 2015-09-06: 60 mg via INTRAMUSCULAR
  Filled 2015-09-06: qty 2

## 2015-09-06 MED ORDER — CYCLOBENZAPRINE HCL 10 MG PO TABS
5.0000 mg | ORAL_TABLET | Freq: Once | ORAL | Status: AC
  Administered 2015-09-06: 5 mg via ORAL
  Filled 2015-09-06: qty 2

## 2015-09-06 NOTE — Discharge Instructions (Signed)
1. You will be called with any positive urine culture results. 2. You may take medicines as needed for pain and muscle spasms (Motrin/Percocet/Flexeril #15). 3. Apply moist heat to affected area several times daily. 4. Return to the ER for worsening symptoms, persistent vomiting, difficulty breathing or other concerns.  Back Pain, Adult Back pain is very common in adults.The cause of back pain is rarely dangerous and the pain often gets better over time.The cause of your back pain may not be known. Some common causes of back pain include:  Strain of the muscles or ligaments supporting the spine.  Wear and tear (degeneration) of the spinal disks.  Arthritis.  Direct injury to the back. For many people, back pain may return. Since back pain is rarely dangerous, most people can learn to manage this condition on their own. HOME CARE INSTRUCTIONS Watch your back pain for any changes. The following actions may help to lessen any discomfort you are feeling:  Remain active. It is stressful on your back to sit or stand in one place for long periods of time. Do not sit, drive, or stand in one place for more than 30 minutes at a time. Take short walks on even surfaces as soon as you are able.Try to increase the length of time you walk each day.  Exercise regularly as directed by your health care provider. Exercise helps your back heal faster. It also helps avoid future injury by keeping your muscles strong and flexible.  Do not stay in bed.Resting more than 1-2 days can delay your recovery.  Pay attention to your body when you bend and lift. The most comfortable positions are those that put less stress on your recovering back. Always use proper lifting techniques, including:  Bending your knees.  Keeping the load close to your body.  Avoiding twisting.  Find a comfortable position to sleep. Use a firm mattress and lie on your side with your knees slightly bent. If you lie on your back, put  a pillow under your knees.  Avoid feeling anxious or stressed.Stress increases muscle tension and can worsen back pain.It is important to recognize when you are anxious or stressed and learn ways to manage it, such as with exercise.  Take medicines only as directed by your health care provider. Over-the-counter medicines to reduce pain and inflammation are often the most helpful.Your health care provider may prescribe muscle relaxant drugs.These medicines help dull your pain so you can more quickly return to your normal activities and healthy exercise.  Apply ice to the injured area:  Put ice in a plastic bag.  Place a towel between your skin and the bag.  Leave the ice on for 20 minutes, 2-3 times a day for the first 2-3 days. After that, ice and heat may be alternated to reduce pain and spasms.  Maintain a healthy weight. Excess weight puts extra stress on your back and makes it difficult to maintain good posture. SEEK MEDICAL CARE IF:  You have pain that is not relieved with rest or medicine.  You have increasing pain going down into the legs or buttocks.  You have pain that does not improve in one week.  You have night pain.  You lose weight.  You have a fever or chills. SEEK IMMEDIATE MEDICAL CARE IF:   You develop new bowel or bladder control problems.  You have unusual weakness or numbness in your arms or legs.  You develop nausea or vomiting.  You develop abdominal pain.  You feel faint.   This information is not intended to replace advice given to you by your health care provider. Make sure you discuss any questions you have with your health care provider.   Document Released: 02/15/2005 Document Revised: 03/08/2014 Document Reviewed: 06/19/2013 Elsevier Interactive Patient Education 2016 [employer]  Musculoskeletal Pain Musculoskeletal pain is muscle and boney aches and pains. These pains can occur in any part of the body. Your caregiver may treat you  without knowing the cause of the pain. They may treat you if blood or urine tests, X-rays, and other tests were normal.  CAUSES There is often not a definite cause or reason for these pains. These pains may be caused by a type of germ (virus). The discomfort may also come from overuse. Overuse includes working out too hard when your body is not fit. Boney aches also come from weather changes. Bone is sensitive to atmospheric pressure changes. HOME CARE INSTRUCTIONS   Ask when your test results will be ready. Make sure you get your test results.  Only take over-the-counter or prescription medicines for pain, discomfort, or fever as directed by your caregiver. If you were given medications for your condition, do not drive, operate machinery or power tools, or sign legal documents for 24 hours. Do not drink alcohol. Do not take sleeping pills or other medications that may interfere with treatment.  Continue all activities unless the activities cause more pain. When the pain lessens, slowly resume normal activities. Gradually increase the intensity and duration of the activities or exercise.  During periods of severe pain, bed rest may be helpful. Lay or sit in any position that is comfortable.  Putting ice on the injured area.  Put ice in a bag.  Place a towel between your skin and the bag.  Leave the ice on for 15 to 20 minutes, 3 to 4 times a day.  Follow up with your caregiver for continued problems and no reason can be found for the pain. If the pain becomes worse or does not go away, it may be necessary to repeat tests or do additional testing. Your caregiver may need to look further for a possible cause. SEEK IMMEDIATE MEDICAL CARE IF:  You have pain that is getting worse and is not relieved by medications.  You develop chest pain that is associated with shortness or breath, sweating, feeling sick to your stomach (nauseous), or throw up (vomit).  Your pain becomes localized to the  abdomen.  You develop any new symptoms that seem different or that concern you. MAKE SURE YOU:   Understand these instructions.  Will watch your condition.  Will get help right away if you are not doing well or get worse.   This information is not intended to replace advice given to you by your health care provider. Make sure you discuss any questions you have with your health care provider.   Document Released: 02/15/2005 Document Revised: 05/10/2011 Document Reviewed: 10/20/2012 Elsevier Interactive Patient Education [employer]

## 2015-09-07 LAB — URINE CULTURE: Special Requests: NORMAL

## 2016-01-06 ENCOUNTER — Emergency Department
Admission: EM | Admit: 2016-01-06 | Discharge: 2016-01-06 | Disposition: A | Discharge status: Home / Self Care | Payer: Medicaid Other | Attending: Emergency Medicine | Admitting: Emergency Medicine

## 2016-01-06 ENCOUNTER — Emergency Department: Payer: Medicaid Other

## 2016-01-06 ENCOUNTER — Encounter: Payer: Self-pay | Admitting: Emergency Medicine

## 2016-01-06 DIAGNOSIS — Z791 Long term (current) use of non-steroidal anti-inflammatories (NSAID): Secondary | ICD-10-CM | POA: Diagnosis not present

## 2016-01-06 DIAGNOSIS — Z792 Long term (current) use of antibiotics: Secondary | ICD-10-CM | POA: Diagnosis not present

## 2016-01-06 DIAGNOSIS — F17210 Nicotine dependence, cigarettes, uncomplicated: Secondary | ICD-10-CM | POA: Insufficient documentation

## 2016-01-06 DIAGNOSIS — R109 Unspecified abdominal pain: Secondary | ICD-10-CM

## 2016-01-06 DIAGNOSIS — R1032 Left lower quadrant pain: Secondary | ICD-10-CM | POA: Diagnosis present

## 2016-01-06 DIAGNOSIS — Z79899 Other long term (current) drug therapy: Secondary | ICD-10-CM | POA: Diagnosis not present

## 2016-01-06 DIAGNOSIS — J45909 Unspecified asthma, uncomplicated: Secondary | ICD-10-CM | POA: Insufficient documentation

## 2016-01-06 LAB — URINALYSIS COMPLETE WITH MICROSCOPIC (ARMC ONLY)
Bacteria, UA: NONE SEEN
Bilirubin Urine: NEGATIVE
Glucose, UA: NEGATIVE mg/dL
Hgb urine dipstick: NEGATIVE
KETONES UR: NEGATIVE mg/dL
LEUKOCYTES UA: NEGATIVE
NITRITE: NEGATIVE
PROTEIN: NEGATIVE mg/dL
RBC / HPF: NONE SEEN RBC/hpf (ref 0–5)
SPECIFIC GRAVITY, URINE: 1.021 (ref 1.005–1.030)
pH: 6 (ref 5.0–8.0)

## 2016-01-06 LAB — BASIC METABOLIC PANEL
ANION GAP: 6 (ref 5–15)
BUN: 12 mg/dL (ref 6–20)
CHLORIDE: 106 mmol/L (ref 101–111)
CO2: 30 mmol/L (ref 22–32)
CREATININE: 0.93 mg/dL (ref 0.44–1.00)
Calcium: 8.8 mg/dL — ABNORMAL LOW (ref 8.9–10.3)
GFR calc non Af Amer: >60 mL/min (ref >60)
Glucose, Bld: 107 mg/dL — ABNORMAL HIGH (ref 65–99)
POTASSIUM: 4 mmol/L (ref 3.5–5.1)
SODIUM: 142 mmol/L (ref 135–145)

## 2016-01-06 LAB — CBC
HCT: 42.7 % (ref 35.0–47.0)
HEMOGLOBIN: 14.2 g/dL (ref 12.0–16.0)
MCH: 29.1 pg (ref 26.0–34.0)
MCHC: 33.1 g/dL (ref 32.0–36.0)
MCV: 88 fL (ref 80.0–100.0)
Platelets: 217 10*3/uL (ref 150–440)
RBC: 4.86 MIL/uL (ref 3.80–5.20)
RDW: 13.2 % (ref 11.5–14.5)
WBC: 9.4 10*3/uL (ref 3.6–11.0)

## 2016-01-06 LAB — POCT PREGNANCY, URINE: Preg Test, Ur: NEGATIVE

## 2016-01-06 MED ORDER — ETODOLAC 200 MG PO CAPS: 200.0000 mg | ORAL_CAPSULE | Freq: Three times a day (TID) | ORAL | 0 refills | Status: DC

## 2016-01-06 NOTE — ED Provider Notes (Signed)
[facility] Emergency Department Provider Note   ____________________________________________   First MD Initiated Contact with Patient 01/06/16 0451     (approximate)  I have reviewed the triage vital signs and the nursing notes.   HISTORY  Chief Complaint Flank Pain    HPI [name] is a 20 y.o. female who comes into the hospital today with some left lower quadrant abdominal pain. The patient reports that the pain started around midnight. She's never had this pain before. She denies pain in her back. She reports that she didn't take any medicine for her pain at home. The patient denies pain with urination. She is never had this before. Her last pressure. As 1-2 weeks ago. The patient denies any nausea or vomiting, dizziness, headache or lightheadedness. The patient rates her pain a 6 out of 10 in intensity. The patient has never had any pain with urination or vaginal discharge. The patient is concerned so she decided to come into the hospital tonight for evaluation.   Past Medical History:  Diagnosis Date  . Abdominal pain   . Asthma   . Nausea   . Peptic ulcer disease     Patient Active Problem List   Diagnosis Date Noted  . Nausea with vomiting   . RUQ abdominal pain 07/21/2011  . Diarrhea 07/21/2011  . Excessive gas 07/21/2011  . Hematemesis     Past Surgical History:  Procedure Laterality Date  . TONSILLECTOMY      Prior to Admission medications   Medication Sig Start Date End Date Taking? Authorizing Provider  amoxicillin (AMOXIL) 500 MG tablet Take 1 tablet (500 mg total) by mouth 2 (two) times daily. 01/18/15   Charmayne Sheerharles M Beers, PA-C  beclomethasone (QVAR) 40 MCG/ACT inhaler Inhale 2 puffs into the lungs 2 (two) times daily.    Historical Provider, MD  cyclobenzaprine (FLEXERIL) 5 MG tablet 1 tablet every 8 hours as needed for muscle spasms 09/06/15   Irean HongJade J Sung, MD  dicyclomine (BENTYL) 20 MG tablet Take 1 tablet (20 mg total)  by mouth every 6 (six) hours as needed for spasms (for abdominal cramping). 05/12/14   Marisa Severinlga Otter, MD  etodolac (LODINE) 200 MG capsule Take 1 capsule (200 mg total) by mouth every 8 (eight) hours. 01/06/16   Rebecka ApleyAllison P Webster, MD  famotidine (PEPCID) 20 MG tablet Take 1 tablet (20 mg total) by mouth 2 (two) times daily. 05/12/14   Marisa Severinlga Otter, MD  ibuprofen (ADVIL,MOTRIN) 800 MG tablet Take 1 tablet (800 mg total) by mouth every 8 (eight) hours as needed for moderate pain. 09/06/15   Irean HongJade J Sung, MD  lamoTRIgine (LAMICTAL) 25 MG tablet Take 25 mg by mouth 2 (two) times daily.    Historical Provider, MD  lidocaine (XYLOCAINE) 2 % solution Use as directed 10 mLs in the mouth or throat as needed for mouth pain (dental pain). 01/18/15   Charmayne Sheerharles M Beers, PA-C  ondansetron (ZOFRAN) 4 MG tablet Take 1 tablet (4 mg total) by mouth every 8 (eight) hours as needed for nausea or vomiting. 05/17/15 05/16/16  Joni Reiningonald K Smith, PA-C  oxyCODONE-acetaminophen (ROXICET) 5-325 MG tablet Take 1 tablet by mouth every 4 (four) hours as needed for severe pain. 09/06/15   Irean HongJade J Sung, MD  sertraline (ZOLOFT) 50 MG tablet Take 50 mg by mouth daily.    Historical Provider, MD  sucralfate (CARAFATE) 1 G tablet Take 1 tablet (1 g total) by mouth 4 (four) times daily. 05/12/14  Marisa Severin, MD    Allergies Metformin and related and Sprintec 28 [norgestimate-eth estradiol]  Family History  Problem Relation Age of Onset  . Cholelithiasis Mother   . SIDS Brother   . Cholelithiasis Maternal Grandmother   . Ulcers Maternal Grandmother   . Celiac disease Neg Hx     Social History Social History  Substance Use Topics  . Smoking status: Current Every Day Smoker    Packs/day: 1.00    Types: Cigarettes  . Smokeless tobacco: Never Used  . Alcohol use No    Review of Systems Constitutional: No fever/chills Eyes: No visual changes. ENT: No sore throat. Cardiovascular: Denies chest pain. Respiratory: Denies shortness of  breath. Gastrointestinal:  abdominal pain.  No nausea, no vomiting.  No diarrhea.  No constipation. Genitourinary: Negative for dysuria. Musculoskeletal: Negative for back pain. Skin: Negative for rash. Neurological: Negative for headaches, focal weakness or numbness.  10-point ROS otherwise negative.  ____________________________________________   PHYSICAL EXAM:  VITAL SIGNS: ED Triage Vitals  Enc Vitals Group     BP 01/06/16 0156 126/74     Pulse Rate 01/06/16 0156 (!) 107     Resp 01/06/16 0156 20     Temp 01/06/16 0156 97.8 F (36.6 C)     Temp Source 01/06/16 0156 Oral     SpO2 01/06/16 0156 100 %     Weight 01/06/16 0154 150 lb (68 kg)     Height 01/06/16 0154 4\' 11"  (1.499 m)     Head Circumference --      Peak Flow --      Pain Score 01/06/16 0154 8     Pain Loc --      Pain Edu? --      Excl. in GC? --     Constitutional: Alert and oriented. Well appearing and in no acute distress. Eyes: Conjunctivae are normal. PERRL. EOMI. Head: Atraumatic. Nose: No congestion/rhinnorhea. Mouth/Throat: Mucous membranes are moist.  Oropharynx non-erythematous. Cardiovascular: Normal rate, regular rhythm. Grossly normal heart sounds.  Good peripheral circulation. Respiratory: Normal respiratory effort.  No retractions. Lungs CTAB. Gastrointestinal: Soft With some left lower quadrant tenderness to palpation. No distention. Positive bowel sounds Musculoskeletal: No lower extremity tenderness nor edema. Neurologic:  Normal speech and language.  Skin:  Skin is warm, dry and intact.  Psychiatric: Mood and affect are normal.   ____________________________________________   LABS (all labs ordered are listed, but only abnormal results are displayed)  Labs Reviewed  BASIC METABOLIC PANEL - Abnormal; Notable for the following:       Result Value   Glucose, Bld 107 (*)    Calcium 8.8 (*)    All other components within normal limits  URINALYSIS COMPLETEWITH MICROSCOPIC ([facility]  ONLY) - Abnormal; Notable for the following:    Color, Urine YELLOW (*)    APPearance CLEAR (*)    Squamous Epithelial / LPF 0-5 (*)    All other components within normal limits  CBC  POCT PREGNANCY, URINE   ____________________________________________  EKG  none ____________________________________________  RADIOLOGY  Pelvic ultrasound Renal ultrasound ____________________________________________   PROCEDURES  Procedure(s) performed: None  Procedures  Critical Care performed: No  ____________________________________________   INITIAL IMPRESSION / ASSESSMENT AND PLAN / ED COURSE  Pertinent labs & imaging results that were available during my care of the patient were reviewed by me and considered in my medical decision making (see chart for details).  This is a 20 year old female who comes into the hospital today with some left  lower quadrant pain. There was initial concern that it was side pain or flank pain but the patient denies any pain to her back. I will send the patient for an ultrasound of her pelvis to evaluate for cyst or ovarian pathology. The patient's blood work and urinalysis is unremarkable. I will reassess the patient once I received the results of her ultrasound.  Clinical Course as of Jan 06 724  Tue Jan 06, 2016  0700 Normal ultrasound appearance of the uterus and ovaries. No evidence of adnexal mass or ovarian torsion.   US Pelvis Complete [AW]  0700 Normal ultrasound appearance of the kidneys.  No hydronephrosis. US Renal [AW]    Clinical Course User Index [AW] Rebecka ApleyAllison P Webster, MD   The patient's ultrasound is unremarkable. She reports that her pain is improved but not fully gone. As the patient's blood work is negative as well as her imaging studies as noted the patient can be discharged home. The patient should follow-up with the primary care physician for further evaluation.  ____________________________________________   FINAL  CLINICAL IMPRESSION(S) / ED DIAGNOSES  Final diagnoses:  Abdominal pain  Left lower quadrant pain      NEW MEDICATIONS STARTED DURING THIS VISIT:  New Prescriptions   ETODOLAC (LODINE) 200 MG CAPSULE    Take 1 capsule (200 mg total) by mouth every 8 (eight) hours.     Note:  This document was prepared using Dragon voice recognition software and may include unintentional dictation errors.    Rebecka ApleyAllison P Webster, MD 01/06/16 [phone]

## 2016-01-06 NOTE — ED Notes (Signed)
Pt discharged home after verbalizing understanding of discharge instructions; nad noted. 

## 2016-01-06 NOTE — ED Triage Notes (Signed)
Patient ambulatory to triage with steady gait, without difficulty or distress noted; pt reports left side pain with no accomp symptoms x 2 hrs; denies hx of same

## 2016-02-02 ENCOUNTER — Emergency Department
Admission: EM | Admit: 2016-02-02 | Discharge: 2016-02-02 | Disposition: A | Discharge status: Home / Self Care | Payer: Medicaid Other | Attending: Emergency Medicine | Admitting: Emergency Medicine

## 2016-02-02 ENCOUNTER — Encounter: Payer: Self-pay | Admitting: Emergency Medicine

## 2016-02-02 DIAGNOSIS — J45909 Unspecified asthma, uncomplicated: Secondary | ICD-10-CM | POA: Diagnosis not present

## 2016-02-02 DIAGNOSIS — Z5321 Procedure and treatment not carried out due to patient leaving prior to being seen by health care provider: Secondary | ICD-10-CM | POA: Insufficient documentation

## 2016-02-02 DIAGNOSIS — F17210 Nicotine dependence, cigarettes, uncomplicated: Secondary | ICD-10-CM | POA: Insufficient documentation

## 2016-02-02 DIAGNOSIS — Z79899 Other long term (current) drug therapy: Secondary | ICD-10-CM | POA: Diagnosis not present

## 2016-02-02 DIAGNOSIS — R1030 Lower abdominal pain, unspecified: Secondary | ICD-10-CM | POA: Diagnosis not present

## 2016-02-02 DIAGNOSIS — Z791 Long term (current) use of non-steroidal anti-inflammatories (NSAID): Secondary | ICD-10-CM | POA: Diagnosis not present

## 2016-02-02 LAB — COMPREHENSIVE METABOLIC PANEL
ALBUMIN: 4.1 g/dL (ref 3.5–5.0)
ALT: 32 U/L (ref 14–54)
AST: 26 U/L (ref 15–41)
Alkaline Phosphatase: 62 U/L (ref 38–126)
Anion gap: 5 (ref 5–15)
BILIRUBIN TOTAL: 0.4 mg/dL (ref 0.3–1.2)
BUN: 5 mg/dL — AB (ref 6–20)
CALCIUM: 9 mg/dL (ref 8.9–10.3)
CO2: 30 mmol/L (ref 22–32)
CREATININE: 0.74 mg/dL (ref 0.44–1.00)
Chloride: 103 mmol/L (ref 101–111)
GFR calc Af Amer: >60 mL/min (ref >60)
GFR calc non Af Amer: >60 mL/min (ref >60)
GLUCOSE: 83 mg/dL (ref 65–99)
Potassium: 3.6 mmol/L (ref 3.5–5.1)
SODIUM: 138 mmol/L (ref 135–145)
Total Protein: 7.4 g/dL (ref 6.5–8.1)

## 2016-02-02 LAB — CBC WITH DIFFERENTIAL/PLATELET
BASOS PCT: 0 %
Basophils Absolute: 0 10*3/uL (ref 0–0.1)
EOS ABS: 0.1 10*3/uL (ref 0–0.7)
Eosinophils Relative: 1 %
HEMATOCRIT: 41.5 % (ref 35.0–47.0)
HEMOGLOBIN: 13.9 g/dL (ref 12.0–16.0)
Lymphocytes Relative: 32 %
Lymphs Abs: 2.9 10*3/uL (ref 1.0–3.6)
MCH: 29.3 pg (ref 26.0–34.0)
MCHC: 33.5 g/dL (ref 32.0–36.0)
MCV: 87.3 fL (ref 80.0–100.0)
Monocytes Absolute: 0.4 10*3/uL (ref 0.2–0.9)
Monocytes Relative: 5 %
NEUTROS ABS: 5.6 10*3/uL (ref 1.4–6.5)
NEUTROS PCT: 62 %
Platelets: 221 10*3/uL (ref 150–440)
RBC: 4.75 MIL/uL (ref 3.80–5.20)
RDW: 13.5 % (ref 11.5–14.5)
WBC: 9.1 10*3/uL (ref 3.6–11.0)

## 2016-02-02 LAB — LIPASE, BLOOD: Lipase: 22 U/L (ref 11–51)

## 2016-02-02 LAB — POCT PREGNANCY, URINE: Preg Test, Ur: NEGATIVE

## 2016-02-02 NOTE — ED Triage Notes (Signed)
Reports lower abd pain and vaginal discharge.  Denies n/v/d.

## 2016-04-14 ENCOUNTER — Encounter: Payer: Self-pay | Admitting: Emergency Medicine

## 2016-04-14 ENCOUNTER — Emergency Department
Admission: EM | Admit: 2016-04-14 | Discharge: 2016-04-14 | Disposition: A | Discharge status: Home / Self Care | Payer: Medicaid Other | Attending: Emergency Medicine | Admitting: Emergency Medicine

## 2016-04-14 DIAGNOSIS — Z3A08 8 weeks gestation of pregnancy: Secondary | ICD-10-CM | POA: Insufficient documentation

## 2016-04-14 DIAGNOSIS — N898 Other specified noninflammatory disorders of vagina: Secondary | ICD-10-CM | POA: Insufficient documentation

## 2016-04-14 DIAGNOSIS — O4690 Antepartum hemorrhage, unspecified, unspecified trimester: Secondary | ICD-10-CM

## 2016-04-14 DIAGNOSIS — Z87891 Personal history of nicotine dependence: Secondary | ICD-10-CM | POA: Insufficient documentation

## 2016-04-14 DIAGNOSIS — O209 Hemorrhage in early pregnancy, unspecified: Secondary | ICD-10-CM | POA: Diagnosis not present

## 2016-04-14 DIAGNOSIS — J45909 Unspecified asthma, uncomplicated: Secondary | ICD-10-CM | POA: Diagnosis not present

## 2016-04-14 LAB — ABO/RH: ABO/RH(D): O POS

## 2016-04-14 LAB — POCT PREGNANCY, URINE: PREG TEST UR: POSITIVE — AB

## 2016-04-14 LAB — HCG, QUANTITATIVE, PREGNANCY: hCG, Beta Chain, Quant, S: 96694 m[IU]/mL — ABNORMAL HIGH (ref <5)

## 2016-04-14 NOTE — ED Notes (Signed)
See triage note. Pt alert & oriented with NAD noted. Pt states she is having pink-tinged discharge and is approximately [redacted] weeks pregnant. Denies pain, cramping.

## 2016-04-14 NOTE — ED Provider Notes (Signed)
[facility] Emergency Department Provider Note  ____________________________________________  Time seen: Approximately 2:33 PM  I have reviewed the triage vital signs and the nursing notes.   HISTORY  Chief Complaint Vaginal Bleeding   HPI [name] is a 21 y.o. female G3P0 currently at estimated [redacted] weeks GA per LMP and no significant past medical history who presents for evaluation of vaginal bleeding.Patient reports 2 days of vaginal spotting, no large volume bleeding, no clots. She has not established care with OB/GYN yet. She does have an appointment with them tomorrow. She denies abdominal pain, dizziness, nausea or vomiting.  Past Medical History:  Diagnosis Date  . Abdominal pain   . Asthma   . Nausea   . Peptic ulcer disease     Patient Active Problem List   Diagnosis Date Noted  . Nausea with vomiting   . RUQ abdominal pain 07/21/2011  . Diarrhea 07/21/2011  . Excessive gas 07/21/2011  . Hematemesis     Past Surgical History:  Procedure Laterality Date  . TONSILLECTOMY      Prior to Admission medications   Medication Sig Start Date End Date Taking? Authorizing Provider  amoxicillin (AMOXIL) 500 MG tablet Take 1 tablet (500 mg total) by mouth 2 (two) times daily. 01/18/15   Charmayne Sheer Beers, PA-C  beclomethasone (QVAR) 40 MCG/ACT inhaler Inhale 2 puffs into the lungs 2 (two) times daily.    Historical Provider, MD  cyclobenzaprine (FLEXERIL) 5 MG tablet 1 tablet every 8 hours as needed for muscle spasms 09/06/15   Irean Hong, MD  dicyclomine (BENTYL) 20 MG tablet Take 1 tablet (20 mg total) by mouth every 6 (six) hours as needed for spasms (for abdominal cramping). 05/12/14   Marisa Severin, MD  etodolac (LODINE) 200 MG capsule Take 1 capsule (200 mg total) by mouth every 8 (eight) hours. 01/06/16   Rebecka Apley, MD  famotidine (PEPCID) 20 MG tablet Take 1 tablet (20 mg total) by mouth 2 (two) times daily. 05/12/14   Marisa Severin, MD    ibuprofen (ADVIL,MOTRIN) 800 MG tablet Take 1 tablet (800 mg total) by mouth every 8 (eight) hours as needed for moderate pain. 09/06/15   Irean Hong, MD  lamoTRIgine (LAMICTAL) 25 MG tablet Take 25 mg by mouth 2 (two) times daily.    Historical Provider, MD  lidocaine (XYLOCAINE) 2 % solution Use as directed 10 mLs in the mouth or throat as needed for mouth pain (dental pain). 01/18/15   Charmayne Sheer Beers, PA-C  ondansetron (ZOFRAN) 4 MG tablet Take 1 tablet (4 mg total) by mouth every 8 (eight) hours as needed for nausea or vomiting. 05/17/15 05/16/16  Joni Reining, PA-C  oxyCODONE-acetaminophen (ROXICET) 5-325 MG tablet Take 1 tablet by mouth every 4 (four) hours as needed for severe pain. 09/06/15   Irean Hong, MD  sertraline (ZOLOFT) 50 MG tablet Take 50 mg by mouth daily.    Historical Provider, MD  sucralfate (CARAFATE) 1 G tablet Take 1 tablet (1 g total) by mouth 4 (four) times daily. 05/12/14   Marisa Severin, MD    Allergies Metformin and related and Sprintec 28 [norgestimate-eth estradiol]  Family History  Problem Relation Age of Onset  . Cholelithiasis Mother   . SIDS Brother   . Cholelithiasis Maternal Grandmother   . Ulcers Maternal Grandmother   . Celiac disease Neg Hx     Social History Social History  Substance Use Topics  . Smoking status: Former  Smoker    Packs/day: 1.00    Types: Cigarettes    Quit date: 03/24/2016  . Smokeless tobacco: Never Used  . Alcohol use No    Review of Systems  Constitutional: Negative for fever. Eyes: Negative for visual changes. ENT: Negative for sore throat. Neck: No neck pain  Cardiovascular: Negative for chest pain. Respiratory: Negative for shortness of breath. Gastrointestinal: Negative for abdominal pain, vomiting or diarrhea. Genitourinary: Negative for dysuria. + vaginal bleeding Musculoskeletal: Negative for back pain. Skin: Negative for rash. Neurological: Negative for headaches, weakness or numbness. Psych: No SI or  HI  ____________________________________________   PHYSICAL EXAM:  VITAL SIGNS: ED Triage Vitals [04/14/16 1204]  Enc Vitals Group     BP 131/68     Pulse Rate 90     Resp 20     Temp 99.4 F (37.4 C)     Temp Source Oral     SpO2 100 %     Weight 161 lb (73 kg)     Height 4\' 11"  (1.499 m)     Head Circumference      Peak Flow      Pain Score      Pain Loc      Pain Edu?      Excl. in GC?     Constitutional: Alert and oriented. Well appearing and in no apparent distress. HEENT:      Head: Normocephalic and atraumatic.         Eyes: Conjunctivae are normal. Sclera is non-icteric. EOMI. PERRL      Mouth/Throat: Mucous membranes are moist.       Neck: Supple with no signs of meningismus. Cardiovascular: Regular rate and rhythm. No murmurs, gallops, or rubs. 2+ symmetrical distal pulses are present in all extremities. No JVD. Respiratory: Normal respiratory effort. Lungs are clear to auscultation bilaterally. No wheezes, crackles, or rhonchi.  Gastrointestinal: Soft, non tender, and non distended with positive bowel sounds. No rebound or guarding. Genitourinary: No CVA tenderness. Pelvic exam: Normal external genitalia, no rashes or lesions. Normal cervical mucus. Os closed. No cervical motion tenderness.  No uterine or adnexal tenderness.   Musculoskeletal: Nontender with normal range of motion in all extremities. No edema, cyanosis, or erythema of extremities. Neurologic: Normal speech and language. Face is symmetric. Moving all extremities. No gross focal neurologic deficits are appreciated. Skin: Skin is warm, dry and intact. No rash noted. Psychiatric: Mood and affect are normal. Speech and behavior are normal.  ____________________________________________   LABS (all labs ordered are listed, but only abnormal results are displayed)  Labs Reviewed  HCG, QUANTITATIVE, PREGNANCY - Abnormal; Notable for the following:       Result Value   hCG, Beta Chain, Quant, S  [id number] (*)    All other components within normal limits  POCT PREGNANCY, URINE - Abnormal; Notable for the following:    Preg Test, Ur POSITIVE (*)    All other components within normal limits  POC URINE PREG, ED  ABO/RH   ____________________________________________  EKG  none ____________________________________________  RADIOLOGY  none  ____________________________________________   PROCEDURES  Procedure(s) performed: None Procedures Critical Care performed:  None ____________________________________________   INITIAL IMPRESSION / ASSESSMENT AND PLAN / ED COURSE  21 y.o. female G3P0 currently at estimated [redacted] weeks GA per LMP and no significant past medical history who presents for evaluation of vaginal spotting. Will check ABO to eval for need of Rhogam. Normal VS and small amount of bleeding, no need for lab  work. No abdominal pain or ttp, therefore will hold off [place] for now.   Clinical Course as of Apr 14 1608  Wed Apr 14, 2016  1607 ABo O+, no indication for rhogam. Serial abdominal exams with no tenderness. Patient remains pain free. Cervix is closed. Will dc home with close f/u with OBGYN tomorrow. Recommended that she returns to the ER if she develops any abdominal pain.  [CV]    Clinical Course User Index [CV] Nita Sickle, MD    Pertinent labs & imaging results that were available during my care of the patient were reviewed by me and considered in my medical decision making (see chart for details).    ____________________________________________   FINAL CLINICAL IMPRESSION(S) / ED DIAGNOSES  Final diagnoses:  Vaginal bleeding in pregnancy      NEW MEDICATIONS STARTED DURING THIS VISIT:  New Prescriptions   No medications on file     Note:  This document was prepared using Dragon voice recognition software and may include unintentional dictation errors.    Nita Sickle, MD 04/14/16 [phone]

## 2016-04-14 NOTE — Discharge Instructions (Signed)
Return to the ER if you develop abdominal pain or if your bleeding becomes severe causing dizziness, chest pain or shortness of breath

## 2016-04-14 NOTE — ED Triage Notes (Signed)
Pt from home with vaginal bleeding since this morning. Pt states she is 8 "or more" weeks pregnant. She denies pain, cramping, clots. 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 This is 3rd pregnancy. Pt states she is having "discharge mixed with blood, so it is pink." Pt alert & oriented with NAD noted.

## 2016-04-15 ENCOUNTER — Ambulatory Visit (INDEPENDENT_AMBULATORY_CARE_PROVIDER_SITE_OTHER): Payer: Medicaid Other | Admitting: Obstetrics and Gynecology

## 2016-04-15 ENCOUNTER — Encounter: Payer: Self-pay | Admitting: Obstetrics and Gynecology

## 2016-04-15 VITALS — BP 109/74 | HR 90 | Ht 59.0 in | Wt 163.1 lb

## 2016-04-15 DIAGNOSIS — O200 Threatened abortion: Secondary | ICD-10-CM

## 2016-04-15 DIAGNOSIS — O2621 Pregnancy care for patient with recurrent pregnancy loss, first trimester: Secondary | ICD-10-CM

## 2016-04-15 NOTE — Progress Notes (Signed)
HPI:      Ms. [name] is a 21 y.o. G3P0020 who LMP was Patient's last menstrual period was 01/22/2016 (exact date).  Subjective: She presents today after being seen in the emergency department yesterday for bleeding and pregnancy. Her bleeding has resolved since yesterday. Her quantitative beta hCG was greater than 90,000. An ultrasound was not performed. Of significant note patient has had 2 prior first trimester losses without any successful pregnancies.    Hx: The following portions of the patient's history were reviewed and updated as appropriate:           She  has a past medical history of Abdominal pain; Asthma; Nausea; and Peptic ulcer disease. She  does not have any pertinent problems on file. She  has a past surgical history that includes Tonsillectomy. Her family history includes Cholelithiasis in her maternal grandmother and mother; Diabetes in her maternal grandfather; SIDS in her brother; Ulcers in her maternal grandmother. She  reports that she quit smoking about 3 weeks ago. Her smoking use included Cigarettes. She smoked 1.00 pack per day. She has never used smokeless tobacco. She reports that she does not drink alcohol or use drugs. She currently has no medications in their medication list.        ROS: Constitutional: Denied constitutional symptoms, night sweats, recent illness, fatigue, fever, insomnia and weight loss.  Eyes: Denied eye symptoms, eye pain, photophobia, vision change and visual disturbance.  Ears/Nose/Throat/Neck: Denied ear, nose, throat or neck symptoms, hearing loss, nasal discharge, sinus congestion and sore throat.  Cardiovascular: Denied cardiovascular symptoms, arrhythmia, chest pain/pressure, edema, exercise intolerance, orthopnea and palpitations.  Respiratory: Denied pulmonary symptoms, asthma, pleuritic pain, productive sputum, cough, dyspnea and wheezing.  Gastrointestinal: Denied, gastro-esophageal reflux, melena, nausea and vomiting.   Genitourinary: Denied genitourinary symptoms including symptomatic vaginal discharge, pelvic relaxation issues, and urinary complaints.  Musculoskeletal: Denied musculoskeletal symptoms, stiffness, swelling, muscle weakness and myalgia.  Dermatologic: Denied dermatology symptoms, rash and scar.  Neurologic: Denied neurology symptoms, dizziness, headache, neck pain and syncope.  Psychiatric: Denied psychiatric symptoms, anxiety and depression.  Endocrine: Denied endocrine symptoms including hot flashes and night sweats.   Meds: She currently has no medications in their medication list. She states that she is taking prenatal vitamins.  Objective: Vitals:   04/15/16 0834  BP: 109/74  Pulse: 90               Pelvic:   Vulva: Normal appearance.  No lesions.  Vagina: No lesions or abnormalities noted.  Support: Normal pelvic support.  Urethra No masses tenderness or scarring.  Meatus Normal size without lesions or prolapse.  Cervix: Normal appearance.  No lesions.  Anus: Normal exam.  No lesions.  Perineum: Normal exam.  No lesions.        Bimanual   Adnexae: No masses.  Non-tender to palpation.  Uterus: Enlarged. 6-7 wks Non-tender.  Mobile.  Mid to retroverted     Adnexae: No masses.  Non-tender to palpation.  Cul-de-sac: Negative for abnormality.  Adnexae: No masses.  Non-tender to palpation.         Pelvimetry   Diagonal: Reached.  Spines: Average.  Sacrum: Concave.  Pubic Arch: Normal.     Assessment: 1. Pregnancy with early threatened abortion   2. Two previous spontaneous abortions (SAB) affecting care of mother, antepartum, first trimester     She is not currently bleeding. Denies any abdominal pain or cramping.  Plan:  1.  Ultrasound is scheduled to confirm dating and viability.   2.  Prenatal care to begin after ultrasound confirmation.   F/U  Return in about 4 weeks (around 05/13/2016).  Elonda Huskyavid J. Daquawn Seelman, M.D. 04/15/2016 9:15 AM

## 2016-04-16 ENCOUNTER — Ambulatory Visit (INDEPENDENT_AMBULATORY_CARE_PROVIDER_SITE_OTHER): Payer: Medicaid Other | Admitting: Obstetrics and Gynecology

## 2016-04-16 ENCOUNTER — Other Ambulatory Visit: Payer: Self-pay | Admitting: Obstetrics and Gynecology

## 2016-04-16 ENCOUNTER — Other Ambulatory Visit (INDEPENDENT_AMBULATORY_CARE_PROVIDER_SITE_OTHER): Payer: Medicaid Other

## 2016-04-16 VITALS — BP 124/83 | HR 96 | Ht 59.0 in | Wt 162.5 lb

## 2016-04-16 DIAGNOSIS — O2621 Pregnancy care for patient with recurrent pregnancy loss, first trimester: Secondary | ICD-10-CM

## 2016-04-16 DIAGNOSIS — Z8489 Family history of other specified conditions: Secondary | ICD-10-CM

## 2016-04-16 DIAGNOSIS — Z369 Encounter for antenatal screening, unspecified: Secondary | ICD-10-CM

## 2016-04-16 DIAGNOSIS — Z3401 Encounter for supervision of normal first pregnancy, first trimester: Secondary | ICD-10-CM

## 2016-04-16 DIAGNOSIS — Z1389 Encounter for screening for other disorder: Secondary | ICD-10-CM

## 2016-04-16 DIAGNOSIS — Z113 Encounter for screening for infections with a predominantly sexual mode of transmission: Secondary | ICD-10-CM

## 2016-04-16 DIAGNOSIS — Z3481 Encounter for supervision of other normal pregnancy, first trimester: Secondary | ICD-10-CM

## 2016-04-16 DIAGNOSIS — E663 Overweight: Secondary | ICD-10-CM

## 2016-04-16 DIAGNOSIS — O209 Hemorrhage in early pregnancy, unspecified: Secondary | ICD-10-CM

## 2016-04-16 MED ORDER — DOXYLAMINE-PYRIDOXINE 10-10 MG PO TBEC: DELAYED_RELEASE_TABLET | ORAL | 1 refills | Status: DC

## 2016-04-16 NOTE — Progress Notes (Signed)
[name] presents for NOB nurse interview visit. Pregnancy confirmation done at ER 04/15/2016, UPT: positive, BHCG: 96,694. Pt was seen for vaginal bleeding. LMP: 01/22/2016 ([facility]). Ultrasound for dating and viability ordered for vaginal bleeding, FHX twins, hx multiple miscarriages.  G-3.  P-0020 . Pregnancy education material explained and given. No cats in the home. NOB labs ordered. TSH/HbgA1c due to Increased BMI. HIV labs and Drug screen were explained optional and she did not decline. Drug screen ordered. Pt has smoked MJ recently. She states it helps her eat because she is not hungry. Pt has now stopped x1wk.  PNV encouraged. Pt previously took Zofran for nausea but will try Diglegis. Has FHX fraternal twins, and fob twins and triplets. Pt states she was previously pre-diabetic and took Metformin (ALLERGY). She also has history of HTN about 3-4 yrs ago but did not take any medication for it. Genetic screening options discussed. Genetic testing: Ordered/Declined/Unsure.  Pt may discuss with provider. Pt. To follow up with provider in 3-4 weeks for NOB physical, providing ultrasound dates are +/- 7 days.   All questions answered. Ultrasound resulted in EGA: 7.3 wks., EDD: 12/01/2016.

## 2016-04-16 NOTE — Patient Instructions (Signed)
Pregnancy and Zika Virus Disease Introduction Zika virus disease, or Zika, is an illness that can spread to people from mosquitoes that carry the virus. It may also spread from person to person through infected body fluids. Zika first occurred in [place], but recently it has spread to new areas. The virus occurs in tropical climates. The location of Zika continues to change. Most people who become infected with Zika virus do not develop serious illness. However, Zika may cause birth defects in an unborn baby whose mother is infected with the virus. It may also increase the risk of miscarriage. What are the symptoms of Zika virus disease? In many cases, people who have been infected with Zika virus do not develop any symptoms. If symptoms appear, they usually start about a week after the person is infected. Symptoms are usually mild. They may include:  Fever.  Rash.  Red eyes.  Joint pain. How does Zika virus disease spread? The main way that Zika virus spreads is through the bite of a certain type of mosquito. Unlike most types of mosquitos, which bite only at night, the type of mosquito that carries Zika virus bites both at night and during the day. Zika virus can also spread through sexual contact, through a blood transfusion, and from a mother to her baby before or during birth. Once you have had Zika virus disease, it is unlikely that you will get it again. Can I pass Zika to my baby during pregnancy? Yes, Zika can pass from a mother to her baby before or during birth. What problems can Zika cause for my baby? A woman who is infected with Zika virus while pregnant is at risk of having her baby born with a condition in which the brain or head is smaller than expected (microcephaly). Babies who have microcephaly can have developmental delays, seizures, hearing problems, and vision problems. Having Zika virus disease during pregnancy can also increase the risk of miscarriage. How can Zika  virus disease be prevented? There is no vaccine to prevent Zika. The best way to prevent the disease is to avoid infected mosquitoes and avoid exposure to body fluids that can spread the virus. Avoid any possible exposure to Zika by taking the following precautions. For women and their sex partners:  Avoid traveling to high-risk areas. The locations where Zika is being reported change often. To identify high-risk areas, check the CDC travel website: www.cdc.gov/zika/geo/index.html  If you or your sex partner must travel to a high-risk area, talk with a health care provider before and after traveling.  Take all precautions to avoid mosquito bites if you live in, or travel to, any of the high-risk areas. Insect repellents are safe to use during pregnancy.  Ask your health care provider when it is safe to have sexual contact. For women:  If you are pregnant or trying to become pregnant, avoid sexual contact with persons who may have been exposed to Zika virus, persons who have possible symptoms of Zika, or persons whose history you are unsure about. If you choose to have sexual contact with someone who may have been exposed to Zika virus, use condoms correctly during the entire duration of sexual activity, every time. Do not share sexual devices, as you may be exposed to body fluids.  Ask your health care provider about when it is safe to attempt pregnancy after a possible exposure to Zika virus. What steps should I take to avoid mosquito bites? Take these steps to avoid mosquito bites when you   are in a high-risk area:  Wear loose clothing that covers your arms and legs.  Limit your outdoor activities.  Do not open windows unless they have window screens.  Sleep under mosquito nets.  Use insect repellent. The best insect repellents have:  DEET, picaridin, oil of lemon eucalyptus (OLE), or [id number] in them.  Higher amounts of an active ingredient in them.  Remember that insect repellents  are safe to use during pregnancy.  Do not use OLE on children who are younger than 3 years of age. Do not use insect repellent on babies who are younger than 2 months of age.  Cover your child's stroller with mosquito netting. Make sure the netting fits snugly and that any loose netting does not cover your child's mouth or nose. Do not use a blanket as a mosquito-protection cover.  Do not apply insect repellent underneath clothing.  If you are using sunscreen, apply the sunscreen before applying the insect repellent.  Treat clothing with permethrin. Do not apply permethrin directly to your skin. Follow label directions for safe use.  Get rid of standing water, where mosquitoes may reproduce. Standing water is often found in items such as buckets, bowls, animal food dishes, and flowerpots. When you return from traveling to any high-risk area, continue taking actions to protect yourself against mosquito bites for 3 weeks, even if you show no signs of illness. This will prevent spreading Zika virus to uninfected mosquitoes. What should I know about the sexual transmission of Zika? People can spread Zika to their sexual partners during vaginal, anal, or oral sex, or by sharing sexual devices. Many people with Zika do not develop symptoms, so a person could spread the disease without knowing that they are infected. The greatest risk is to women who are pregnant or who may become pregnant. Zika virus can live longer in semen than it can live in blood. Couples can prevent sexual transmission of the virus by:  Using condoms correctly during the entire duration of sexual activity, every time. This includes vaginal, anal, and oral sex.  Not sharing sexual devices. Sharing increases your risk of being exposed to body fluid from another person.  Avoiding all sexual activity until your health care provider says it is safe. Should I be tested for Zika virus? A sample of your blood can be tested for Zika  virus. A pregnant woman should be tested if she may have been exposed to the virus or if she has symptoms of Zika. She may also have additional tests done during her pregnancy, such ultrasound testing. Talk with your health care provider about which tests are recommended. This information is not intended to replace advice given to you by your health care provider. Make sure you discuss any questions you have with your health care provider. Document Released: 11/06/2014 Document Revised: 07/24/2015 Document Reviewed: 10/30/2014  2017 Elsevier Minor Illnesses and Medications in Pregnancy  Cold/Flu:  Sudafed for congestion- Robitussin (plain) for cough- Tylenol for discomfort.  Please follow the directions on the label.  Try not to take any more than needed.  OTC Saline nasal spray and air humidifier or cool-mist  Vaporizer to sooth nasal irritation and to loosen congestion.  It is also important to increase intake of non carbonated fluids, especially if you have a fever.  Constipation:  Colace-2 capsules at bedtime; Metamucil- follow directions on label; Senokot- 1 tablet at bedtime.  Any one of these medications can be used.  It is also very important to increase   fluids and fruits along with regular exercise.  If problem persists please call the office.  Diarrhea:  Kaopectate as directed on the label.  Eat a bland diet and increase fluids.  Avoid highly seasoned foods.  Headache:  Tylenol 1 or 2 tablets every 3-4 hours as needed  Indigestion:  Maalox, Mylanta, Tums or Rolaids- as directed on label.  Also try to eat small meals and avoid fatty, greasy or spicy foods.  Nausea with or without Vomiting:  Nausea in pregnancy is caused by increased levels of hormones in the body which influence the digestive system and cause irritation when stomach acids accumulate.  Symptoms usually subside after 1st trimester of pregnancy.  Try the following: 1. Keep saltines, graham crackers or dry toast by your bed to  eat upon awakening. 2. Don't let your stomach get empty.  Try to eat 5-6 small meals per day instead of 3 large ones. 3. Avoid greasy fatty or highly seasoned foods.  4. Take OTC Unisom 1 tablet at bed time along with OTC Vitamin B6 25-50 mg 3 times per day.    If nausea continues with vomiting and you are unable to keep down food and fluids you may need a prescription medication.  Please notify your provider.   Sore throat:  Chloraseptic spray, throat lozenges and or plain Tylenol.  Vaginal Yeast Infection:  OTC Monistat for 7 days as directed on label.  If symptoms do not resolve within a week notify provider.  If any of the above problems do not subside with recommended treatment please call the office for further assistance.   Do not take Aspirin, Advil, Motrin or Ibuprofen.  * * OTC= Over the counter Hyperemesis Gravidarum Hyperemesis gravidarum is a severe form of nausea and vomiting that happens during pregnancy. Hyperemesis is worse than morning sickness. It may cause you to have nausea or vomiting all day for many days. It may keep you from eating and drinking enough food and liquids. Hyperemesis usually occurs during the first half (the first 20 weeks) of pregnancy. It often goes away once a woman is in her second half of pregnancy. However, sometimes hyperemesis continues through an entire pregnancy. What are the causes? The cause of this condition is not known. It may be related to changes in chemicals (hormones) in the body during pregnancy, such as the high level of pregnancy hormone (human chorionic gonadotropin) or the increase in the female sex hormone (estrogen). What are the signs or symptoms? Symptoms of this condition include:  Severe nausea and vomiting.  Nausea that does not go away.  Vomiting that does not allow you to keep any food down.  Weight loss.  Body fluid loss (dehydration).  Having no desire to eat, or not liking food that you have previously  enjoyed. How is this diagnosed? This condition may be diagnosed based on:  A physical exam.  Your medical history.  Your symptoms.  Blood tests.  Urine tests. How is this treated? This condition may be managed with medicine. If medicines to do not help relieve nausea and vomiting, you may need to receive fluids through an IV tube at the hospital. Follow these instructions at home:  Take over-the-counter and prescription medicines only as told by your health care provider.  Avoid iron pills and multivitamins that contain iron for the first 3-4 months of pregnancy. If you take prescription iron pills, do not stop taking them unless your health care provider approves.  Take the following actions to help   prevent nausea and vomiting:  In the morning, before getting out of bed, try eating a couple of dry crackers or a piece of toast.  Avoid foods and smells that upset your stomach. Fatty and spicy foods may make nausea worse.  Eat 5-6 small meals a day.  Do not drink fluids while eating meals. Drink between meals.  Eat or suck on things that have ginger in them. Ginger can help relieve nausea.  Avoid food preparation. The smell of food can spoil your appetite or trigger nausea.  Follow instructions from your health care provider about eating or drinking restrictions.  For snacks, eat high-protein foods, such as cheese.  Keep all follow-up and pre-birth (prenatal) visits as told by your health care provider. This is important. Contact a health care provider if:  You have pain in your abdomen.  You have a severe headache.  You have vision problems.  You are losing weight. Get help right away if:  You cannot drink fluids without vomiting.  You vomit blood.  You have constant nausea and vomiting.  You are very weak.  You are very thirsty.  You feel dizzy.  You faint.  You have a fever or other symptoms that last for more than 2-3 days.  You have a fever and  your symptoms suddenly get worse. Summary  Hyperemesis gravidarum is a severe form of nausea and vomiting that happens during pregnancy.  Making some changes to your eating habits may help relieve nausea and vomiting.  This condition may be managed with medicine.  If medicines to do not help relieve nausea and vomiting, you may need to receive fluids through an IV tube at the hospital. This information is not intended to replace advice given to you by your health care provider. Make sure you discuss any questions you have with your health care provider. Document Released: 02/15/2005 Document Revised: 10/15/2015 Document Reviewed: 10/15/2015 Elsevier Interactive Patient Education  2017 [employer] First Trimester of Pregnancy The first trimester of pregnancy is from week 1 until the end of week 12 (months 1 through 3). During this time, your baby will begin to develop inside you. At 6-8 weeks, the eyes and face are formed, and the heartbeat can be seen on ultrasound. At the end of 12 weeks, all the baby's organs are formed. Prenatal care is all the medical care you receive before the birth of your baby. Make sure you get good prenatal care and follow all of your doctor's instructions. Follow these instructions at home: Medicines  Take medicine only as told by your doctor. Some medicines are safe and some are not during pregnancy.  Take your prenatal vitamins as told by your doctor.  Take medicine that helps you poop (stool softener) as needed if your doctor says it is okay. Diet  Eat regular, healthy meals.  Your doctor will tell you the amount of weight gain that is right for you.  Avoid raw meat and uncooked cheese.  If you feel sick to your stomach (nauseous) or throw up (vomit):  Eat 4 or 5 small meals a day instead of 3 large meals.  Try eating a few soda crackers.  Drink liquids between meals instead of during meals.  If you have a hard time pooping  (constipation):  Eat high-fiber foods like fresh vegetables, fruit, and whole grains.  Drink enough fluids to keep your pee (urine) clear or pale yellow. Activity and Exercise  Exercise only as told by your doctor. Stop exercising if   you have cramps or pain in your lower belly (abdomen) or low back.  Try to avoid standing for long periods of time. Move your legs often if you must stand in one place for a long time.  Avoid heavy lifting.  Wear low-heeled shoes. Sit and stand up straight.  You can have sex unless your doctor tells you not to. Relief of Pain or Discomfort  Wear a good support bra if your breasts are sore.  Take warm water baths (sitz baths) to soothe pain or discomfort caused by hemorrhoids. Use hemorrhoid cream if your doctor says it is okay.  Rest with your legs raised if you have leg cramps or low back pain.  Wear support hose if you have puffy, bulging veins (varicose veins) in your legs. Raise (elevate) your feet for 15 minutes, 3-4 times a day. Limit salt in your diet. Prenatal Care  Schedule your prenatal visits by the twelfth week of pregnancy.  Write down your questions. Take them to your prenatal visits.  Keep all your prenatal visits as told by your doctor. Safety  Wear your seat belt at all times when driving.  Make a list of emergency phone numbers. The list should include numbers for family, friends, the hospital, and police and fire departments. General Tips  Ask your doctor for a referral to a local prenatal class. Begin classes no later than at the start of month 6 of your pregnancy.  Ask for help if you need counseling or help with nutrition. Your doctor can give you advice or tell you where to go for help.  Do not use hot tubs, steam rooms, or saunas.  Do not douche or use tampons or scented sanitary pads.  Do not cross your legs for long periods of time.  Avoid litter boxes and soil used by cats.  Avoid all smoking, herbs, and  alcohol. Avoid drugs not approved by your doctor.  Do not use any tobacco products, including cigarettes, chewing tobacco, and electronic cigarettes. If you need help quitting, ask your doctor. You may get counseling or other support to help you quit.  Visit your dentist. At home, brush your teeth with a soft toothbrush. Be gentle when you floss. Get help if:  You are dizzy.  You have mild cramps or pressure in your lower belly.  You have a nagging pain in your belly area.  You continue to feel sick to your stomach, throw up, or have watery poop (diarrhea).  You have a bad smelling fluid coming from your vagina.  You have pain with peeing (urination).  You have increased puffiness (swelling) in your face, hands, legs, or ankles. Get help right away if:  You have a fever.  You are leaking fluid from your vagina.  You have spotting or bleeding from your vagina.  You have very bad belly cramping or pain.  You gain or lose weight rapidly.  You throw up blood. It may look like coffee grounds.  You are around people who have German measles, fifth disease, or chickenpox.  You have a very bad headache.  You have shortness of breath.  You have any kind of trauma, such as from a fall or a car accident. This information is not intended to replace advice given to you by your health care provider. Make sure you discuss any questions you have with your health care provider. Document Released: 08/04/2007 Document Revised: 07/24/2015 Document Reviewed: 12/26/2012 Elsevier Interactive Patient Education  2017 [employer] Commonly Asked Questions   During Pregnancy  Cats: A parasite can be excreted in cat feces.  To avoid exposure you need to have another person empty the little box.  If you must empty the litter box you will need to wear gloves.  Wash your hands after handling your cat.  This parasite can also be found in raw or undercooked meat so this should also be avoided.  Colds,  Sore Throats, Flu: Please check your medication sheet to see what you can take for symptoms.  If your symptoms are unrelieved by these medications please call the office.  Dental Work: Most any dental work your dentist recommends is permitted.  X-rays should only be taken during the first trimester if absolutely necessary.  Your abdomen should be shielded with a lead apron during all x-rays.  Please notify your provider prior to receiving any x-rays.  Novocaine is fine; gas is not recommended.  If your dentist requires a note from us prior to dental work please call the office and we will provide one for you.  Exercise: Exercise is an important part of staying healthy during your pregnancy.  You may continue most exercises you were accustomed to prior to pregnancy.  Later in your pregnancy you will most likely notice you have difficulty with activities requiring balance like riding a bicycle.  It is important that you listen to your body and avoid activities that put you at a higher risk of falling.  Adequate rest and staying well hydrated are a must!  If you have questions about the safety of specific activities ask your provider.    Exposure to Children with illness: Try to avoid obvious exposure; report any symptoms to us when noted,  If you have chicken pos, red measles or mumps, you should be immune to these diseases.   Please do not take any vaccines while pregnant unless you have checked with your OB provider.  Fetal Movement: After 28 weeks we recommend you do "kick counts" twice daily.  Lie or sit down in a calm quiet environment and count your baby movements "kicks".  You should feel your baby at least 10 times per hour.  If you have not felt 10 kicks within the first hour get up, walk around and have something sweet to eat or drink then repeat for an additional hour.  If count remains less than 10 per hour notify your provider.  Fumigating: Follow your pest control agent's advice as to how long  to stay out of your home.  Ventilate the area well before re-entering.  Hemorrhoids:   Most over-the-counter preparations can be used during pregnancy.  Check your medication to see what is safe to use.  It is important to use a stool softener or fiber in your diet and to drink lots of liquids.  If hemorrhoids seem to be getting worse please call the office.   Hot Tubs:  Hot tubs Jacuzzis and saunas are not recommended while pregnant.  These increase your internal body temperature and should be avoided.  Intercourse:  Sexual intercourse is safe during pregnancy as long as you are comfortable, unless otherwise advised by your provider.  Spotting may occur after intercourse; report any bright red bleeding that is heavier than spotting.  Labor:  If you know that you are in labor, please go to the hospital.  If you are unsure, please call the office and let us help you decide what to do.  Lifting, straining, etc:  If your job requires heavy lifting or   straining please check with your provider for any limitations.  Generally, you should not lift items heavier than that you can lift simply with your hands and arms (no back muscles)  Painting:  Paint fumes do not harm your pregnancy, but may make you ill and should be avoided if possible.  Latex or water based paints have less odor than oils.  Use adequate ventilation while painting.  Permanents & Hair Color:  Chemicals in hair dyes are not recommended as they cause increase hair dryness which can increase hair loss during pregnancy.  " Highlighting" and permanents are allowed.  Dye may be absorbed differently and permanents may not hold as well during pregnancy.  Sunbathing:  Use a sunscreen, as skin burns easily during pregnancy.  Drink plenty of fluids; avoid over heating.  Tanning Beds:  Because their possible side effects are still unknown, tanning beds are not recommended.  Ultrasound Scans:  Routine ultrasounds are performed at approximately 20  weeks.  You will be able to see your baby's general anatomy an if you would like to know the gender this can usually be determined as well.  If it is questionable when you conceived you may also receive an ultrasound early in your pregnancy for dating purposes.  Otherwise ultrasound exams are not routinely performed unless there is a medical necessity.  Although you can request a scan we ask that you pay for it when conducted because insurance does not cover " patient request" scans.  Work: If your pregnancy proceeds without complications you may work until your due date, unless your physician or employer advises otherwise.  Round Ligament Pain/Pelvic Discomfort:  Sharp, shooting pains not associated with bleeding are fairly common, usually occurring in the second trimester of pregnancy.  They tend to be worse when standing up or when you remain standing for long periods of time.  These are the result of pressure of certain pelvic ligaments called "round ligaments".  Rest, Tylenol and heat seem to be the most effective relief.  As the womb and fetus grow, they rise out of the pelvis and the discomfort improves.  Please notify the office if your pain seems different than that described.  It may represent a more serious condition.   

## 2016-04-17 LAB — ANTIBODY SCREEN: Antibody Screen: NEGATIVE

## 2016-04-17 LAB — CBC WITH DIFFERENTIAL/PLATELET
Basophils Absolute: 0 10*3/uL (ref 0.0–0.2)
Basos: 0 %
EOS (ABSOLUTE): 0 10*3/uL (ref 0.0–0.4)
EOS: 0 %
HEMATOCRIT: 40.4 % (ref 34.0–46.6)
HEMOGLOBIN: 13.3 g/dL (ref 11.1–15.9)
Immature Grans (Abs): 0 10*3/uL (ref 0.0–0.1)
Immature Granulocytes: 0 %
LYMPHS ABS: 2.8 10*3/uL (ref 0.7–3.1)
Lymphs: 30 %
MCH: 28.9 pg (ref 26.6–33.0)
MCHC: 32.9 g/dL (ref 31.5–35.7)
MCV: 88 fL (ref 79–97)
MONOCYTES: 4 %
Monocytes Absolute: 0.4 10*3/uL (ref 0.1–0.9)
NEUTROS ABS: 6.1 10*3/uL (ref 1.4–7.0)
Neutrophils: 66 %
Platelets: 260 10*3/uL (ref 150–379)
RBC: 4.61 x10E6/uL (ref 3.77–5.28)
RDW: 13.7 % (ref 12.3–15.4)
WBC: 9.3 10*3/uL (ref 3.4–10.8)

## 2016-04-17 LAB — RPR: RPR: NONREACTIVE

## 2016-04-17 LAB — HEPATITIS B SURFACE ANTIGEN: Hepatitis B Surface Ag: NEGATIVE

## 2016-04-17 LAB — RH TYPE: Rh Factor: POSITIVE

## 2016-04-17 LAB — HIV ANTIBODY (ROUTINE TESTING W REFLEX): HIV SCREEN 4TH GENERATION: NONREACTIVE

## 2016-04-17 LAB — VARICELLA ZOSTER ANTIBODY, IGG: VARICELLA: 606 {index} (ref >165)

## 2016-04-17 LAB — HEMOGLOBIN A1C
Est. average glucose Bld gHb Est-mCnc: 88 mg/dL
Hgb A1c MFr Bld: 4.7 % — ABNORMAL LOW (ref 4.8–5.6)

## 2016-04-17 LAB — RUBELLA SCREEN: Rubella Antibodies, IGG: <0.9 index — ABNORMAL LOW (ref >0.99)

## 2016-04-17 LAB — TSH: TSH: 0.878 u[IU]/mL (ref 0.450–4.500)

## 2016-04-17 LAB — ABO

## 2016-04-18 LAB — URINE CULTURE, OB REFLEX

## 2016-04-18 LAB — CULTURE, OB URINE

## 2016-04-19 DIAGNOSIS — O9989 Other specified diseases and conditions complicating pregnancy, childbirth and the puerperium: Secondary | ICD-10-CM

## 2016-04-19 DIAGNOSIS — Z283 Underimmunization status: Secondary | ICD-10-CM | POA: Insufficient documentation

## 2016-04-19 DIAGNOSIS — Z2839 Other underimmunization status: Secondary | ICD-10-CM | POA: Insufficient documentation

## 2016-04-19 LAB — GC/CHLAMYDIA PROBE AMP
CHLAMYDIA, DNA PROBE: NEGATIVE
NEISSERIA GONORRHOEAE BY PCR: NEGATIVE

## 2016-04-19 NOTE — Progress Notes (Signed)
I have reviewed the record and concur with patient management and plan.  Beryl Balz, MD [facility]  

## 2016-04-23 LAB — MONITOR DRUG PROFILE 14(MW)
Amphetamine Scrn, Ur: NEGATIVE ng/mL
BARBITURATE SCREEN URINE: NEGATIVE ng/mL
BENZODIAZEPINE SCREEN, URINE: NEGATIVE ng/mL
Buprenorphine, Urine: NEGATIVE ng/mL
CREATININE(CRT), U: 324 mg/dL — AB (ref 20.0–300.0)
Cocaine (Metab) Scrn, Ur: NEGATIVE ng/mL
FENTANYL, URINE: NEGATIVE pg/mL
MEPERIDINE SCREEN, URINE: NEGATIVE ng/mL
Methadone Screen, Urine: NEGATIVE ng/mL
OPIATE SCREEN URINE: NEGATIVE ng/mL
OXYCODONE+OXYMORPHONE UR QL SCN: NEGATIVE ng/mL
Ph of Urine: 5.5 (ref 4.5–8.9)
Phencyclidine Qn, Ur: NEGATIVE ng/mL
Propoxyphene Scrn, Ur: NEGATIVE ng/mL
SPECIFIC GRAVITY: 1.025
TRAMADOL SCREEN, URINE: NEGATIVE ng/mL

## 2016-04-23 LAB — URINALYSIS, ROUTINE W REFLEX MICROSCOPIC
BILIRUBIN UA: NEGATIVE
GLUCOSE, UA: NEGATIVE
Leukocytes, UA: NEGATIVE
Nitrite, UA: NEGATIVE
PROTEIN UA: NEGATIVE
RBC UA: NEGATIVE
Specific Gravity, UA: >=1.03 — AB (ref 1.005–1.030)
UUROB: 0.2 mg/dL (ref 0.2–1.0)
pH, UA: 5.5 (ref 5.0–7.5)

## 2016-04-23 LAB — NICOTINE SCREEN, URINE: Cotinine Ql Scrn, Ur: NEGATIVE ng/mL

## 2016-04-23 LAB — CANNABINOID (GC/MS), URINE
Cannabinoid: POSITIVE — AB
Carboxy THC (GC/MS): 81 ng/mL

## 2016-05-11 ENCOUNTER — Telehealth: Payer: Self-pay | Admitting: *Deleted

## 2016-05-11 NOTE — Telephone Encounter (Signed)
Patient called and states she would like to have a Panarma test done at her NEW OB visit with Dr. Valentino Saxonherry if possible. Patient will be 11wks . Patient is requesting a call back . Her number is [phone]

## 2016-05-12 NOTE — Telephone Encounter (Signed)
Sent pt mychart message

## 2016-05-19 ENCOUNTER — Ambulatory Visit (INDEPENDENT_AMBULATORY_CARE_PROVIDER_SITE_OTHER): Payer: Medicaid Other | Admitting: Obstetrics and Gynecology

## 2016-05-19 ENCOUNTER — Other Ambulatory Visit: Payer: Self-pay | Admitting: Obstetrics and Gynecology

## 2016-05-19 VITALS — BP 111/73 | HR 96 | Wt 162.6 lb

## 2016-05-19 DIAGNOSIS — Z8679 Personal history of other diseases of the circulatory system: Secondary | ICD-10-CM

## 2016-05-19 DIAGNOSIS — Z1379 Encounter for other screening for genetic and chromosomal anomalies: Secondary | ICD-10-CM

## 2016-05-19 DIAGNOSIS — Z3482 Encounter for supervision of other normal pregnancy, second trimester: Secondary | ICD-10-CM | POA: Diagnosis not present

## 2016-05-19 DIAGNOSIS — Z87898 Personal history of other specified conditions: Secondary | ICD-10-CM

## 2016-05-19 DIAGNOSIS — Z8709 Personal history of other diseases of the respiratory system: Secondary | ICD-10-CM

## 2016-05-19 DIAGNOSIS — E669 Obesity, unspecified: Secondary | ICD-10-CM

## 2016-05-19 DIAGNOSIS — R110 Nausea: Secondary | ICD-10-CM

## 2016-05-19 DIAGNOSIS — O09299 Supervision of pregnancy with other poor reproductive or obstetric history, unspecified trimester: Secondary | ICD-10-CM

## 2016-05-19 LAB — POCT URINALYSIS DIPSTICK
Bilirubin, UA: NEGATIVE
Glucose, UA: NEGATIVE
KETONES UA: NEGATIVE
Nitrite, UA: NEGATIVE
Protein, UA: NEGATIVE
RBC UA: NEGATIVE
SPEC GRAV UA: 1.02 (ref 1.030–1.035)
Urobilinogen, UA: NEGATIVE (ref ?–2.0)
pH, UA: 6.5 (ref 5.0–8.0)

## 2016-05-19 NOTE — Progress Notes (Signed)
OBSTETRIC INITIAL PRENATAL VISIT  Subjective:    [name] is being seen today for her first obstetrical visit.  This is not a planned pregnancy. She is a G13P0020 female at [redacted]w[redacted]d gestation, Estimated Date of Delivery: 11/30/16  determined by 7 week sono, with unsure LMP.   Her obstetrical history is significant for obesity. Also notes h/o pre-diabetes and HTN  (3-4 years ago but resolved).  Relationship with FOB: significant other, living together. Patient does intend to breast feed. Pregnancy history fully reviewed.    Obstetric History   G3   P0   T0   P0   A2   L0    SAB2   TAB0   Ectopic0   Multiple0   Live Births0     # Outcome Date GA Lbr Len/2nd Weight Sex Delivery Anes PTL Lv  3 Current           2 SAB 2015        ND  1 SAB 2012        ND      Gynecologic History:  No prior pap history.   Denies history of STIs.  Contraception: None   Past Medical History:  Diagnosis Date  . Abdominal pain   . Asthma   . History of hypertension   . Nausea   . Peptic ulcer disease   . Pre-diabetes (history)      Family History  Problem Relation Age of Onset  . Cholelithiasis Mother   . SIDS Brother   . Cholelithiasis Maternal Grandmother   . Ulcers Maternal Grandmother   . Diabetes Maternal Grandfather   . Celiac disease Neg Hx      Past Surgical History:  Procedure Laterality Date  . TONSILLECTOMY       Social History   Social History  . Marital status: Single    Spouse name: N/A  . Number of children: N/A  . Years of education: N/A   Occupational History  . Not on file.   Social History Main Topics  . Smoking status: Former Smoker    Packs/day: 1.00    Types: Cigarettes    Quit date: 03/24/2016  . Smokeless tobacco: Never Used  . Alcohol use No  . Drug use: No  . Sexual activity: Yes    Partners: Male    Birth control/ protection: None   Other Topics Concern  . Not on file   Social History Narrative   11th grade 2014-2015     Current  Outpatient Prescriptions on File Prior to Visit  Medication Sig Dispense Refill  . Doxylamine-Pyridoxine 10-10 MG TBEC Day 1 & 2: Take 2 tablets at bedtime on an empty stomach. Day 3: If symptoms persist take 1 tablet in the morning and 2 tables at bedtime. Day 4: If symptoms persist take 1 tablet in the morning, 1 tablet mid-afternoon and 2 tablets at bedtime. Max Dose: 4 tablets a day. Pregnancy Category A 120 tablet 1  . prenatal vitamin w/FE, FA (PRENATAL 1 + 1) 27-1 MG TABS tablet Take 1 tablet by mouth daily at 12 noon.     No current facility-administered medications on file prior to visit.      Allergies  Allergen Reactions  . Metformin And Related   . Sprintec 28 [Norgestimate-Eth Estradiol]       Review of Systems General:Not Present- Fever, Weight Loss and Weight Gain. Skin:Not Present- Rash. HEENT:Not Present- Blurred Vision, Headache and Bleeding Gums. Respiratory:Not Present- Difficulty Breathing. Breast:Not  Present- Breast Mass. Cardiovascular:Not Present- Chest Pain, Elevated Blood Pressure, Fainting / Blacking Out and Shortness of Breath. Gastrointestinal:Present-nausea (controlled with Diclegis). Not Present- Abdominal Pain, Constipation, Vomiting. Female Genitourinary:Not Present- Frequency, Painful Urination, Pelvic Pain, Vaginal Bleeding, Vaginal Discharge, Contractions, regular, Fetal Movements Decreased, Urinary Complaints and Vaginal Fluid. Musculoskeletal:Not Present- Back Pain and Leg Cramps. Neurological:Not Present- Dizziness. Psychiatric:Not Present- Depression.     Objective:   Blood pressure 111/73, pulse 96, weight 162 lb 9.6 oz (73.8 kg), last menstrual period 01/22/2016.  Body mass index is 32.84 kg/m.  General Appearance:    Alert, cooperative, no distress, appears stated age  Head:    Normocephalic, without obvious abnormality, atraumatic  Eyes:    PERRL, conjunctiva/corneas clear, EOM's intact, both eyes  Ears:    Normal  external ear canals, both ears  Nose:   Nares normal, septum midline, mucosa normal, no drainage or sinus tenderness  Throat:   Lips, mucosa, and tongue normal; teeth and gums normal  Neck:   Supple, symmetrical, trachea midline, no adenopathy; thyroid: no enlargement/tenderness/nodules; no carotid bruit or JVD  Back:     Symmetric, no curvature, ROM normal, no CVA tenderness  Lungs:     Clear to auscultation bilaterally, respirations unlabored  Chest Wall:    No tenderness or deformity   Heart:    Regular rate and rhythm, S1 and S2 normal, no murmur, rub or gallop  Breast Exam:    No tenderness, masses, or nipple abnormality  Abdomen:     Soft, non-tender, bowel sounds active all four quadrants, no masses, no organomegaly.  FHT 156 bpm.  Genitalia:    Pelvic:external genitalia normal, vagina without lesions, discharge, or tenderness, rectovaginal septum  normal. Cervix normal in appearance, no cervical motion tenderness, no adnexal masses or tenderness.  Pregnancy positive findings: uterine enlargement: 12 wk size, nontender.   Rectal:    Normal external sphincter.  No hemorrhoids appreciated. Internal exam not done.   Extremities:   Extremities normal, atraumatic, no cyanosis or edema  Pulses:   2+ and symmetric all extremities  Skin:   Skin color, texture, turgor normal, no rashes or lesions  Lymph nodes:   Cervical, supraclavicular, and axillary nodes normal  Neurologic:   CNII-XII intact, normal strength, sensation and reflexes throughout     Assessment:   Pregnancy at 12 and 1/7 weeks  Obesity (BMI 32) H/o pre-diabetes (reports resolution several years ago H/o HTN (reports resolution several years ago, on no meds).  H/o asthma (no h/o of attacks since 2012). On no meds Nausea of pregnancy H/o miscarriages  Plan:  1. Pregnancy at 12 and 1/7 weeks  - Initial labs reviewed. - Prenatal vitamins encouraged. - Problem list reviewed and updated. - New OB counseling:  The patient has  been given an overview regarding routine prenatal care.  - Prenatal testing, optional genetic testing, and ultrasound use in pregnancy were reviewed. MaterniT21 ordered.  - Week Benefits of Breast Feeding were discussed. The patient is encouraged to consider nursing her baby post partum.  2. Obesity (BMI 32) -  Recommendations regarding diet, weight gain, and exercise in pregnancy were given. - Patient will need early glucola performed.   3. H/o pre-diabetes (reports resolution several years ago - HgbA1c normal.  Will need early glucola during pregnancy  4. H/o HTN (reports resolution several years ago, on no meds).  - Will get baseline PIH labs  5. H/o asthma (no h/o of attacks since 2012). On no meds - Will continue to  monitor throughout the pregnancy and treat as necessary.   6. Nausea of pregnancy  - Controlled with Diclegis.   7. H/o miscarriages - Patient now close to entering 2nd trimester, less risk of miscarriage. Viability confirmed today.   Follow up in 4 weeks.  50% of 30 min visit spent on counseling and coordination of care.     Hildred Laser, MD [facility]

## 2016-05-19 NOTE — Patient Instructions (Addendum)

## 2016-05-20 LAB — COMPREHENSIVE METABOLIC PANEL
ALBUMIN: 4 g/dL (ref 3.5–5.5)
ALT: 14 IU/L (ref 0–32)
AST: 12 IU/L (ref 0–40)
Albumin/Globulin Ratio: 1.5 (ref 1.2–2.2)
Alkaline Phosphatase: 51 IU/L (ref 39–117)
BUN / CREAT RATIO: 11 (ref 9–23)
BUN: 7 mg/dL (ref 6–20)
Bilirubin Total: <0.2 mg/dL (ref 0.0–1.2)
CALCIUM: 9.2 mg/dL (ref 8.7–10.2)
CO2: 24 mmol/L (ref 18–29)
CREATININE: 0.62 mg/dL (ref 0.57–1.00)
Chloride: 100 mmol/L (ref 96–106)
GFR calc Af Amer: 150 mL/min/{1.73_m2} (ref >59)
GFR calc non Af Amer: 130 mL/min/{1.73_m2} (ref >59)
GLOBULIN, TOTAL: 2.7 g/dL (ref 1.5–4.5)
GLUCOSE: 74 mg/dL (ref 65–99)
Potassium: 4.3 mmol/L (ref 3.5–5.2)
SODIUM: 138 mmol/L (ref 134–144)
TOTAL PROTEIN: 6.7 g/dL (ref 6.0–8.5)

## 2016-05-30 LAB — COMPREHENSIVE METABOLIC PANEL

## 2016-05-30 LAB — MATERNIT21  PLUS CORE+ESS+SCA, BLOOD
CHROMOSOME 13: NEGATIVE
CHROMOSOME 21: NEGATIVE
Chromosome 18: NEGATIVE
Y Chromosome: DETECTED

## 2016-05-31 ENCOUNTER — Telehealth: Payer: Self-pay | Admitting: Obstetrics and Gynecology

## 2016-05-31 NOTE — Telephone Encounter (Signed)
Patient wanted to get results of labs that were done for genetic testing. Please advise.

## 2016-06-01 NOTE — Telephone Encounter (Signed)
-----   Message from Hildred Laser, MD sent at 06/01/2016  1:42 PM EDT ----- Negative MaterniT21, is a female if desires to know gender.

## 2016-06-01 NOTE — Telephone Encounter (Signed)
Called pt informed her of results below.  

## 2016-06-16 ENCOUNTER — Ambulatory Visit (INDEPENDENT_AMBULATORY_CARE_PROVIDER_SITE_OTHER): Payer: Medicaid Other | Admitting: Obstetrics and Gynecology

## 2016-06-16 ENCOUNTER — Encounter: Payer: Self-pay | Admitting: Obstetrics and Gynecology

## 2016-06-16 ENCOUNTER — Other Ambulatory Visit: Payer: Medicaid Other

## 2016-06-16 ENCOUNTER — Other Ambulatory Visit: Payer: Self-pay | Admitting: Obstetrics and Gynecology

## 2016-06-16 VITALS — BP 94/63 | HR 98 | Wt 156.4 lb

## 2016-06-16 DIAGNOSIS — Z87898 Personal history of other specified conditions: Secondary | ICD-10-CM

## 2016-06-16 DIAGNOSIS — Z3492 Encounter for supervision of normal pregnancy, unspecified, second trimester: Secondary | ICD-10-CM

## 2016-06-16 LAB — POCT URINALYSIS DIPSTICK
Bilirubin, UA: NEGATIVE
Blood, UA: NEGATIVE
Glucose, UA: NEGATIVE
Ketones, UA: NEGATIVE
LEUKOCYTES UA: NEGATIVE
Nitrite, UA: NEGATIVE
PROTEIN UA: NEGATIVE
Spec Grav, UA: 1.01 (ref 1.010–1.025)
Urobilinogen, UA: NEGATIVE E.U./dL — AB
pH, UA: 7.5 (ref 5.0–8.0)

## 2016-06-16 NOTE — Progress Notes (Signed)
ROB:  AFP and early 1 hr GCT today.  5 lbs weight loss - pt "not hungry"  - no ketones.  We discussed diet.  FAS schedule for 19 wks.

## 2016-06-17 LAB — GLUCOSE, 1 HOUR GESTATIONAL: Gestational Diabetes Screen: 83 mg/dL (ref 65–139)

## 2016-06-18 ENCOUNTER — Encounter: Payer: Self-pay | Admitting: Obstetrics and Gynecology

## 2016-06-19 LAB — AFP, SERUM, OPEN SPINA BIFIDA
AFP MoM: 2.36
AFP Value: 75.3 ng/mL
GEST. AGE ON COLLECTION DATE: 16 wk
Maternal Age At EDD: 20.8 yr
OSBR Risk 1 IN: 378
Test Results:: NEGATIVE
WEIGHT: 156 [lb_av]

## 2016-07-09 ENCOUNTER — Ambulatory Visit: Payer: Medicaid Other

## 2016-07-09 ENCOUNTER — Ambulatory Visit (INDEPENDENT_AMBULATORY_CARE_PROVIDER_SITE_OTHER): Payer: Medicaid Other | Admitting: Obstetrics and Gynecology

## 2016-07-09 VITALS — BP 118/77 | HR 96 | Wt 154.5 lb

## 2016-07-09 DIAGNOSIS — Z3492 Encounter for supervision of normal pregnancy, unspecified, second trimester: Secondary | ICD-10-CM

## 2016-07-09 DIAGNOSIS — O2612 Low weight gain in pregnancy, second trimester: Secondary | ICD-10-CM

## 2016-07-09 DIAGNOSIS — K0889 Other specified disorders of teeth and supporting structures: Secondary | ICD-10-CM

## 2016-07-09 LAB — POCT URINALYSIS DIPSTICK
Bilirubin, UA: NEGATIVE
Glucose, UA: NEGATIVE
Ketones, UA: NEGATIVE
NITRITE UA: NEGATIVE
PH UA: 7 (ref 5.0–8.0)
Protein, UA: NEGATIVE
RBC UA: NEGATIVE
SPEC GRAV UA: 1.025 (ref 1.010–1.025)
Urobilinogen, UA: 0.2 E.U./dL

## 2016-07-11 NOTE — Progress Notes (Signed)
ROB: Notes complaints of left tooth pain, states that her left lower wisdom tooth has been growing in crooked.  Patient notes that she has not had dental care in "some time".  Will refer to Dentist.  Advised that she can use Orajel and Tylenol prn. S/p normal anatomy scan. Declines serum AFP today. RTC in 4 weeks.

## 2016-08-06 ENCOUNTER — Encounter: Payer: Self-pay | Admitting: Obstetrics and Gynecology

## 2016-08-06 ENCOUNTER — Encounter: Payer: Medicaid Other | Admitting: Obstetrics and Gynecology

## 2016-08-09 ENCOUNTER — Encounter: Payer: Self-pay | Admitting: Obstetrics and Gynecology

## 2016-08-09 ENCOUNTER — Ambulatory Visit (INDEPENDENT_AMBULATORY_CARE_PROVIDER_SITE_OTHER): Payer: Medicaid Other | Admitting: Obstetrics and Gynecology

## 2016-08-09 VITALS — BP 90/56 | HR 85 | Wt 161.1 lb

## 2016-08-09 DIAGNOSIS — Z3492 Encounter for supervision of normal pregnancy, unspecified, second trimester: Secondary | ICD-10-CM

## 2016-08-09 LAB — POCT URINALYSIS DIPSTICK
Bilirubin, UA: NEGATIVE
Blood, UA: NEGATIVE
Glucose, UA: NEGATIVE
KETONES UA: NEGATIVE
Leukocytes, UA: NEGATIVE
Nitrite, UA: NEGATIVE
PH UA: 7.5 (ref 5.0–8.0)
PROTEIN UA: NEGATIVE
Urobilinogen, UA: 0.2 E.U./dL

## 2016-08-09 NOTE — Progress Notes (Signed)
ROB:  Rd lig pain.  "tired".  Active baby. Needs repeat 1hr GCT with next visit.

## 2016-09-06 ENCOUNTER — Encounter: Payer: Medicaid Other | Admitting: Obstetrics and Gynecology

## 2016-09-06 ENCOUNTER — Other Ambulatory Visit: Payer: Medicaid Other

## 2016-09-06 ENCOUNTER — Ambulatory Visit (INDEPENDENT_AMBULATORY_CARE_PROVIDER_SITE_OTHER): Payer: Medicaid Other | Admitting: Obstetrics and Gynecology

## 2016-09-06 VITALS — BP 111/69 | HR 91 | Wt 163.1 lb

## 2016-09-06 DIAGNOSIS — Z23 Encounter for immunization: Secondary | ICD-10-CM

## 2016-09-06 DIAGNOSIS — Z131 Encounter for screening for diabetes mellitus: Secondary | ICD-10-CM

## 2016-09-06 DIAGNOSIS — Z130 Encounter for screening for diseases of the blood and blood-forming organs and certain disorders involving the immune mechanism: Secondary | ICD-10-CM

## 2016-09-06 DIAGNOSIS — Z3483 Encounter for supervision of other normal pregnancy, third trimester: Secondary | ICD-10-CM

## 2016-09-06 LAB — POCT URINALYSIS DIPSTICK
BILIRUBIN UA: NEGATIVE
GLUCOSE UA: NEGATIVE
KETONES UA: NEGATIVE
Nitrite, UA: NEGATIVE
Protein, UA: NEGATIVE
RBC UA: NEGATIVE
SPEC GRAV UA: 1.02 (ref 1.010–1.025)
Urobilinogen, UA: 0.2 E.U./dL
pH, UA: 6.5 (ref 5.0–8.0)

## 2016-09-06 MED ORDER — TETANUS-DIPHTH-ACELL PERTUSSIS 5-2.5-18.5 LF-MCG/0.5 IM SUSP
0.5000 mL | Freq: Once | INTRAMUSCULAR | Status: AC
  Administered 2016-09-06: 0.5 mL via INTRAMUSCULAR

## 2016-09-06 NOTE — Progress Notes (Signed)
ROB: Doing well. Still notes left wisdom tooth pain, tried Orajel with no relief. Has not yet followed up with dentist. Encouraged patient to do so.  For 28 week labs today.  Desires to breast and bottle feed,  Desires NuvaRing for contraception. For Tdap today, signed blood consent, discussed cord blood banking.

## 2016-09-07 LAB — CBC WITH DIFFERENTIAL/PLATELET
BASOS: 0 %
Basophils Absolute: 0 10*3/uL (ref 0.0–0.2)
EOS (ABSOLUTE): 0.1 10*3/uL (ref 0.0–0.4)
Eos: 1 %
HEMATOCRIT: 36 % (ref 34.0–46.6)
Hemoglobin: 11.9 g/dL (ref 11.1–15.9)
IMMATURE GRANULOCYTES: 0 %
Immature Grans (Abs): 0 10*3/uL (ref 0.0–0.1)
LYMPHS ABS: 2.5 10*3/uL (ref 0.7–3.1)
Lymphs: 28 %
MCH: 29.5 pg (ref 26.6–33.0)
MCHC: 33.1 g/dL (ref 31.5–35.7)
MCV: 89 fL (ref 79–97)
MONOS ABS: 0.4 10*3/uL (ref 0.1–0.9)
Monocytes: 5 %
NEUTROS ABS: 6 10*3/uL (ref 1.4–7.0)
NEUTROS PCT: 66 %
PLATELETS: 250 10*3/uL (ref 150–379)
RBC: 4.04 x10E6/uL (ref 3.77–5.28)
RDW: 13.3 % (ref 12.3–15.4)
WBC: 8.9 10*3/uL (ref 3.4–10.8)

## 2016-09-07 LAB — GLUCOSE, 1 HOUR GESTATIONAL: Gestational Diabetes Screen: 90 mg/dL (ref 65–139)

## 2016-09-20 ENCOUNTER — Ambulatory Visit (INDEPENDENT_AMBULATORY_CARE_PROVIDER_SITE_OTHER): Payer: Medicaid Other | Admitting: Obstetrics and Gynecology

## 2016-09-20 VITALS — BP 114/75 | HR 76 | Wt 164.6 lb

## 2016-09-20 DIAGNOSIS — Z3483 Encounter for supervision of other normal pregnancy, third trimester: Secondary | ICD-10-CM

## 2016-09-20 NOTE — Progress Notes (Signed)
ROB:  No complaints today. Reports active fetal movement. Size slightly greater than dates. - Follow-consider ultrasound later in third trimester.  "[name]"

## 2016-09-29 ENCOUNTER — Encounter: Payer: Self-pay | Admitting: Obstetrics and Gynecology

## 2016-09-30 ENCOUNTER — Encounter: Payer: Self-pay | Admitting: Obstetrics and Gynecology

## 2016-09-30 ENCOUNTER — Ambulatory Visit (INDEPENDENT_AMBULATORY_CARE_PROVIDER_SITE_OTHER): Payer: Medicaid Other | Admitting: Obstetrics and Gynecology

## 2016-09-30 VITALS — BP 129/67 | HR 79 | Wt 165.8 lb

## 2016-09-30 DIAGNOSIS — R110 Nausea: Secondary | ICD-10-CM

## 2016-09-30 DIAGNOSIS — R109 Unspecified abdominal pain: Secondary | ICD-10-CM

## 2016-09-30 DIAGNOSIS — O26899 Other specified pregnancy related conditions, unspecified trimester: Secondary | ICD-10-CM

## 2016-09-30 LAB — POCT URINALYSIS DIPSTICK
Bilirubin, UA: NEGATIVE
Blood, UA: NEGATIVE
Glucose, UA: NEGATIVE
KETONES UA: NEGATIVE
NITRITE UA: NEGATIVE
PH UA: 7.5 (ref 5.0–8.0)
PROTEIN UA: NEGATIVE
Spec Grav, UA: 1.01 (ref 1.010–1.025)
UROBILINOGEN UA: 0.2 U/dL

## 2016-09-30 MED ORDER — ONDANSETRON HCL 4 MG PO TABS: 4.0000 mg | ORAL_TABLET | Freq: Three times a day (TID) | ORAL | 0 refills | Status: DC | PRN

## 2016-09-30 NOTE — Progress Notes (Signed)
Problem OB: Abdominal pain in mid left side of abdomen ongoing x several days.  Notes continued nausea without vomiting, desires refill of Zofran. Desires cervical exam as she is afraid she may be dilating (notes a friend of hers had the same kind of pain and had preterm labor).  Cervix closed on exam, minimal discharge. Offered reassurance. Encouraged to continue taking Tylenol ES prn, warm baths, staying hydrated. F/u next week for routine OB appt.

## 2016-10-05 ENCOUNTER — Ambulatory Visit (INDEPENDENT_AMBULATORY_CARE_PROVIDER_SITE_OTHER): Payer: Medicaid Other | Admitting: Obstetrics and Gynecology

## 2016-10-05 VITALS — BP 104/70 | HR 110 | Wt 166.3 lb

## 2016-10-05 DIAGNOSIS — Z3490 Encounter for supervision of normal pregnancy, unspecified, unspecified trimester: Secondary | ICD-10-CM | POA: Insufficient documentation

## 2016-10-05 DIAGNOSIS — Z3483 Encounter for supervision of other normal pregnancy, third trimester: Secondary | ICD-10-CM

## 2016-10-05 DIAGNOSIS — E669 Obesity, unspecified: Secondary | ICD-10-CM

## 2016-10-05 LAB — POCT URINALYSIS DIPSTICK
Bilirubin, UA: NEGATIVE
Blood, UA: NEGATIVE
Glucose, UA: NEGATIVE
KETONES UA: NEGATIVE
Nitrite, UA: NEGATIVE
PH UA: 6 (ref 5.0–8.0)
Protein, UA: NEGATIVE
SPEC GRAV UA: 1.025 (ref 1.010–1.025)
Urobilinogen, UA: 0.2 E.U./dL

## 2016-10-05 NOTE — Addendum Note (Signed)
Addended by: Frankey ShownGLANTON, Keavon Sensing C on: 10/05/2016 09:42 AM   Modules accepted: Orders

## 2016-10-05 NOTE — Progress Notes (Signed)
ROB: C/o pressure and stretching, and sore ribs from fetal movement but otherwise ok.  RTC in 2 weeks.

## 2016-10-13 ENCOUNTER — Ambulatory Visit (INDEPENDENT_AMBULATORY_CARE_PROVIDER_SITE_OTHER): Payer: Medicaid Other | Admitting: Obstetrics and Gynecology

## 2016-10-13 ENCOUNTER — Encounter: Payer: Self-pay | Admitting: Obstetrics and Gynecology

## 2016-10-13 VITALS — BP 111/75 | HR 88 | Wt 168.1 lb

## 2016-10-13 DIAGNOSIS — Z3483 Encounter for supervision of other normal pregnancy, third trimester: Secondary | ICD-10-CM

## 2016-10-13 NOTE — Progress Notes (Signed)
ROB:  Patient here for emergency visit. She thinks she is leaking fluid.  Reports small amounts leaking this morning not now. Says baby not moving as much.  Says the baby was bothering her ribs yesterday so she did a series of squats and now has pelvic discomfort. NEG pooling.  Ntz-NEG.  Fern NEG.     U/A  NEG    Cervix appears closed by sterile spec. No evidence of ROM.  We discussed rupture of membranes in detail. Discussed decreased fetal movement-patient was not counting rolling stretching movements - kicks have decreased. Advised to discontinue doing squats. If her condition changes she has been advised to call for an appointment or go to labor and delivery. Otherwise she will follow up in 6 days with her currently scheduled appointment.

## 2016-10-19 ENCOUNTER — Encounter: Payer: Self-pay | Admitting: Obstetrics and Gynecology

## 2016-10-19 ENCOUNTER — Ambulatory Visit (INDEPENDENT_AMBULATORY_CARE_PROVIDER_SITE_OTHER): Payer: Medicaid Other | Admitting: Obstetrics and Gynecology

## 2016-10-19 VITALS — BP 105/69 | HR 80 | Wt 170.0 lb

## 2016-10-19 DIAGNOSIS — Z3483 Encounter for supervision of other normal pregnancy, third trimester: Secondary | ICD-10-CM

## 2016-10-19 LAB — POCT URINALYSIS DIPSTICK
BILIRUBIN UA: NEGATIVE
GLUCOSE UA: NEGATIVE
Ketones, UA: NEGATIVE
LEUKOCYTES UA: NEGATIVE
NITRITE UA: NEGATIVE
PH UA: 5 (ref 5.0–8.0)
Protein, UA: NEGATIVE
RBC UA: NEGATIVE
Spec Grav, UA: 1.015 (ref 1.010–1.025)
UROBILINOGEN UA: 0.2 U/dL

## 2016-10-19 NOTE — Progress Notes (Signed)
ROB:  Patient reports no further leaking, reports normal fetal movement, has no complaints today.  She says she generally just feels "tired".    Needs GC/CT and GBS next visit.

## 2016-11-02 ENCOUNTER — Ambulatory Visit (INDEPENDENT_AMBULATORY_CARE_PROVIDER_SITE_OTHER): Payer: Medicaid Other | Admitting: Obstetrics and Gynecology

## 2016-11-02 VITALS — BP 108/76 | HR 120 | Wt 174.2 lb

## 2016-11-02 DIAGNOSIS — Z3685 Encounter for antenatal screening for Streptococcus B: Secondary | ICD-10-CM

## 2016-11-02 DIAGNOSIS — Z3483 Encounter for supervision of other normal pregnancy, third trimester: Secondary | ICD-10-CM

## 2016-11-02 DIAGNOSIS — Z113 Encounter for screening for infections with a predominantly sexual mode of transmission: Secondary | ICD-10-CM

## 2016-11-02 LAB — POCT URINALYSIS DIPSTICK
Bilirubin, UA: NEGATIVE
Glucose, UA: NEGATIVE
KETONES UA: NEGATIVE
Leukocytes, UA: NEGATIVE
Nitrite, UA: NEGATIVE
PH UA: 6 (ref 5.0–8.0)
PROTEIN UA: NEGATIVE
RBC UA: NEGATIVE
SPEC GRAV UA: 1.015 (ref 1.010–1.025)
UROBILINOGEN UA: 0.2 U/dL

## 2016-11-02 LAB — OB RESULTS CONSOLE GBS: STREP GROUP B AG: NEGATIVE

## 2016-11-02 NOTE — Progress Notes (Signed)
ROB: Patient with no major complaints.  For 36 week labs today. Discussed pain management in labor, desires to try IV meds only if needed.  Does desire circumcision for female fetus, discussed that it usually performed by Pediatricians outpatient [facility] and [facility] associated.  Patient notes understanding.  RTC in 1 week.  Discussed labor precautions.

## 2016-11-03 LAB — GC/CHLAMYDIA PROBE AMP
CHLAMYDIA, DNA PROBE: NEGATIVE
Neisseria gonorrhoeae by PCR: NEGATIVE

## 2016-11-06 LAB — CULTURE, BETA STREP (GROUP B ONLY): Strep Gp B Culture: NEGATIVE

## 2016-11-09 ENCOUNTER — Encounter: Payer: Self-pay | Admitting: Obstetrics and Gynecology

## 2016-11-09 ENCOUNTER — Ambulatory Visit (INDEPENDENT_AMBULATORY_CARE_PROVIDER_SITE_OTHER): Payer: Medicaid Other | Admitting: Obstetrics and Gynecology

## 2016-11-09 VITALS — BP 97/64 | HR 81 | Wt 175.2 lb

## 2016-11-09 DIAGNOSIS — O368130 Decreased fetal movements, third trimester, not applicable or unspecified: Secondary | ICD-10-CM

## 2016-11-09 DIAGNOSIS — Z3483 Encounter for supervision of other normal pregnancy, third trimester: Secondary | ICD-10-CM | POA: Diagnosis not present

## 2016-11-09 DIAGNOSIS — E669 Obesity, unspecified: Secondary | ICD-10-CM

## 2016-11-09 LAB — POCT URINALYSIS DIPSTICK
BILIRUBIN UA: NEGATIVE
GLUCOSE UA: NEGATIVE
KETONES UA: NEGATIVE
Leukocytes, UA: NEGATIVE
NITRITE UA: NEGATIVE
Protein, UA: NEGATIVE
RBC UA: NEGATIVE
Spec Grav, UA: 1.01 (ref 1.010–1.025)
Urobilinogen, UA: 0.2 E.U./dL
pH, UA: 6 (ref 5.0–8.0)

## 2016-11-09 NOTE — Addendum Note (Signed)
Addended by: Brooke DareSICK, Lazarus [place]bury L on: 11/09/2016 12:11 PM   Modules accepted: Orders

## 2016-11-09 NOTE — Progress Notes (Signed)
NONSTRESS TEST INTERPRETATION  INDICATIONS: decreased fetal movement  RESULTS:  reactive COMMENTS: 2 contractions in 20 minutes (appears more like irritability/mild)  PLAN: 1. Follow-up one week  Elonda Huskyavid J. Stanly Si, M.D. 11/09/2016 11:23 AM

## 2016-11-09 NOTE — Progress Notes (Signed)
ROB: Complains of decreased fetal movement. Denies leakage of fluid or bleeding. Has had irregular contractions since Sunday the most was 4 per day. Complains of back pain says she thinks its "back labor".  Sent for NST and toco monitoring.

## 2016-11-15 ENCOUNTER — Observation Stay
Admission: EM | Admit: 2016-11-15 | Discharge: 2016-11-16 | Disposition: A | Discharge status: Home / Self Care | Payer: Medicaid Other | Attending: Obstetrics and Gynecology | Admitting: Obstetrics and Gynecology

## 2016-11-15 DIAGNOSIS — O471 False labor at or after 37 completed weeks of gestation: Secondary | ICD-10-CM | POA: Diagnosis present

## 2016-11-15 DIAGNOSIS — Z3A38 38 weeks gestation of pregnancy: Secondary | ICD-10-CM | POA: Diagnosis not present

## 2016-11-15 DIAGNOSIS — O479 False labor, unspecified: Secondary | ICD-10-CM

## 2016-11-15 DIAGNOSIS — R109 Unspecified abdominal pain: Secondary | ICD-10-CM | POA: Diagnosis present

## 2016-11-15 MED ORDER — PROMETHAZINE HCL 25 MG PO TABS
25.0000 mg | ORAL_TABLET | Freq: Once | ORAL | Status: AC
  Administered 2016-11-15: 25 mg via ORAL

## 2016-11-15 MED ORDER — ACETAMINOPHEN 325 MG PO TABS: 650.0000 mg | ORAL_TABLET | ORAL | Status: DC | PRN

## 2016-11-15 MED ORDER — MORPHINE SULFATE (PF) 10 MG/ML IV SOLN
10.0000 mg | Freq: Once | INTRAVENOUS | Status: AC
  Administered 2016-11-15: 10 mg via INTRAMUSCULAR

## 2016-11-15 MED ORDER — CALCIUM CARBONATE ANTACID 500 MG PO CHEW: 2.0000 | CHEWABLE_TABLET | ORAL | Status: DC | PRN

## 2016-11-15 MED ORDER — PROMETHAZINE HCL 25 MG PO TABS
ORAL_TABLET | ORAL | Status: AC
  Filled 2016-11-15: qty 1

## 2016-11-15 MED ORDER — MORPHINE SULFATE (PF) 10 MG/ML IV SOLN
INTRAVENOUS | Status: AC
  Administered 2016-11-15: 10 mg via INTRAMUSCULAR
  Filled 2016-11-15: qty 1

## 2016-11-15 NOTE — Progress Notes (Signed)
Verbal order by provider to watch patient overnight and recheck cervix in the morning. Order to give IM morphine for pain. Will continue to monitor patient.

## 2016-11-15 NOTE — OB Triage Note (Signed)
patien c/o of contractions since this Saturday, however they were irregular. Since this morning patient reports that contractions are now regular every 3-5 mins apart. Since 1800 today patient has felt constant pressure in her vagina. States that contractions are felt in her lower back and upper abdomen. Rating pain 8 out of 10. Denies vaginal bleeding and denies LOF. Reports decrease in fetal movement. Denies sexual intercourse in the last 24 hours.

## 2016-11-16 ENCOUNTER — Ambulatory Visit (INDEPENDENT_AMBULATORY_CARE_PROVIDER_SITE_OTHER): Payer: Medicaid Other | Admitting: Obstetrics and Gynecology

## 2016-11-16 VITALS — BP 123/80 | HR 111 | Wt 175.5 lb

## 2016-11-16 DIAGNOSIS — O479 False labor, unspecified: Secondary | ICD-10-CM

## 2016-11-16 DIAGNOSIS — Z3A38 38 weeks gestation of pregnancy: Secondary | ICD-10-CM

## 2016-11-16 DIAGNOSIS — O471 False labor at or after 37 completed weeks of gestation: Secondary | ICD-10-CM

## 2016-11-16 DIAGNOSIS — Z3483 Encounter for supervision of other normal pregnancy, third trimester: Secondary | ICD-10-CM

## 2016-11-16 DIAGNOSIS — R109 Unspecified abdominal pain: Secondary | ICD-10-CM | POA: Diagnosis present

## 2016-11-16 NOTE — Final Progress Note (Signed)
L&D OB Triage Note  SUBJECTIVE [name] is a 21 y.o. G18P0020 female at [redacted]w[redacted]d, EDD Estimated Date of Delivery: 11/30/16 who presented to triage with complaints of contractions.    OBJECTIVE Nursing Evaluation: BP 121/72   Pulse 81   Temp 98.3 F (36.8 C) (Oral)   Resp 16   LMP 01/22/2016 (Exact Date) Comment: Spotted 02/17/16 X 2 days no significant findings of labor. Cervix is unchanged-closed per RN exam  NST was performed and has been reviewed by me.  NST INTERPRETATION: Indications: Contractions  Mode: External Baseline Rate (A): 130 bpm Variability: Moderate Accelerations: 15 x 15 Decelerations: None     Contraction Frequency (min): none  ASSESSMENT Impression:  1. Pregnancy:  G3P0020 at [redacted]w[redacted]d , EDD Estimated Date of Delivery: 11/30/16 2.  reactive NST  PLAN 1. Reassurance given 2. Discharge home with bleeding/labor precautions.  3. Continue routine prenatal care.  Herold Harms, MD    Herold Harms, MD

## 2016-11-16 NOTE — Discharge Instructions (Signed)
Keep your next OB appointment. Drink plenty of fluid and get plenty of rest. Call your provider with any other concerns,.

## 2016-11-16 NOTE — Progress Notes (Signed)
ROB: Patient reports that she feels well. No complaints.

## 2016-11-16 NOTE — Progress Notes (Signed)
ROB: Seen in L&D overnight due to contractions, was ruled out for labor. Reiterated labor precautions.  RTC in 1 week.

## 2016-11-23 ENCOUNTER — Encounter: Payer: Self-pay | Admitting: Obstetrics and Gynecology

## 2016-11-23 ENCOUNTER — Ambulatory Visit (INDEPENDENT_AMBULATORY_CARE_PROVIDER_SITE_OTHER): Payer: Medicaid Other | Admitting: Obstetrics and Gynecology

## 2016-11-23 VITALS — BP 123/78 | HR 108 | Wt 179.2 lb

## 2016-11-23 DIAGNOSIS — Z3483 Encounter for supervision of other normal pregnancy, third trimester: Secondary | ICD-10-CM

## 2016-11-23 LAB — POCT URINALYSIS DIPSTICK
Bilirubin, UA: NEGATIVE
GLUCOSE UA: NEGATIVE
Ketones, UA: NEGATIVE
Leukocytes, UA: NEGATIVE
NITRITE UA: NEGATIVE
PROTEIN UA: NEGATIVE
RBC UA: NEGATIVE
Spec Grav, UA: 1.025 (ref 1.010–1.025)
Urobilinogen, UA: 0.2 E.U./dL
pH, UA: 5 (ref 5.0–8.0)

## 2016-11-23 NOTE — Progress Notes (Signed)
ROB:  S&S - 1 week f/u

## 2016-11-25 ENCOUNTER — Observation Stay
Admission: EM | Admit: 2016-11-25 | Discharge: 2016-11-25 | Disposition: A | Discharge status: Home / Self Care | Payer: Medicaid Other | Attending: Obstetrics and Gynecology | Admitting: Obstetrics and Gynecology

## 2016-11-25 ENCOUNTER — Encounter: Payer: Self-pay | Admitting: *Deleted

## 2016-11-25 DIAGNOSIS — O368130 Decreased fetal movements, third trimester, not applicable or unspecified: Secondary | ICD-10-CM | POA: Diagnosis not present

## 2016-11-25 DIAGNOSIS — Z3A39 39 weeks gestation of pregnancy: Secondary | ICD-10-CM | POA: Diagnosis not present

## 2016-11-25 DIAGNOSIS — Z3490 Encounter for supervision of normal pregnancy, unspecified, unspecified trimester: Secondary | ICD-10-CM

## 2016-11-25 NOTE — OB Triage Note (Signed)
Recvd pt from ED. Pt states she has not felt baby move all day. Pt has no other complaints.

## 2016-11-25 NOTE — Discharge Instructions (Signed)
Come back if: ° °Big gush of fluids °Heavy vaginal bleeding °Decreased fetal movement °Temp over 100.4 °Contractions every 3-5 min lasting at least one hour ° °Stay well hydrated and get plenty of rest! °

## 2016-11-26 NOTE — Discharge Summary (Signed)
    L&D OB Triage Note  SUBJECTIVE [name] is a 21 y.o. G7P0020 female at [redacted]w[redacted]d, EDD Estimated Date of Delivery: 11/30/16 who presented to triage with complaints of decreased fetal movement today.   Obstetric History   G3   P0   T0   P0   A2   L0    SAB2   TAB0   Ectopic0   Multiple0   Live Births0     # Outcome Date GA Lbr Len/2nd Weight Sex Delivery Anes PTL Lv  3 Current           2 SAB 2015        ND  1 SAB 2012        ND      No prescriptions prior to admission.     OBJECTIVE  Nursing Evaluation:   BP 120/87 (BP Location: Left Arm)   Pulse 100   Temp 98.5 F (36.9 C) (Oral)   Resp 16   LMP 01/22/2016 (Exact Date) Comment: Spotted 02/17/16 X 2 days   Findings:  Active movement  NST was performed and has been reviewed by me.  NST INTERPRETATION: Category I  Mode: External Baseline Rate (A): 148 bpm (fht) Variability: Moderate Accelerations: 15 x 15 Decelerations: Variable (1, MD aware)     Contraction Frequency (min): rare  ASSESSMENT Impression:  1. Pregnancy:  G3P0020 at [redacted]w[redacted]d , EDD Estimated Date of Delivery: 11/30/16 2.  Reactive 3.  Baby moving well.  PLAN 1. Reassurance given 2. Discharge home with precautions to return to L&D  3. Continue routine prenatal care.

## 2016-11-28 ENCOUNTER — Inpatient Hospital Stay: Payer: Medicaid Other | Admitting: Anesthesiology

## 2016-11-28 ENCOUNTER — Inpatient Hospital Stay
Admission: EM | Admit: 2016-11-28 | Discharge: 2016-12-01 | DRG: 787 | Disposition: A | Discharge status: Home / Self Care | Payer: Medicaid Other | Attending: Obstetrics and Gynecology | Admitting: Obstetrics and Gynecology

## 2016-11-28 DIAGNOSIS — F1290 Cannabis use, unspecified, uncomplicated: Secondary | ICD-10-CM | POA: Diagnosis present

## 2016-11-28 DIAGNOSIS — O9952 Diseases of the respiratory system complicating childbirth: Secondary | ICD-10-CM | POA: Diagnosis present

## 2016-11-28 DIAGNOSIS — O4703 False labor before 37 completed weeks of gestation, third trimester: Secondary | ICD-10-CM

## 2016-11-28 DIAGNOSIS — Z87891 Personal history of nicotine dependence: Secondary | ICD-10-CM

## 2016-11-28 DIAGNOSIS — O99324 Drug use complicating childbirth: Secondary | ICD-10-CM | POA: Diagnosis present

## 2016-11-28 DIAGNOSIS — Z3490 Encounter for supervision of normal pregnancy, unspecified, unspecified trimester: Secondary | ICD-10-CM

## 2016-11-28 DIAGNOSIS — O4292 Full-term premature rupture of membranes, unspecified as to length of time between rupture and onset of labor: Secondary | ICD-10-CM | POA: Diagnosis not present

## 2016-11-28 DIAGNOSIS — Z3A39 39 weeks gestation of pregnancy: Secondary | ICD-10-CM | POA: Diagnosis not present

## 2016-11-28 DIAGNOSIS — Z98891 History of uterine scar from previous surgery: Secondary | ICD-10-CM

## 2016-11-28 DIAGNOSIS — O26893 Other specified pregnancy related conditions, third trimester: Secondary | ICD-10-CM | POA: Diagnosis present

## 2016-11-28 LAB — CBC
HEMATOCRIT: 37.1 % (ref 35.0–47.0)
Hemoglobin: 12.7 g/dL (ref 12.0–16.0)
MCH: 28.7 pg (ref 26.0–34.0)
MCHC: 34.4 g/dL (ref 32.0–36.0)
MCV: 83.4 fL (ref 80.0–100.0)
PLATELETS: 243 10*3/uL (ref 150–440)
RBC: 4.44 MIL/uL (ref 3.80–5.20)
RDW: 13.6 % (ref 11.5–14.5)
WBC: 9.6 10*3/uL (ref 3.6–11.0)

## 2016-11-28 LAB — CHLAMYDIA/NGC RT PCR (ARMC ONLY)
Chlamydia Tr: NOT DETECTED
N GONORRHOEAE: NOT DETECTED

## 2016-11-28 LAB — TYPE AND SCREEN
ABO/RH(D): O POS
Antibody Screen: NEGATIVE

## 2016-11-28 LAB — RAPID HIV SCREEN (HIV 1/2 AB+AG)
HIV 1/2 ANTIBODIES: NONREACTIVE
HIV-1 P24 ANTIGEN - HIV24: NONREACTIVE

## 2016-11-28 MED ORDER — OXYTOCIN 10 UNIT/ML IJ SOLN
INTRAMUSCULAR | Status: AC
  Filled 2016-11-28: qty 2

## 2016-11-28 MED ORDER — EPHEDRINE 5 MG/ML INJ: 10.0000 mg | INTRAVENOUS | Status: DC | PRN

## 2016-11-28 MED ORDER — PHENYLEPHRINE 40 MCG/ML (10ML) SYRINGE FOR IV PUSH (FOR BLOOD PRESSURE SUPPORT): 80.0000 ug | PREFILLED_SYRINGE | INTRAVENOUS | Status: DC | PRN

## 2016-11-28 MED ORDER — SOD CITRATE-CITRIC ACID 500-334 MG/5ML PO SOLN
30.0000 mL | ORAL | Status: DC | PRN
  Administered 2016-11-29: 30 mL via ORAL
  Filled 2016-11-28: qty 15

## 2016-11-28 MED ORDER — OXYTOCIN BOLUS FROM INFUSION: 500.0000 mL | Freq: Once | INTRAVENOUS | Status: DC

## 2016-11-28 MED ORDER — SODIUM CHLORIDE 0.9 % IV SOLN: 1.0000 g | INTRAVENOUS | Status: DC

## 2016-11-28 MED ORDER — LACTATED RINGERS IV SOLN: 500.0000 mL | INTRAVENOUS | Status: DC | PRN

## 2016-11-28 MED ORDER — LIDOCAINE-EPINEPHRINE (PF) 1.5 %-1:200000 IJ SOLN
INTRAMUSCULAR | Status: DC | PRN
  Administered 2016-11-28: 3 mL via EPIDURAL

## 2016-11-28 MED ORDER — LACTATED RINGERS IV SOLN
INTRAVENOUS | Status: DC
  Administered 2016-11-28 (×3): via INTRAVENOUS

## 2016-11-28 MED ORDER — FENTANYL CITRATE (PF) 100 MCG/2ML IJ SOLN
50.0000 ug | INTRAMUSCULAR | Status: DC | PRN
  Administered 2016-11-28: 100 ug via INTRAVENOUS
  Administered 2016-11-28: 50 ug via INTRAVENOUS
  Administered 2016-11-29: 100 ug via INTRAVENOUS
  Filled 2016-11-28 (×2): qty 2

## 2016-11-28 MED ORDER — ONDANSETRON HCL 4 MG/2ML IJ SOLN
4.0000 mg | Freq: Four times a day (QID) | INTRAMUSCULAR | Status: DC | PRN
  Administered 2016-11-28: 4 mg via INTRAVENOUS
  Filled 2016-11-28: qty 2

## 2016-11-28 MED ORDER — FENTANYL 2.5 MCG/ML W/ROPIVACAINE 0.15% IN NS 100 ML EPIDURAL (ARMC)
EPIDURAL | Status: AC
  Filled 2016-11-28: qty 100

## 2016-11-28 MED ORDER — OXYTOCIN 40 UNITS IN LACTATED RINGERS INFUSION - SIMPLE MED: 2.5000 [IU]/h | INTRAVENOUS | Status: DC

## 2016-11-28 MED ORDER — LACTATED RINGERS IV SOLN: 500.0000 mL | Freq: Once | INTRAVENOUS | Status: DC

## 2016-11-28 MED ORDER — OXYTOCIN 40 UNITS IN LACTATED RINGERS INFUSION - SIMPLE MED
1.0000 m[IU]/min | INTRAVENOUS | Status: DC
  Administered 2016-11-28: 2 m[IU]/min via INTRAVENOUS
  Administered 2016-11-29: 500 mL via INTRAVENOUS
  Filled 2016-11-28: qty 1000

## 2016-11-28 MED ORDER — AMMONIA AROMATIC IN INHA
RESPIRATORY_TRACT | Status: AC
  Filled 2016-11-28: qty 10

## 2016-11-28 MED ORDER — LIDOCAINE HCL (PF) 1 % IJ SOLN
INTRAMUSCULAR | Status: DC | PRN
  Administered 2016-11-28 (×2): 1 mL via INTRADERMAL

## 2016-11-28 MED ORDER — OXYCODONE-ACETAMINOPHEN 5-325 MG PO TABS: 1.0000 | ORAL_TABLET | ORAL | Status: DC | PRN

## 2016-11-28 MED ORDER — MISOPROSTOL 200 MCG PO TABS
ORAL_TABLET | ORAL | Status: AC
  Filled 2016-11-28: qty 4

## 2016-11-28 MED ORDER — DIPHENHYDRAMINE HCL 50 MG/ML IJ SOLN: 12.5000 mg | INTRAMUSCULAR | Status: DC | PRN

## 2016-11-28 MED ORDER — SODIUM CHLORIDE 0.9 % IV SOLN
INTRAVENOUS | Status: DC | PRN
  Administered 2016-11-28 (×2): 5 mL via EPIDURAL

## 2016-11-28 MED ORDER — ACETAMINOPHEN 325 MG PO TABS: 650.0000 mg | ORAL_TABLET | ORAL | Status: DC | PRN

## 2016-11-28 MED ORDER — OXYCODONE-ACETAMINOPHEN 5-325 MG PO TABS: 2.0000 | ORAL_TABLET | ORAL | Status: DC | PRN

## 2016-11-28 MED ORDER — TERBUTALINE SULFATE 1 MG/ML IJ SOLN: 0.2500 mg | Freq: Once | INTRAMUSCULAR | Status: DC | PRN

## 2016-11-28 MED ORDER — FENTANYL 2.5 MCG/ML W/ROPIVACAINE 0.15% IN NS 100 ML EPIDURAL (ARMC)
12.0000 mL/h | EPIDURAL | Status: DC
  Administered 2016-11-28: 12 mL/h via EPIDURAL

## 2016-11-28 MED ORDER — LIDOCAINE HCL (PF) 1 % IJ SOLN
30.0000 mL | INTRAMUSCULAR | Status: DC | PRN
  Filled 2016-11-28: qty 30

## 2016-11-28 NOTE — Anesthesia Procedure Notes (Signed)
Epidural Patient location during procedure: OB Start time: 11/28/2016 10:02 PM End time: 11/28/2016 10:07 PM  Staffing Anesthesiologist: Margorie John K Performed: anesthesiologist   Preanesthetic Checklist Completed: patient identified, site marked, surgical consent, pre-op evaluation, timeout performed, IV checked, risks and benefits discussed and monitors and equipment checked  Epidural Patient position: sitting Prep: Betadine Patient monitoring: heart rate, continuous pulse ox and blood pressure Approach: midline Location: L3-L4 Injection technique: LOR saline  Needle:  Needle type: Tuohy  Needle gauge: 17 G Needle length: 9 cm and 9 Needle insertion depth: 6 cm Catheter type: closed end flexible Catheter size: 19 Gauge Catheter at skin depth: 11 cm Test dose: negative and 1.5% lidocaine with Epi 1:200 K  Assessment Sensory level: T10 Events: blood not aspirated, injection not painful, no injection resistance, negative IV test and no paresthesia  Additional Notes 2 attempts Pt. Evaluated and documentation done after procedure finished. Patient identified. Risks/Benefits/Options discussed with patient including but not limited to bleeding, infection, nerve damage, paralysis, failed block, incomplete pain control, headache, blood pressure changes, nausea, vomiting, reactions to medication both or allergic, itching and postpartum back pain. Confirmed with bedside nurse the patient's most recent platelet count. Confirmed with patient that they are not currently taking any anticoagulation, have any bleeding history or any family history of bleeding disorders. Patient expressed understanding and wished to proceed. All questions were answered. Sterile technique was used throughout the entire procedure. Please see nursing notes for vital signs. Test dose was given through epidural catheter and negative prior to continuing to dose epidural or start infusion. Warning signs of high  block given to the patient including shortness of breath, tingling/numbness in hands, complete motor block, or any concerning symptoms with instructions to call for help. Patient was given instructions on fall risk and not to get out of bed. All questions and concerns addressed with instructions to call with any issues or inadequate analgesia.   Patient tolerated the insertion well without immediate complications.Reason for block:procedure for pain

## 2016-11-28 NOTE — Progress Notes (Signed)
LABOR CHECK:  SUBJECTIVE: Patient hurting a little bit more now since the start of Pitocin; currently on 6 mU/m; currently on 6 mU/m.  OBJECTIVE: BP 135/75   Pulse 96   Temp 98.3 F (36.8 C) (Oral)   Ht  (1.499 m)   Wt 170 lb (77.1 kg)   LMP 01/22/2016 (Exact Date) Comment: Spotted 02/17/16 X 2 days  BMI 34.34 kg/m  Cervix: 3-4/90/-1/vertex Fetal heart rate: Category 1  ASSESSMENT: 1. No progress  PLAN: 1. IUPC placed 2. Goal-200 [place] units  Herold Harms, MD

## 2016-11-28 NOTE — H&P (Signed)
Obstetric History and Physical  Allyanna Boulden is a 21 y.o. [id number] with IUP at [redacted]w[redacted]d presenting for labor. Patient states she has been having  regular contractions, none vaginal bleeding, intact, ruptured membranes with nitrazine-positive status, with active fetal movement.    Prenatal Course Source of Care: [facility]   Pregnancy complications or risks: Patient Active Problem List   Diagnosis Date Noted  . Premature uterine contractions in third trimester, antepartum 11/28/2016  . Pregnancy 11/25/2016  . Uterine contractions during pregnancy 11/15/2016  . Supervision of normal pregnancy 10/05/2016  . Rubella non-immune status, antepartum 04/19/2016  . Acquired acanthosis nigricans 11/11/2010  . Depressive disorder 07/19/2010  . Obesity (BMI 30.0-34.9) 07/19/2010  . Extrinsic asthma 04/03/2009   She plans to breastfeed She desires condoms for postpartum contraception.   Prenatal labs and studies: ABO, Rh: O/Positive/-- (02/16 1533) Antibody: Negative (02/16 1533) Rubella: <0.90 (02/16 1533) RPR: Non Reactive (02/16 1533)  HBsAg: Negative (02/16 1533)  HIV:   negative 04/16/2016 [id number] (09/04 1519) 1 hr Glucola  83 Genetic screening normal maternity 21; [facility] only) screen negative for open spina bifida Anatomy US normal 07/09/2016 TSH is 0.878 11/30/2016 Parasellar in the  Past Medical History:  Diagnosis Date  . Abdominal pain   . Asthma   . History of hypertension   . Hypertension   . Nausea   . Peptic ulcer disease   . Pre-diabetes (history)     Past Surgical History:  Procedure Laterality Date  . TONSILLECTOMY      OB History  Gravida Para Term Preterm AB Living  3       2    SAB TAB Ectopic Multiple Live Births  2            # Outcome Date GA Lbr Len/2nd Weight Sex Delivery Anes PTL Lv  3 Current           2 SAB 2015        ND  1 SAB 2012        ND      Social History   Social History  . Marital status: Single    Spouse name: N/A  .  Number of children: N/A  . Years of education: N/A   Social History Main Topics  . Smoking status: Former Smoker    Packs/day: 1.00    Types: Cigarettes    Quit date: 03/24/2016  . Smokeless tobacco: Never Used  . Alcohol use No  . Drug use: No  . Sexual activity: Not Currently    Partners: Male    Birth control/ protection: None   Other Topics Concern  . Not on file   Social History Narrative   11th grade 2014-2015    Family History  Problem Relation Age of Onset  . Cholelithiasis Mother   . SIDS Brother   . Cholelithiasis Maternal Grandmother   . Ulcers Maternal Grandmother   . Diabetes Maternal Grandfather   . Celiac disease Neg Hx     Prescriptions Prior to Admission  Medication Sig Dispense Refill Last Dose  . ondansetron (ZOFRAN) 4 MG tablet Take 1 tablet (4 mg total) by mouth every 8 (eight) hours as needed for nausea or vomiting. 30 tablet 0 Past Week at Unknown time  . prenatal vitamin w/FE, FA (PRENATAL 1 + 1) 27-1 MG TABS tablet Take 1 tablet by mouth daily at 12 noon.   11/25/2016 at Unknown time    Allergies  Allergen Reactions  . Metformin And Related   .  Sprintec 28 [Norgestimate-Eth Estradiol]     Review of Systems: Negative except for what is mentioned in HPI.  Physical Exam: BP 118/75   Pulse (!) 105   LMP 01/22/2016 (Exact Date) Comment: Spotted 02/17/16 X 2 days CONSTITUTIONAL: Well-developed, well-nourished female in no acute distress.  HENT:  Normocephalic, atraumatic, External right and left ear normal. Oropharynx is clear and moist EYES: Conjunctivae and EOM are normal. Pupils are equal, round, and reactive to light. No scleral icterus.  NECK: Normal range of motion, supple, no masses SKIN: Skin is warm and dry. No rash noted. Not diaphoretic. No erythema. No pallor. NEUROLGIC: Alert and oriented to person, place, and time. Normal reflexes, muscle tone coordination. No cranial nerve deficit noted. PSYCHIATRIC: Normal mood and affect.  Normal behavior. Normal judgment and thought content. CARDIOVASCULAR: Normal heart rate noted, regular rhythm RESPIRATORY: Effort and breath sounds normal, no problems with respiration noted ABDOMEN: Soft, nontender, nondistended, gravid. MUSCULOSKELETAL: Normal range of motion. No edema and no tenderness. 2+ distal pulses.  Cervical Exam: Dilatation 2-3 cm   Effacement 90 %   Station -1   Presentation: cephalic FHT: Category 1 Pertinent Labs/Studies:   No results found for this or any previous visit (from the past 24 hour(s)).  Assessment : Rakeb Ceja is a 21 y.o. G3P0020 at [redacted]w[redacted]d being admitted for labor. Rubella nonimmune GBS negative History of THC use on UDS  Plan: 1. Admit for labor 2. Epidural MRN 3. Anticipate NSVD  Prentice Docker. Beatris Si, MD, FACOG [facility]

## 2016-11-28 NOTE — Anesthesia Preprocedure Evaluation (Signed)
Anesthesia Evaluation  Patient identified by MRN, date of birth, ID band Patient awake    Reviewed: Allergy & Precautions, H&P , NPO status , Patient's Chart, lab work & pertinent test results  Airway Mallampati: III  TM Distance: >3 FB Neck ROM: full    Dental  (+) Poor Dentition   Pulmonary asthma , former smoker,           Cardiovascular Exercise Tolerance: Good hypertension,      Neuro/Psych PSYCHIATRIC DISORDERS Depression    GI/Hepatic negative GI ROS, PUD,   Endo/Other    Renal/GU   negative genitourinary   Musculoskeletal   Abdominal   Peds  Hematology negative hematology ROS (+)   Anesthesia Other Findings Past Medical History: No date: Abdominal pain No date: Asthma No date: History of hypertension No date: Hypertension No date: Nausea No date: Peptic ulcer disease No date: Pre-diabetes (history)  Past Surgical History: No date: TONSILLECTOMY  BMI    Body Mass Index:  34.34 kg/m      Reproductive/Obstetrics (+) Pregnancy                             Anesthesia Physical Anesthesia Plan  ASA: III  Anesthesia Plan: Epidural   Post-op Pain Management:    Induction:   PONV Risk Score and Plan:   Airway Management Planned:   Additional Equipment:   Intra-op Plan:   Post-operative Plan:   Informed Consent: I have reviewed the patients History and Physical, chart, labs and discussed the procedure including the risks, benefits and alternatives for the proposed anesthesia with the patient or authorized representative who has indicated his/her understanding and acceptance.     Plan Discussed with: Anesthesiologist  Anesthesia Plan Comments:         Anesthesia Quick Evaluation

## 2016-11-28 NOTE — Progress Notes (Signed)
Kasondra Omahoney is a 21 y.o. G3P0020 at [redacted]w[redacted]d by ultrasound admitted for active labor, PROM  Subjective: Pt comfortable   Objective: BP 118/75   Pulse (!) 105   Temp 98 F (36.7 C) (Oral)   Ht  (1.499 m)   Wt 170 lb (77.1 kg)   LMP 01/22/2016 (Exact Date) Comment: Spotted 02/17/16 X 2 days  BMI 34.34 kg/m  No intake/output data recorded. No intake/output data recorded.  FHT:  Cat 1 UC:   regular, every 3 minutes SVE:   Dilation: 3 Effacement (%): 70, 80 Station: -1 Exam by:: Zameria Vogl MD  Labs: Lab Results  Component Value Date   WBC 9.6 11/28/2016   HGB 12.7 11/28/2016   HCT 37.1 11/28/2016   MCV 83.4 11/28/2016   PLT 243 11/28/2016    Assessment / Plan: Slow progress after PROM  Labor: Stalled Preeclampsia: NA Fetal Wellbeing:  Category I Pain Control:  Labor support without medications Anticipated MOD:  NSVD   Start Pitocin  Axel Meas A Calleen Alvis 11/28/2016, 2:19 PM

## 2016-11-28 NOTE — Progress Notes (Signed)
Pt sitting up for epidural during this half hour. Monitor tracing maternal vs fetal heart tones while pt sitting up. RN at bedside

## 2016-11-29 ENCOUNTER — Encounter
Admission: EM | Disposition: A | Discharge status: Home / Self Care | Payer: Self-pay | Source: Home / Self Care | Attending: Obstetrics and Gynecology

## 2016-11-29 ENCOUNTER — Encounter: Payer: Self-pay | Admitting: Anesthesiology

## 2016-11-29 DIAGNOSIS — O4292 Full-term premature rupture of membranes, unspecified as to length of time between rupture and onset of labor: Secondary | ICD-10-CM

## 2016-11-29 DIAGNOSIS — Z3A39 39 weeks gestation of pregnancy: Secondary | ICD-10-CM

## 2016-11-29 DIAGNOSIS — Z98891 History of uterine scar from previous surgery: Secondary | ICD-10-CM

## 2016-11-29 LAB — CBC
HCT: 32.8 % — ABNORMAL LOW (ref 35.0–47.0)
HEMOGLOBIN: 11 g/dL — AB (ref 12.0–16.0)
MCH: 27.9 pg (ref 26.0–34.0)
MCHC: 33.5 g/dL (ref 32.0–36.0)
MCV: 83.2 fL (ref 80.0–100.0)
Platelets: 223 10*3/uL (ref 150–440)
RBC: 3.94 MIL/uL (ref 3.80–5.20)
RDW: 13.7 % (ref 11.5–14.5)
WBC: 18.5 10*3/uL — ABNORMAL HIGH (ref 3.6–11.0)

## 2016-11-29 LAB — URINE DRUG SCREEN, QUALITATIVE (ARMC ONLY)
Amphetamines, Ur Screen: NOT DETECTED
BARBITURATES, UR SCREEN: NOT DETECTED
Benzodiazepine, Ur Scrn: NOT DETECTED
COCAINE METABOLITE, UR ~~LOC~~: NOT DETECTED
Cannabinoid 50 Ng, Ur ~~LOC~~: NOT DETECTED
MDMA (ECSTASY) UR SCREEN: NOT DETECTED
METHADONE SCREEN, URINE: NOT DETECTED
OPIATE, UR SCREEN: POSITIVE — AB
Phencyclidine (PCP) Ur S: NOT DETECTED
Tricyclic, Ur Screen: NOT DETECTED

## 2016-11-29 SURGERY — Surgical Case
Anesthesia: Epidural | Site: Abdomen | Wound class: Clean Contaminated

## 2016-11-29 MED ORDER — NALBUPHINE HCL 10 MG/ML IJ SOLN: 5.0000 mg | INTRAMUSCULAR | Status: DC | PRN

## 2016-11-29 MED ORDER — ONDANSETRON HCL 4 MG/2ML IJ SOLN: 4.0000 mg | Freq: Three times a day (TID) | INTRAMUSCULAR | Status: DC | PRN

## 2016-11-29 MED ORDER — ACETAMINOPHEN 500 MG PO TABS
1000.0000 mg | ORAL_TABLET | Freq: Four times a day (QID) | ORAL | Status: AC
  Administered 2016-11-29 (×3): 1000 mg via ORAL
  Filled 2016-11-29 (×3): qty 2

## 2016-11-29 MED ORDER — OXYCODONE HCL 5 MG PO TABS
5.0000 mg | ORAL_TABLET | ORAL | Status: AC | PRN
Start: 2016-11-29 — End: 2016-11-30
  Administered 2016-11-29: 5 mg via ORAL
  Filled 2016-11-29 (×3): qty 1

## 2016-11-29 MED ORDER — NALOXONE HCL 0.4 MG/ML IJ SOLN: 0.4000 mg | INTRAMUSCULAR | Status: DC | PRN

## 2016-11-29 MED ORDER — KETOROLAC TROMETHAMINE 30 MG/ML IJ SOLN
30.0000 mg | Freq: Four times a day (QID) | INTRAMUSCULAR | Status: AC | PRN
  Administered 2016-11-29 (×2): 30 mg via INTRAVENOUS
  Filled 2016-11-29: qty 1

## 2016-11-29 MED ORDER — MEASLES, MUMPS & RUBELLA VAC ~~LOC~~ INJ
0.5000 mL | INJECTION | Freq: Once | SUBCUTANEOUS | Status: AC
  Administered 2016-12-01: 0.5 mL via SUBCUTANEOUS
  Filled 2016-11-29 (×2): qty 0.5

## 2016-11-29 MED ORDER — FERROUS SULFATE 325 (65 FE) MG PO TABS
325.0000 mg | ORAL_TABLET | Freq: Two times a day (BID) | ORAL | Status: DC
  Administered 2016-11-29 – 2016-12-01 (×5): 325 mg via ORAL
  Filled 2016-11-29 (×5): qty 1

## 2016-11-29 MED ORDER — DIPHENHYDRAMINE HCL 50 MG/ML IJ SOLN
12.5000 mg | INTRAMUSCULAR | Status: DC | PRN
  Administered 2016-11-29: 12.5 mg via INTRAVENOUS
  Filled 2016-11-29: qty 1

## 2016-11-29 MED ORDER — LIDOCAINE 5 % EX PTCH
1.0000 | MEDICATED_PATCH | CUTANEOUS | Status: DC
  Administered 2016-11-30: 1 via TRANSDERMAL
  Filled 2016-11-29 (×2): qty 1

## 2016-11-29 MED ORDER — DIPHENHYDRAMINE HCL 25 MG PO CAPS
25.0000 mg | ORAL_CAPSULE | Freq: Four times a day (QID) | ORAL | Status: DC | PRN
  Administered 2016-12-01: 25 mg via ORAL
  Filled 2016-11-29: qty 1

## 2016-11-29 MED ORDER — NALBUPHINE HCL 10 MG/ML IJ SOLN: 5.0000 mg | Freq: Once | INTRAMUSCULAR | Status: DC | PRN

## 2016-11-29 MED ORDER — WITCH HAZEL-GLYCERIN EX PADS: 1.0000 "application " | MEDICATED_PAD | CUTANEOUS | Status: DC | PRN

## 2016-11-29 MED ORDER — SODIUM CHLORIDE 0.9% FLUSH
3.0000 mL | INTRAVENOUS | Status: DC | PRN
  Administered 2016-11-29: 3 mL via INTRAVENOUS
  Filled 2016-11-29: qty 3

## 2016-11-29 MED ORDER — DIPHENHYDRAMINE HCL 25 MG PO CAPS: 25.0000 mg | ORAL_CAPSULE | ORAL | Status: DC | PRN

## 2016-11-29 MED ORDER — LACTATED RINGERS IV SOLN
INTRAVENOUS | Status: DC
  Administered 2016-11-29: 11:00:00 via INTRAVENOUS

## 2016-11-29 MED ORDER — OXYCODONE HCL 5 MG/5ML PO SOLN: 5.0000 mg | Freq: Once | ORAL | Status: DC | PRN

## 2016-11-29 MED ORDER — LIDOCAINE 5 % EX PTCH
MEDICATED_PATCH | CUTANEOUS | Status: DC | PRN
  Administered 2016-11-29: 1 via TRANSDERMAL

## 2016-11-29 MED ORDER — KETOROLAC TROMETHAMINE 30 MG/ML IJ SOLN: 30.0000 mg | Freq: Four times a day (QID) | INTRAMUSCULAR | Status: AC | PRN

## 2016-11-29 MED ORDER — FENTANYL CITRATE (PF) 100 MCG/2ML IJ SOLN
INTRAMUSCULAR | Status: AC
  Filled 2016-11-29: qty 2

## 2016-11-29 MED ORDER — MENTHOL 3 MG MT LOZG
1.0000 | LOZENGE | OROMUCOSAL | Status: DC | PRN
  Filled 2016-11-29: qty 9

## 2016-11-29 MED ORDER — SENNOSIDES-DOCUSATE SODIUM 8.6-50 MG PO TABS
2.0000 | ORAL_TABLET | ORAL | Status: DC
  Administered 2016-11-30: 2 via ORAL
  Filled 2016-11-29: qty 2

## 2016-11-29 MED ORDER — CEFAZOLIN SODIUM-DEXTROSE 2-4 GM/100ML-% IV SOLN
2.0000 g | Freq: Once | INTRAVENOUS | Status: AC
  Administered 2016-11-29: 2 g via INTRAVENOUS
  Filled 2016-11-29 (×2): qty 100

## 2016-11-29 MED ORDER — PHENYLEPHRINE HCL 10 MG/ML IJ SOLN
INTRAMUSCULAR | Status: DC | PRN
  Administered 2016-11-29 (×6): 100 ug via INTRAVENOUS

## 2016-11-29 MED ORDER — IBUPROFEN 800 MG PO TABS
800.0000 mg | ORAL_TABLET | Freq: Three times a day (TID) | ORAL | Status: DC
  Administered 2016-11-29 – 2016-12-01 (×6): 800 mg via ORAL
  Filled 2016-11-29 (×7): qty 1

## 2016-11-29 MED ORDER — LIDOCAINE HCL (PF) 2 % IJ SOLN
INTRAMUSCULAR | Status: AC
  Filled 2016-11-29: qty 20

## 2016-11-29 MED ORDER — ACETAMINOPHEN 325 MG PO TABS: 650.0000 mg | ORAL_TABLET | ORAL | Status: DC | PRN

## 2016-11-29 MED ORDER — TETANUS-DIPHTH-ACELL PERTUSSIS 5-2.5-18.5 LF-MCG/0.5 IM SUSP: 0.5000 mL | Freq: Once | INTRAMUSCULAR | Status: DC

## 2016-11-29 MED ORDER — SIMETHICONE 80 MG PO CHEW
80.0000 mg | CHEWABLE_TABLET | ORAL | Status: DC
  Administered 2016-11-30: 80 mg via ORAL
  Filled 2016-11-29: qty 1

## 2016-11-29 MED ORDER — LIDOCAINE 2% (20 MG/ML) 5 ML SYRINGE
INTRAMUSCULAR | Status: DC | PRN
  Administered 2016-11-29 (×4): 100 mg via INTRAVENOUS

## 2016-11-29 MED ORDER — MEPERIDINE HCL 25 MG/ML IJ SOLN: 6.2500 mg | INTRAMUSCULAR | Status: DC | PRN

## 2016-11-29 MED ORDER — CEFAZOLIN SODIUM 1 G IJ SOLR
INTRAMUSCULAR | Status: AC
  Filled 2016-11-29: qty 20

## 2016-11-29 MED ORDER — OXYCODONE HCL 5 MG PO TABS: 5.0000 mg | ORAL_TABLET | Freq: Once | ORAL | Status: DC | PRN

## 2016-11-29 MED ORDER — ONDANSETRON HCL 4 MG/2ML IJ SOLN
INTRAMUSCULAR | Status: DC | PRN
  Administered 2016-11-29: 4 mg via INTRAVENOUS

## 2016-11-29 MED ORDER — DIBUCAINE 1 % RE OINT: 1.0000 "application " | TOPICAL_OINTMENT | RECTAL | Status: DC | PRN

## 2016-11-29 MED ORDER — KETOROLAC TROMETHAMINE 30 MG/ML IJ SOLN
INTRAMUSCULAR | Status: AC
  Administered 2016-11-29: 30 mg via INTRAVENOUS
  Filled 2016-11-29: qty 1

## 2016-11-29 MED ORDER — MORPHINE SULFATE (PF) 0.5 MG/ML IJ SOLN
INTRAMUSCULAR | Status: AC
  Filled 2016-11-29: qty 10

## 2016-11-29 MED ORDER — OXYCODONE-ACETAMINOPHEN 5-325 MG PO TABS
1.0000 | ORAL_TABLET | ORAL | Status: DC | PRN
Start: 2016-11-29 — End: 2016-12-01
  Administered 2016-11-30 – 2016-12-01 (×3): 1 via ORAL
  Filled 2016-11-29 (×3): qty 1

## 2016-11-29 MED ORDER — SIMETHICONE 80 MG PO CHEW: 80.0000 mg | CHEWABLE_TABLET | ORAL | Status: DC | PRN

## 2016-11-29 MED ORDER — PRENATAL MULTIVITAMIN CH
1.0000 | ORAL_TABLET | Freq: Every day | ORAL | Status: DC
  Administered 2016-11-29 – 2016-11-30 (×2): 1 via ORAL
  Filled 2016-11-29 (×2): qty 1

## 2016-11-29 MED ORDER — FENTANYL CITRATE (PF) 100 MCG/2ML IJ SOLN
25.0000 ug | INTRAMUSCULAR | Status: DC | PRN
  Administered 2016-11-29 (×2): 50 ug via INTRAVENOUS
  Filled 2016-11-29: qty 2

## 2016-11-29 MED ORDER — MORPHINE SULFATE (PF) 0.5 MG/ML IJ SOLN
INTRAMUSCULAR | Status: DC | PRN
  Administered 2016-11-29: 4 mg via EPIDURAL

## 2016-11-29 MED ORDER — EPHEDRINE SULFATE 50 MG/ML IJ SOLN
INTRAMUSCULAR | Status: DC | PRN
  Administered 2016-11-29: 10 mg via INTRAVENOUS

## 2016-11-29 MED ORDER — SIMETHICONE 80 MG PO CHEW
80.0000 mg | CHEWABLE_TABLET | Freq: Three times a day (TID) | ORAL | Status: DC
  Administered 2016-11-30: 80 mg via ORAL
  Filled 2016-11-29 (×3): qty 1

## 2016-11-29 MED ORDER — COCONUT OIL OIL: 1.0000 "application " | TOPICAL_OIL | Status: DC | PRN

## 2016-11-29 MED ORDER — OXYCODONE-ACETAMINOPHEN 5-325 MG PO TABS
2.0000 | ORAL_TABLET | ORAL | Status: DC | PRN
Start: 2016-11-29 — End: 2016-12-01
  Administered 2016-11-30 – 2016-12-01 (×5): 2 via ORAL
  Filled 2016-11-29 (×5): qty 2

## 2016-11-29 MED ORDER — OXYTOCIN 40 UNITS IN LACTATED RINGERS INFUSION - SIMPLE MED
2.5000 [IU]/h | INTRAVENOUS | Status: AC
  Filled 2016-11-29: qty 1000

## 2016-11-29 SURGICAL SUPPLY — 28 items
CANISTER SUCT 3000ML PPV (MISCELLANEOUS) ×3 IMPLANT
CHLORAPREP W/TINT 26ML (MISCELLANEOUS) ×9 IMPLANT
CLOSURE WOUND 1/2 X4 (GAUZE/BANDAGES/DRESSINGS) ×1
DERMABOND ADVANCED (GAUZE/BANDAGES/DRESSINGS) ×4
DERMABOND ADVANCED .7 DNX12 (GAUZE/BANDAGES/DRESSINGS) ×2 IMPLANT
DRSG TELFA 3X8 NADH (GAUZE/BANDAGES/DRESSINGS) ×3 IMPLANT
ELECT REM PT RETURN 9FT ADLT (ELECTROSURGICAL) ×3
ELECTRODE REM PT RTRN 9FT ADLT (ELECTROSURGICAL) ×1 IMPLANT
GAUZE SPONGE 4X4 12PLY STRL (GAUZE/BANDAGES/DRESSINGS) ×3 IMPLANT
GLOVE BIO SURGEON STRL SZ8 (GLOVE) ×18 IMPLANT
GOWN STRL REUS W/ TWL LRG LVL3 (GOWN DISPOSABLE) ×2 IMPLANT
GOWN STRL REUS W/ TWL XL LVL3 (GOWN DISPOSABLE) ×1 IMPLANT
GOWN STRL REUS W/TWL LRG LVL3 (GOWN DISPOSABLE) ×4
GOWN STRL REUS W/TWL XL LVL3 (GOWN DISPOSABLE) ×2
NS IRRIG 1000ML POUR BTL (IV SOLUTION) ×3 IMPLANT
PACK C SECTION AR (MISCELLANEOUS) ×3 IMPLANT
PAD OB MATERNITY 4.3X12.25 (PERSONAL CARE ITEMS) ×3 IMPLANT
PAD PREP 24X41 OB/GYN DISP (PERSONAL CARE ITEMS) ×3 IMPLANT
SPONGE LAP 18X18 5 PK (GAUZE/BANDAGES/DRESSINGS) ×3 IMPLANT
STRAP SAFETY BODY (MISCELLANEOUS) ×3 IMPLANT
STRIP CLOSURE SKIN 1/2X4 (GAUZE/BANDAGES/DRESSINGS) ×2 IMPLANT
SUT CHROMIC 1-0 (SUTURE) ×12 IMPLANT
SUT MAXON ABS #0 GS21 30IN (SUTURE) ×6 IMPLANT
SUT PLAIN GUT 0 (SUTURE) IMPLANT
SUT VIC AB 2-0 CT1 27 (SUTURE) ×4
SUT VIC AB 2-0 CT1 TAPERPNT 27 (SUTURE) ×2 IMPLANT
SUT VIC AB 4-0 KS 27 (SUTURE) ×3 IMPLANT
SWABSTK COMLB BENZOIN TINCTURE (MISCELLANEOUS) ×3 IMPLANT

## 2016-11-29 NOTE — Op Note (Signed)
OPERATIVE NOTE:  Armie Slee PROCEDURE DATE: 11/29/2016   PREOPERATIVE DIAGNOSIS:  1. 38.5 week intrauterine pregnancy, undelivered 2. Arrest of dilation 3. Nonreassuring fetal heart rate tracing  POSTOPERATIVE DIAGNOSIS:  1. 38.5 week intrauterine pregnancy, delivered 2. Arrest of dilation 3. Nonreassuring fetal heart rate tracing 4. Viable female infant 7 lbs. 14 oz.  PROCEDURE: Primary low cervical transverse cesarean section  SURGEON:  Herold Harms, MD ASSISTANTS: Farrel Conners, CNM ANESTHESIA: Epidural INDICATIONS: 21 y.o. [id number] who presented at 39.[redacted] weeks gestation in labor. Pitocin augmentation was performed. Patient progressed to 8 cm/90%/-1-0 station, and made no further progress. Last part of labor was notable for a nonreassuring fetal heart rate tracing with moderate to severe variables being identified. Due to the arrest of dilation, patient was counseled to undergo cesarean section delivery.  FINDINGS:  Viable female 7 lbs. 14 oz. with Apgars of 8 and 9 at one and 5 minutes respectively; uterus tubes and ovaries were grossly normal.   I/O's: Total I/O In: -  Out: 95 [Urine:95] COUNTS:  YES SPECIMENS: None ANTIBIOTIC PROPHYLAXIS:Ancef 2 grams COMPLICATIONS: None immediate  PROCEDURE IN DETAIL: Patient was brought to the operating room where the epidural had been dosed. She was placed in the supine position with a right lateral hip roll in place. After checking for adequate level of anesthesia, the abdomen and perineum were prepped with ChloraPrep and draped in standard fashion. Foley catheter was draining clear yellow urine from the bladder. Timeout was completed. Following testing with an Allis clamp for adequate anesthesia, the procedure was started with a Pfannenstiel incision being made into the abdomen. This was extended down to the fascia which was then incised bilaterally with Mayo scissors. Midline. Identified and separated and the peritoneum was  opened. Bladder flap was created over the lower uterine segment. Low transverse incision in the uterus was made and this was extended both cephalad and caudad in standard fashion. The infant was delivered through vertex presentation and was notably vigorous at birth. Delayed cord clamping was accomplished. The umbilical cord was then doubly clamped and cut and the infant was handed off to the awaiting resuscitation team. Cord blood sampling was obtained for ABO Rh typing. Placenta was expressed from the uterine cavity. Uterus was externalized onto the operative field and cleared of all debris with laps. The incision was closed in 2 layers using #1 chromic suture. The first layer was a running locking stitch. The second layer was an imbricating layer with 3 figure-of-eight sutures being placed. Good hemostasis was noted. The uterus was placed back into the abdominal pelvic cavity. Gutters were cleared of all debris with laps. The incision was then closed in layers using 0 Maxon on the fascia in a simple running manner. Subcutaneous tissues were reapproximated using 2-0 Vicryl. Skin was closed with 4-0 Vicryl. Steri-Strips were applied. Dermabond glue was placed over the incision. Lidoderm patch was applied. Patient was then mobilized and taken to recovery room in satisfactory condition.  Julyssa Kyer A. Beatris Si, MD, ACOG [facility]

## 2016-11-29 NOTE — Anesthesia Post-op Follow-up Note (Signed)
Anesthesia QCDR form completed.        

## 2016-11-29 NOTE — Anesthesia Postprocedure Evaluation (Signed)
Anesthesia Post Note  Patient: [name]  Procedure(s) Performed: CESAREAN SECTION (N/A Abdomen)  Patient location during evaluation: Mother Baby Anesthesia Type: Epidural Level of consciousness: awake, awake and alert and oriented Respiratory status: spontaneous breathing, nonlabored ventilation and respiratory function stable Cardiovascular status: stable Postop Assessment: no headache and no backache Anesthetic complications: no     Last Vitals:  Vitals:   11/29/16 0500 11/29/16 0609  BP: 117/64 (!) 108/58  Pulse: 88 96  Resp: 18 18  Temp: 37.1 C 36.6 C  SpO2: 98% 98%    Last Pain:  Vitals:   11/29/16 0609  TempSrc: Oral  PainSc:                  [name]

## 2016-11-29 NOTE — Transfer of Care (Signed)
Immediate Anesthesia Transfer of Care Note  Patient: [name]  Procedure(s) Performed: CESAREAN SECTION (N/A Abdomen)  Patient Location: Mother/Baby  Anesthesia Type:Epidural  Level of Consciousness: awake, alert  and oriented  Airway & Oxygen Therapy: Patient Spontanous Breathing  Post-op Assessment: Report given to RN and Post -op Vital signs reviewed and stable  Post vital signs: Reviewed  Last Vitals:  Vitals:   11/29/16 0005 11/29/16 0201  BP: 95/64 117/84  Pulse: 97 98  Resp:  18  Temp:  36.7 C  SpO2: 97% 99%    Last Pain:  Vitals:   11/28/16 2355  TempSrc: Oral  PainSc:          Complications: No apparent anesthesia complications

## 2016-11-29 NOTE — Progress Notes (Signed)
LABOR CHECK:  SUBJECTIVE: Patient very comfortable with epidural.  OBJECTIVE: BP 95/64   Pulse 97   Temp 98.4 F (36.9 C) (Oral)   Resp 20   Ht  (1.499 m)   Wt 170 lb (77.1 kg)   LMP 01/22/2016 (Exact Date) Comment: Spotted 02/17/16 X 2 days  SpO2 97%   BMI 34.34 kg/m  Comfortable female in no acute distress Cervix: 8-9 cm/90%/-1-0 station Fetal heart rate: Cat 2 with moderate to severe variables.  ASSESSMENT: Arrest of Dilation Non Reassuring FHR Tracing.  PLAN:  Primary LTCS  PRE OP C/S COUNSELING NOTE:  The patient has been counseled regarding indications for Cesarean Section delivery. (See intra partum notes for details). She is understanding of the planned procedure and is aware of and accepting of all surgical risks which include but are not limited to: Bleeding with possible need for blood product transfusions; Infection with possible need for additional antibiotic therapy; Blood Clot Disorders with possible need for blood thinners; Pelvic organ Injury with possible need for repair (Bowel/Bladder/Ureters/Ovaries/Tubes/etc); Anesthesia risks; Fetal Injury; and additional Post Op complications including incision dehiscence and possible future pregnancy placenta implantation issues. All questions have been answered. Informed consent is given.   Mclain Freer A.Beatris Si, MD, F[facility]

## 2016-11-29 NOTE — Plan of Care (Signed)
Problem: Urinary Elimination: Goal: Ability to reestablish a normal urinary elimination pattern will improve Outcome: Not Progressing Pt. Has foley catheter.

## 2016-11-30 ENCOUNTER — Encounter: Payer: Medicaid Other | Admitting: Obstetrics and Gynecology

## 2016-11-30 LAB — RPR: RPR Ser Ql: NONREACTIVE
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
## 2016-11-30 NOTE — Lactation Note (Signed)
This note was copied from a baby's chart. Lactation Consultation Note  Patient Name: [name] [id number] Date: 11/30/2016 Reason for consult: Follow-up assessment;Difficult latch;1st time breastfeeding Only 3% weight loss; sufficient wets voids and stools; No trauma seen on Mom's nipples; long spans of time between some feedings. During this feeding session, Mom did pretty well in getting baby latched on, but then let go of her heavy breast. I gently suggested she support her breast throughout the feedign so he wouldn't lose the latch as easily and so she could do breast compression to help with milk flow and milk transfer. She remained a bit passive about this suggestion and was letting him stop feeding as soon as he let go. I reviewed signs of good feeding (page 21 in BF booklet) and encouraged her to keep feedign him until she heard swallows for several minutes and he no longer showed feeding cues, especially when placed away form warm Mom. When recording the feeding in diary sheet, she wanted me to write that he fed 20 minutes, when I saw and heard him actively nurse for maybe 5 minutes. I reviewed again signs of a feeding vs attempt. He got hiccoughs, so Mom wanted to stop feed and then "take a nap" . Report given to there RN.   Maternal Data Formula Feeding for Exclusion: No Has patient been taught Hand Expression?: Yes Does the patient have breastfeeding experience prior to this delivery?: No  Feeding Feeding Type: Breast Fed Length of feed: 8 min  LATCH Score Latch: Repeated attempts needed to sustain latch, nipple held in mouth throughout feeding, stimulation needed to elicit sucking reflex.  Audible Swallowing: A few with stimulation  Type of Nipple: Everted at rest and after stimulation (right is everted, especially after stim)  Comfort (Breast/Nipple): Soft / non-tender  Hold (Positioning): Assistance needed to correctly position infant at breast and maintain  latch.  LATCH Score: 7  Interventions Interventions: Breast feeding basics reviewed;Assisted with latch;Breast massage;Adjust position;Expressed milk  Lactation Tools Discussed/Used     Consult Status Consult Status: PRN    Sunday Corn 11/30/2016, 10:32 AM
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
## 2016-11-30 NOTE — Progress Notes (Signed)
Subjective: Postpartum Day 2: Cesarean Delivery Patient reports incisional pain and tolerating PO.  No flatus.  Objective: Vital signs in last 24 hours: Temp:  [97.8 F (36.6 C)-99 F (37.2 C)] 98.5 F (36.9 C) (10/02 1211) Pulse Rate:  [76-108] 85 (10/02 0804) Resp:  [16-20] 16 (10/02 0752) BP: (93-126)/(49-79) 126/68 (10/02 0804) SpO2:  [97 %-100 %] 100 % (10/02 0752)  Physical Exam:  General: alert, cooperative and no distress Lochia: appropriate Uterine Fundus: firm Incision: healing well, no significant drainage, no significant erythema DVT Evaluation: No evidence of DVT seen on physical exam.   Recent Labs  11/28/16 1103 11/29/16 0521  HGB 12.7 11.0*  HCT 37.1 32.8*    Assessment/Plan: Status post Cesarean section. Doing well postoperatively.  Continue current care.  Daphine Deutscher A Brooke Payes 11/30/2016, 1:23 PM

## 2016-12-01 MED ORDER — OXYCODONE-ACETAMINOPHEN 5-325 MG PO TABS: 1.0000 | ORAL_TABLET | ORAL | 0 refills | Status: DC | PRN

## 2016-12-01 MED ORDER — IBUPROFEN 800 MG PO TABS: 800.0000 mg | ORAL_TABLET | Freq: Three times a day (TID) | ORAL | 1 refills | Status: DC

## 2016-12-01 MED ORDER — FERROUS SULFATE 325 (65 FE) MG PO TABS: 325.0000 mg | ORAL_TABLET | Freq: Two times a day (BID) | ORAL | 0 refills | Status: DC

## 2016-12-01 MED ORDER — DOCUSATE SODIUM 100 MG PO CAPS: 100.0000 mg | ORAL_CAPSULE | Freq: Two times a day (BID) | ORAL | 0 refills | Status: DC

## 2016-12-01 NOTE — Discharge Summary (Signed)
Physician Obstetric Discharge Summary  Patient ID: Shawntell Dixson MRN: [id number] DOB/AGE: [birth year] 21 y.o.  Reason for Admission: onset of labor Prenatal Procedures: NST and ultrasound Intrapartum Procedures: cesarean: low cervical, transverse Postpartum Procedures: Rubella Ig Complications-Operative and Postpartum: none  Delivery Note At 1:10 AM a viable female was delivered via C-Section, Low Transverse (Presentation:vtx ;  ).  APGAR: 8, 9; weight 6 lb 10.5 oz (3020 g).   Placenta status: , .  Cord:  with the following complications: .  Cord pH: NA  Anesthesia:  Epidural Episiotomy:  NA Lacerations:  NA Est. Blood Loss (mL):  750  Mom to postpartum.  Baby to Couplet care / Skin to Skin.  Daphine Deutscher A Tay Whitwell 12/01/2016, 12:20 PM    APGAR:8 ,9 ; weight 6 lb 10 oz (3005 g).    H/H:  Lab Results  Component Value Date/Time   HGB 11.0 (L) 11/29/2016 05:21 AM   HGB 11.9 09/06/2016 11:39 AM   HCT 32.8 (L) 11/29/2016 05:21 AM   HCT 36.0 09/06/2016 11:39 AM    Brief Hospital Course: Thamar Guerin is a [id number] who underwent cesarean section on 11/28/2016 - 11/29/2016.  Patient had an uncomplicated surgery; for further details of this surgery, please refer to the operative note.  Patient had an uncomplicated postpartum course.  By time of discharge on POD#2, her pain was controlled on oral pain medications; she had appropriate lochia and was ambulating, voiding without difficulty, tolerating regular diet and passing flatus.   She was deemed stable for discharge to home.    Discharge Diagnoses: Term Pregnancy-delivered and s/p LTCS; Arrest of Dilation  Discharge Information: Date: 12/01/2016 Activity: pelvic rest Diet: routine Baby feeding: plans to breastfeed Contraception: condoms Medications: PNV, Ibuprofen, Colace, Iron and Percocet Discharged Condition: good Instructions: refer to practice specific booklet Discharge to: home  Signed: Herold Harms,  MD 12/01/2016, 12:20 PM

## 2016-12-07 ENCOUNTER — Encounter: Payer: Self-pay | Admitting: Obstetrics and Gynecology

## 2016-12-07 ENCOUNTER — Ambulatory Visit (INDEPENDENT_AMBULATORY_CARE_PROVIDER_SITE_OTHER): Payer: Medicaid Other | Admitting: Obstetrics and Gynecology

## 2016-12-07 VITALS — BP 120/79 | HR 97 | Ht 59.0 in | Wt 163.8 lb

## 2016-12-07 DIAGNOSIS — Z98891 History of uterine scar from previous surgery: Secondary | ICD-10-CM

## 2016-12-07 DIAGNOSIS — Z09 Encounter for follow-up examination after completed treatment for conditions other than malignant neoplasm: Secondary | ICD-10-CM

## 2016-12-07 NOTE — Patient Instructions (Signed)
1. Continue with routine postoperative precautions 2. Return in 4 weeks for final postpartum check

## 2016-12-07 NOTE — Progress Notes (Signed)
Chief complaint: 1. Incision check, one week postpartum 2. Status post cesarean section delivery  The patient presents today for post op check 1 week after surgery. She is doing well. Bowel bladder function are normal. Patient is taking 1 Percocet a day (at night). Mild constipation is noted. She is not experiencing any significant abdominal or pelvic pain. Bleeding is minimal. Patient is now bottle feeding. She continues to pump.  Cesarean section was performed due to arrest of dilation and a nonreassuring fetal heart rate tracing  Past medical history, past surgical history, problem list, medications, and allergies are reviewed  OBJECTIVE: BP 120/79   Pulse 97   Ht  (1.499 m)   Wt 163 lb 12.8 oz (74.3 kg)   LMP 01/22/2016 (Exact Date) Comment: Spotted 02/17/16 X 2 days  Breastfeeding? No   BMI 33.08 kg/m  Pleasant well-appearing female in no acute distress. Alert and oriented. Abdomen: Soft, nontender; Pfannenstiel skin incision is well approximated without evidence of erythema, drainage, or induration. One third of the Steri-Strips are removed. Pelvic: Deferred  ASSESSMENT: 1. One week status post primary low cervical transverse cesarean section for arrest of dilation and nonreassuring fetal heart rate tracing 2. Normal incision check  PLAN: 1. Continue with routine postoperative precautions 2. Return in 5 weeks for final postpartum check 3. Continue with prenatal vitamins daily  Herold Harms, MD  Note: This dictation was prepared with Dragon dictation along with smaller phrase technology. Any transcriptional errors that result from this process are unintentional.

## 2016-12-09 ENCOUNTER — Encounter: Payer: Self-pay | Admitting: Certified Nurse Midwife

## 2017-01-11 ENCOUNTER — Encounter: Payer: Self-pay | Admitting: Obstetrics and Gynecology

## 2017-01-11 ENCOUNTER — Ambulatory Visit (INDEPENDENT_AMBULATORY_CARE_PROVIDER_SITE_OTHER): Payer: Medicaid Other | Admitting: Obstetrics and Gynecology

## 2017-01-11 DIAGNOSIS — Z98891 History of uterine scar from previous surgery: Secondary | ICD-10-CM

## 2017-01-11 DIAGNOSIS — E669 Obesity, unspecified: Secondary | ICD-10-CM

## 2017-01-11 DIAGNOSIS — Z308 Encounter for other contraceptive management: Secondary | ICD-10-CM

## 2017-01-11 DIAGNOSIS — Z720 Tobacco use: Secondary | ICD-10-CM | POA: Insufficient documentation

## 2017-01-11 LAB — POCT URINE PREGNANCY: PREG TEST UR: NEGATIVE

## 2017-01-11 MED ORDER — ETONOGESTREL-ETHINYL ESTRADIOL 0.12-0.015 MG/24HR VA RING: VAGINAL_RING | VAGINAL | 12 refills | Status: DC

## 2017-01-11 NOTE — Progress Notes (Signed)
Chief complaint: 1.  6-week postpartum check 2.  Status post primary low cervical transverse cesarean section for arrest of dilation  [name] presents today for her final postpartum check.  She is status post primary low cervical transverse cesarean section for viable female infant due to arrest of dilation at 7-8 cm and with a nonreassuring fetal heart rate tracing.  Her postop course was uneventful.  She did breast-feed for 2 weeks; she now is bottlefeeding. [name] has had some emotional lability without significant overt postpartum depression.  PHQ-9 questionnaire is 8. Bowel function and bladder function are normal. She is not experiencing any significant abdominal or pelvic pain. She is now just finishing her first menstrual cycle postpartum.  Past Medical History:  Diagnosis Date  . Abdominal pain   . Asthma   . History of hypertension   . Hypertension   . Nausea   . Peptic ulcer disease   . Pre-diabetes (history)     Past Surgical History:  Procedure Laterality Date  . TONSILLECTOMY      Review of Systems  Constitutional: Negative.   HENT: Negative.   Respiratory: Negative.   Cardiovascular: Negative.   Genitourinary: Negative.        Currently on menses  Musculoskeletal: Negative.   Skin: Negative.   Neurological: Negative.   Endo/Heme/Allergies: Negative.   Psychiatric/Behavioral: Negative.    OBJECTIVE: BP 115/70   Pulse 98   Ht 4\' 11"  (1.499 m)   Wt 165 lb (74.8 kg)   BMI 33.33 kg/m  Pleasant well-appearing female no acute distress.  Alert and oriented. HEENT exam-normocephalic atraumatic Neck-supple without thyromegaly or adenopathy Lungs-clear Heart-regular rate and rhythm without murmur Breasts-no dominant mass adenopathy or nipple discharge Abdomen: Soft, nontender without organomegaly; Pfannenstiel skin incision is well approximated and healed without evidence of hernia Pelvic exam: External genitalia-normal BUS-normal Vagina-normal Cervix-no  lesions; minimal menstrual blood at office Uterus-top normal size, mobile, nontender Adnexa-nonpalpable nontender Extremities-warm and dry  ASSESSMENT: 1.  Normal 6-week postpartum check 2.  Status post primary low cervical transverse cesarean section 3.  Desires hormonal contraception 4.  Tobacco user  PLAN: 1.  Resume activities as tolerated 2.  Begin NuvaRing for contraception; use backup for the first month 3.  Smoking cessation is strongly encouraged 4.  Return in 6 months for annual exam  Herold HarmsMartin A Raychell Holcomb, MD  Note: This dictation was prepared with Dragon dictation along with smaller phrase technology. Any transcriptional errors that result from this process are unintentional.

## 2017-01-11 NOTE — Patient Instructions (Signed)
1.  Resume all activities as tolerated 2.  Begin using the NuvaRing this Sunday 3.  Use backup contraception (condoms) for the first month of NuvaRing therapy 4.  Recommend smoking cessation 5.  Return in 6 months for  annual physical

## 2017-02-04 ENCOUNTER — Telehealth: Payer: Self-pay | Admitting: Obstetrics and Gynecology

## 2017-02-04 NOTE — Telephone Encounter (Signed)
The Patient called and stated that she is currently using the Nuva ring and would like to switch to using the patch, The patient would like to speak with a nurse in regards to what she should do moving forward to make that change. No other information was disclosed. Please advise.

## 2017-02-04 NOTE — Telephone Encounter (Signed)
Pt aware message sent to mad. Pt aware you will get this message on Monday.

## 2017-02-08 MED ORDER — NORELGESTROMIN-ETH ESTRADIOL 150-35 MCG/24HR TD PTWK: 1.0000 | MEDICATED_PATCH | TRANSDERMAL | 12 refills | Status: DC

## 2017-02-08 NOTE — Telephone Encounter (Signed)
Pt states she never tried nuvaring. Has used the patch in the past. Advised pt to start Sunday after next cycle. Use back up x 1 month.

## 2017-02-08 NOTE — Addendum Note (Signed)
Addended by: Marchelle FolksMILLER, Boysie Bonebrake G on: 02/08/2017 04:35 PM   Modules accepted: Orders

## 2017-03-01 NOTE — L&D Delivery Note (Signed)
Delivery Summary for [name]  Labor Events:   Preterm labor:   Rupture date:   Rupture time:   Rupture type: Intact Possible ROM - for evaluation  Fluid Color:   Induction:   Augmentation:   Complications:   Cervical ripening:          Delivery:   Episiotomy:   Lacerations:   Repair suture:   Repair # of packets:   Blood loss (ml): 600   Information for the patient's newborn:  [name], [name] [[id number]]    Delivery 12/30/2017 8:06 AM by  C-Section, Low Transverse Sex:  female Gestational Age: [redacted]w[redacted]d Delivery Clinician:   Living?:         APGARS  One minute Five minutes Ten minutes  Skin color:        Heart rate:        Grimace:        Muscle tone:        Breathing:        Totals: 8  9      Presentation/position:      Resuscitation:   Cord information:    Disposition of cord blood:     Blood gases sent?  Complications:   Placenta: Delivered:       appearance Newborn Measurements: Weight: 6 lb 11.2 oz (3040 g)  Height:    Head circumference:    Chest circumference:    Other providers:    Additional  information: Forceps:   Vacuum:   Breech:   Observed anomalies        See Dr. Oretha Milch operative note for details of procedure.    Hildred Laser, MD [facility]

## 2017-05-03 ENCOUNTER — Other Ambulatory Visit (INDEPENDENT_AMBULATORY_CARE_PROVIDER_SITE_OTHER): Payer: Medicaid Other

## 2017-05-03 ENCOUNTER — Ambulatory Visit (INDEPENDENT_AMBULATORY_CARE_PROVIDER_SITE_OTHER): Payer: Medicaid Other | Admitting: Obstetrics and Gynecology

## 2017-05-03 ENCOUNTER — Encounter: Payer: Self-pay | Admitting: Obstetrics and Gynecology

## 2017-05-03 VITALS — BP 103/68 | HR 99 | Ht 59.0 in | Wt 166.8 lb

## 2017-05-03 DIAGNOSIS — N912 Amenorrhea, unspecified: Secondary | ICD-10-CM

## 2017-05-03 DIAGNOSIS — F1990 Other psychoactive substance use, unspecified, uncomplicated: Secondary | ICD-10-CM | POA: Diagnosis not present

## 2017-05-03 DIAGNOSIS — Z98891 History of uterine scar from previous surgery: Secondary | ICD-10-CM

## 2017-05-03 DIAGNOSIS — Z720 Tobacco use: Secondary | ICD-10-CM | POA: Diagnosis not present

## 2017-05-03 DIAGNOSIS — O219 Vomiting of pregnancy, unspecified: Secondary | ICD-10-CM | POA: Diagnosis not present

## 2017-05-03 DIAGNOSIS — Z3687 Encounter for antenatal screening for uncertain dates: Secondary | ICD-10-CM

## 2017-05-03 DIAGNOSIS — Z3201 Encounter for pregnancy test, result positive: Secondary | ICD-10-CM

## 2017-05-03 DIAGNOSIS — R638 Other symptoms and signs concerning food and fluid intake: Secondary | ICD-10-CM

## 2017-05-03 LAB — POCT URINE PREGNANCY: PREG TEST UR: POSITIVE — AB

## 2017-05-03 MED ORDER — DOXYLAMINE-PYRIDOXINE 10-10 MG PO TBEC: 10.0000 mg | DELAYED_RELEASE_TABLET | Freq: Every day | ORAL | 1 refills | Status: DC

## 2017-05-03 NOTE — Progress Notes (Signed)
GYN ENCOUNTER NOTE  Subjective:       [name] is a 22 y.o. [id number] female is here for gynecologic evaluation of the following issues:  1.  Pregnancy confirmation  Patient reports mild nausea without vomiting; she is experiencing some breast tenderness.  She denies vaginal bleeding or pelvic pain. Last menstrual period was 03/03/2017-maybe?  Prenatal risk factors: 1.  History of cesarean section delivery; arrest of dilation at 8/90/-1 and nonreassuring fetal heart rate tracing 2.  Tobacco user 3.  Obesity 4.  Anxiety/depression   Gynecologic History Patient's last menstrual period was 03/03/2017 (exact date).  Obstetric History OB History  Gravida Para Term Preterm AB Living  3 1 1   2 1   SAB TAB Ectopic Multiple Live Births  2     0 1    # Outcome Date GA Lbr Len/2nd Weight Sex Delivery Anes PTL Lv  3 Term 11/29/16 [redacted]w[redacted]d  6 lb 10.5 oz (3.02 kg) M CS-LTranv EPI  LIV  2 SAB 2015        ND  1 SAB 2012        ND      Past Medical History:  Diagnosis Date  . Abdominal pain   . Asthma   . History of hypertension   . Hypertension   . Nausea   . Peptic ulcer disease   . Pre-diabetes (history)     Past Surgical History:  Procedure Laterality Date  . CESAREAN SECTION N/A 11/29/2016   Procedure: CESAREAN SECTION;  Surgeon: Herold Harms, MD;  Location: [facility] ORS;  Service: Obstetrics;  Laterality: N/A;  . TONSILLECTOMY    . WISDOM TOOTH EXTRACTION      Current Outpatient Medications on File Prior to Visit  Medication Sig Dispense Refill  . cyclobenzaprine (FLEXERIL) 5 MG tablet Take 5 mg 3 (three) times daily as needed by mouth for muscle spasms.    Marland Kitchen omeprazole (PRILOSEC) 20 MG capsule Take 20 mg daily by mouth.     No current facility-administered medications on file prior to visit.     Allergies  Allergen Reactions  . Metformin And Related   . Sprintec 28 [Norgestimate-Eth Estradiol]     Social History   Socioeconomic History  . Marital status:  Single    Spouse name: Not on file  . Number of children: Not on file  . Years of education: Not on file  . Highest education level: Not on file  Social Needs  . Financial resource strain: Not on file  . Food insecurity - worry: Not on file  . Food insecurity - inability: Not on file  . Transportation needs - medical: Not on file  . Transportation needs - non-medical: Not on file  Occupational History  . Not on file  Tobacco Use  . Smoking status: Current Every Day Smoker    Packs/day: 0.25    Types: Cigarettes    Last attempt to quit: 03/24/2016    Years since quitting: 1.1  . Smokeless tobacco: Never Used  Substance and Sexual Activity  . Alcohol use: No  . Drug use: Yes    Types: Marijuana    Comment: every day  . Sexual activity: Yes    Partners: Male    Birth control/protection: None  Other Topics Concern  . Not on file  Social History Narrative   11th grade 2014-2015    Family History  Problem Relation Age of Onset  . Cholelithiasis Mother   . SIDS Brother   .  Cholelithiasis Maternal Grandmother   . Ulcers Maternal Grandmother   . Diabetes Maternal Grandfather   . Celiac disease Neg Hx     The following portions of the patient's history were reviewed and updated as appropriate: allergies, current medications, past family history, past medical history, past social history, past surgical history and problem list.  Review of Systems Review of Systems -comprehensive review of systems is negative except for that noted in the HPI  Objective:   BP 103/68   Pulse 99   Ht 4\' 11"  (1.499 m)   Wt 166 lb 12.8 oz (75.7 kg)   LMP 03/03/2017 (Exact Date)   Breastfeeding? No   BMI 33.69 kg/m  CONSTITUTIONAL: Well-developed, well-nourished female in no acute distress.  Physical exam deferred   Assessment:   1. Amenorrhea - POCT urine pregnancy - US OB Transvaginal; Future - US OB Comp Less 14 Wks; Future  2. Unsure of LMP (last menstrual period) as reason for  ultrasound scan - US OB Transvaginal; Future - US OB Comp Less 14 Wks; Future  3. Tobacco user; smoking cessation is encouraged  4. Drug use; recommend stopping THC use  5. Nausea/vomiting in pregnancy; diclegis prescription available  6. Positive urine pregnancy test  7. History of C-section; recommend repeat cesarean section delivery considering prior delivery information; patient may proceed with a trial of labor if she strongly desires  8. Increased BMI; early Glucola screening is recommended   Plan:   1.  Pelvic ultrasound to confirm EDD and fetal viability 2.  New OB prenatal intake visit with nurse 2 weeks 3.  New OB history and physical intake with physician in 4 weeks 4.  Prenatal vitamins daily 5.  Diclegis prescription for nausea and vomiting New OB counseling: The patient has been given an overview regarding routine prenatal care. Recommendations regarding diet, weight gain, and exercise in pregnancy were given. Prenatal testing, optional genetic testing, and ultrasound use in pregnancy were reviewed.  Benefits of Breast Feeding were discussed. The patient is encouraged to consider nursing her baby post partum.  A total of 25 minutes were spent face-to-face with the patient during this encounter and over half of that time involved counseling and coordination of care.  Herold HarmsMartin A Jenay Morici, MD  Note: This dictation was prepared with Dragon dictation along with smaller phrase technology. Any transcriptional errors that result from this process are unintentional.   None

## 2017-05-03 NOTE — Patient Instructions (Signed)
1.  Ultrasound today to confirm EDD 2.  Prenatal vitamins daily 3.  Diclegis prescription is given for nausea and vomiting 4.  Return in 2 weeks for new OB nursing intake 5.  Return in 4 weeks for new OB history and physical-with physician

## 2017-05-04 LAB — BETA HCG QUANT (REF LAB): HCG QUANT: 3982 m[IU]/mL

## 2017-05-05 ENCOUNTER — Other Ambulatory Visit: Payer: Self-pay

## 2017-05-05 ENCOUNTER — Other Ambulatory Visit: Payer: Medicaid Other

## 2017-05-05 ENCOUNTER — Telehealth: Payer: Self-pay | Admitting: Obstetrics and Gynecology

## 2017-05-05 DIAGNOSIS — N912 Amenorrhea, unspecified: Secondary | ICD-10-CM

## 2017-05-05 NOTE — Telephone Encounter (Signed)
Pt aware we need beta results from today before we can make a plan. Will call with results when available.

## 2017-05-05 NOTE — Telephone Encounter (Signed)
The patient was in the office for lab work and stated that she would like a call to let her know what she needs to schedule moving forward.. No other information was disclosed. Please advise.

## 2017-05-06 LAB — BETA HCG QUANT (REF LAB): hCG Quant: 6952 m[IU]/mL

## 2017-05-11 ENCOUNTER — Ambulatory Visit (INDEPENDENT_AMBULATORY_CARE_PROVIDER_SITE_OTHER): Payer: Medicaid Other

## 2017-05-11 DIAGNOSIS — O99320 Drug use complicating pregnancy, unspecified trimester: Secondary | ICD-10-CM | POA: Insufficient documentation

## 2017-05-11 DIAGNOSIS — O209 Hemorrhage in early pregnancy, unspecified: Secondary | ICD-10-CM

## 2017-05-11 DIAGNOSIS — R638 Other symptoms and signs concerning food and fluid intake: Secondary | ICD-10-CM | POA: Insufficient documentation

## 2017-05-11 DIAGNOSIS — O2621 Pregnancy care for patient with recurrent pregnancy loss, first trimester: Secondary | ICD-10-CM

## 2017-05-11 DIAGNOSIS — Z8489 Family history of other specified conditions: Secondary | ICD-10-CM

## 2017-05-11 DIAGNOSIS — Z3687 Encounter for antenatal screening for uncertain dates: Secondary | ICD-10-CM | POA: Insufficient documentation

## 2017-05-11 DIAGNOSIS — Z3481 Encounter for supervision of other normal pregnancy, first trimester: Secondary | ICD-10-CM

## 2017-05-11 DIAGNOSIS — F1290 Cannabis use, unspecified, uncomplicated: Secondary | ICD-10-CM | POA: Insufficient documentation

## 2017-05-11 DIAGNOSIS — F1990 Other psychoactive substance use, unspecified, uncomplicated: Secondary | ICD-10-CM | POA: Insufficient documentation

## 2017-05-31 ENCOUNTER — Ambulatory Visit: Payer: Medicaid Other

## 2017-05-31 VITALS — Ht 59.0 in | Wt 160.4 lb

## 2017-05-31 DIAGNOSIS — Z3A09 9 weeks gestation of pregnancy: Secondary | ICD-10-CM

## 2017-05-31 LAB — OB RESULTS CONSOLE VARICELLA ZOSTER ANTIBODY, IGG: Varicella: IMMUNE

## 2017-05-31 NOTE — Progress Notes (Unsigned)
[name] presents for NOB nurse interview visit. Pregnancy confirmation done here at [facility].  G-4  P-1    . Pregnancy education material explained and given. _No__ cats in the home. NOB labs ordered. HIV labs and Drug screen were explained optional and she did not decline. Drug screen ordered. PNV encouraged. Genetic screening options discussed. Genetic testing: Ordered.  Pt may discuss with provider. Pt. To follow up with provider in _3_ weeks for NOB physical.  All questions answered.

## 2017-06-01 ENCOUNTER — Other Ambulatory Visit: Payer: Self-pay | Admitting: Obstetrics and Gynecology

## 2017-06-01 LAB — CBC WITH DIFFERENTIAL/PLATELET
BASOS ABS: 0 10*3/uL (ref 0.0–0.2)
Basos: 0 %
EOS (ABSOLUTE): 0.1 10*3/uL (ref 0.0–0.4)
Eos: 1 %
HEMATOCRIT: 37.6 % (ref 34.0–46.6)
Hemoglobin: 12.2 g/dL (ref 11.1–15.9)
Immature Grans (Abs): 0 10*3/uL (ref 0.0–0.1)
Immature Granulocytes: 0 %
LYMPHS ABS: 2.6 10*3/uL (ref 0.7–3.1)
Lymphs: 32 %
MCH: 26.6 pg (ref 26.6–33.0)
MCHC: 32.4 g/dL (ref 31.5–35.7)
MCV: 82 fL (ref 79–97)
MONOCYTES: 6 %
MONOS ABS: 0.5 10*3/uL (ref 0.1–0.9)
NEUTROS ABS: 4.8 10*3/uL (ref 1.4–7.0)
Neutrophils: 61 %
Platelets: 248 10*3/uL (ref 150–379)
RBC: 4.59 x10E6/uL (ref 3.77–5.28)
RDW: 15.4 % (ref 12.3–15.4)
WBC: 7.9 10*3/uL (ref 3.4–10.8)

## 2017-06-01 LAB — ABO AND RH: RH TYPE: POSITIVE

## 2017-06-01 LAB — ANTIBODY SCREEN: ANTIBODY SCREEN: NEGATIVE

## 2017-06-01 LAB — RUBELLA SCREEN: RUBELLA: 2.21 {index} (ref >0.99)

## 2017-06-01 LAB — VARICELLA ZOSTER ANTIBODY, IGG: VARICELLA: 656 {index} (ref >165)

## 2017-06-01 LAB — HEPATITIS B SURFACE ANTIGEN: HEP B S AG: NEGATIVE

## 2017-06-01 LAB — RPR: RPR Ser Ql: NONREACTIVE

## 2017-06-01 LAB — HIV ANTIBODY (ROUTINE TESTING W REFLEX): HIV Screen 4th Generation wRfx: NONREACTIVE

## 2017-06-02 ENCOUNTER — Encounter: Payer: Self-pay | Admitting: Obstetrics and Gynecology

## 2017-06-20 ENCOUNTER — Ambulatory Visit (INDEPENDENT_AMBULATORY_CARE_PROVIDER_SITE_OTHER): Payer: Medicaid Other | Admitting: Obstetrics and Gynecology

## 2017-06-20 ENCOUNTER — Encounter: Payer: Self-pay | Admitting: Obstetrics and Gynecology

## 2017-06-20 ENCOUNTER — Encounter: Payer: Medicaid Other | Admitting: Obstetrics and Gynecology

## 2017-06-20 VITALS — BP 107/72 | HR 97 | Wt 157.1 lb

## 2017-06-20 DIAGNOSIS — Z202 Contact with and (suspected) exposure to infections with a predominantly sexual mode of transmission: Secondary | ICD-10-CM

## 2017-06-20 DIAGNOSIS — Z3481 Encounter for supervision of other normal pregnancy, first trimester: Secondary | ICD-10-CM

## 2017-06-20 LAB — POCT URINALYSIS DIPSTICK
Blood, UA: NEGATIVE
Glucose, UA: NEGATIVE
Ketones, UA: NEGATIVE
NITRITE UA: NEGATIVE
ODOR: NEGATIVE
PH UA: 6 (ref 5.0–8.0)
Spec Grav, UA: >=1.03 — AB (ref 1.010–1.025)
UROBILINOGEN UA: 0.2 U/dL

## 2017-06-20 NOTE — Progress Notes (Signed)
NOB: Patient generally well.  Occasional nausea but denies vomiting.  Using laxatives for constipation.  Desires MaterniT 21.  Taking prenatal vitamins.  Physical examination General NAD, Conversant  HEENT Atraumatic; Op clear with mmm.  Normo-cephalic. Pupils reactive. Anicteric sclerae  Thyroid/Neck Smooth without nodularity or enlargement. Normal ROM.  Neck Supple.  Skin No rashes, lesions or ulceration. Normal palpated skin turgor. No nodularity.  Breasts: No masses or discharge.  Symmetric.  No axillary adenopathy.  Lungs: Clear to auscultation.No rales or wheezes. Normal Respiratory effort, no retractions.  Heart: NSR.  No murmurs or rubs appreciated. No periferal edema  Abdomen: Soft.  Non-tender.  No masses.  No HSM. No hernia  Extremities: Moves all appropriately.  Normal ROM for age. No lymphadenopathy.  Neuro: Oriented to PPT.  Normal mood. Normal affect.     Pelvic:   Vulva: Normal appearance.  No lesions.  Vagina: No lesions or abnormalities noted.  Support: Normal pelvic support.  Urethra No masses tenderness or scarring.  Meatus Normal size without lesions or prolapse.  Cervix: Normal appearance.  No lesions.  Anus: Normal exam.  No lesions.  Perineum: Normal exam.  No lesions.        Bimanual   Adnexae: No masses.  Non-tender to palpation.  Uterus: Enlarged. 13wks  Non-tender.  Mobile.  AV. POs FHTs  Adnexae: No masses.  Non-tender to palpation.  Cul-de-sac: Negative for abnormality.  Adnexae: No masses.  Non-tender to palpation.         Pelvimetry   Diagonal: Reached.  Spines: Average.  Sacrum: Concave.  Pubic Arch: Normal.   Pap, GC/CT performed MaterniT 21

## 2017-06-20 NOTE — Progress Notes (Signed)
NOB PE- c/o of constipation. Using laxatives and miralax. Maternit 21 done.

## 2017-06-22 LAB — PAP IG, CT-NG, RFX HPV ASCU
CHLAMYDIA, NUC. ACID AMP: NEGATIVE
GONOCOCCUS BY NUCLEIC ACID AMP: NEGATIVE
PAP SMEAR COMMENT: 0

## 2017-06-24 ENCOUNTER — Other Ambulatory Visit: Payer: Self-pay

## 2017-06-24 ENCOUNTER — Emergency Department
Admission: EM | Admit: 2017-06-24 | Discharge: 2017-06-24 | Disposition: A | Discharge status: Home / Self Care | Payer: Medicaid Other | Attending: Student in an Organized Health Care Education/Training Program | Admitting: Student in an Organized Health Care Education/Training Program

## 2017-06-24 ENCOUNTER — Encounter: Payer: Self-pay | Admitting: Emergency Medicine

## 2017-06-24 DIAGNOSIS — Z3A13 13 weeks gestation of pregnancy: Secondary | ICD-10-CM | POA: Diagnosis not present

## 2017-06-24 DIAGNOSIS — O26891 Other specified pregnancy related conditions, first trimester: Secondary | ICD-10-CM | POA: Insufficient documentation

## 2017-06-24 DIAGNOSIS — O9952 Diseases of the respiratory system complicating childbirth: Secondary | ICD-10-CM | POA: Diagnosis not present

## 2017-06-24 DIAGNOSIS — O2341 Unspecified infection of urinary tract in pregnancy, first trimester: Secondary | ICD-10-CM | POA: Diagnosis not present

## 2017-06-24 DIAGNOSIS — O9989 Other specified diseases and conditions complicating pregnancy, childbirth and the puerperium: Secondary | ICD-10-CM

## 2017-06-24 DIAGNOSIS — M549 Dorsalgia, unspecified: Secondary | ICD-10-CM

## 2017-06-24 DIAGNOSIS — N390 Urinary tract infection, site not specified: Secondary | ICD-10-CM

## 2017-06-24 DIAGNOSIS — F17210 Nicotine dependence, cigarettes, uncomplicated: Secondary | ICD-10-CM | POA: Insufficient documentation

## 2017-06-24 DIAGNOSIS — J45909 Unspecified asthma, uncomplicated: Secondary | ICD-10-CM | POA: Diagnosis not present

## 2017-06-24 DIAGNOSIS — O99891 Other specified diseases and conditions complicating pregnancy: Secondary | ICD-10-CM

## 2017-06-24 LAB — URINALYSIS, COMPLETE (UACMP) WITH MICROSCOPIC
BILIRUBIN URINE: NEGATIVE
GLUCOSE, UA: NEGATIVE mg/dL
HGB URINE DIPSTICK: NEGATIVE
KETONES UR: NEGATIVE mg/dL
NITRITE: NEGATIVE
PH: 5 (ref 5.0–8.0)
Protein, ur: NEGATIVE mg/dL
Specific Gravity, Urine: 1.025 (ref 1.005–1.030)

## 2017-06-24 LAB — MATERNIT 21 PLUS CORE, BLOOD
CHROMOSOME 18: NEGATIVE
Chromosome 13: NEGATIVE
Chromosome 21: NEGATIVE
Y CHROMOSOME: NOT DETECTED

## 2017-06-24 MED ORDER — NITROFURANTOIN MONOHYD MACRO 100 MG PO CAPS: 100.0000 mg | ORAL_CAPSULE | Freq: Two times a day (BID) | ORAL | 0 refills | Status: DC

## 2017-06-24 NOTE — ED Provider Notes (Signed)
[facility] Emergency Department Provider Note  ____________________________________________   First MD Initiated Contact with Patient 06/24/17 Paulo Fruit     (approximate)  I have reviewed the triage vital signs and the nursing notes.   HISTORY  Chief Complaint Back Pain    HPI [name] is a 22 y.o. female presents emergency department complaining of left lower back pain since Tuesday.  She states it radiates to the left buttock and sometimes into the thigh.  She states she has had chronic problems with her back and usually takes Flexeril for her back but obviously she is not taking this as she is pregnant.  She states only thing she has taken is Tylenol.  She states that Tylenol "just is not cutting it ".  She is unsure if she has a UTI.  She has had an ultrasound to determine how many weeks pregnant she is in the fetus is in the uterus.  Past Medical History:  Diagnosis Date  . Abdominal pain   . Asthma   . History of hypertension   . Hypertension   . Nausea   . Peptic ulcer disease   . Pre-diabetes (history)     Patient Active Problem List   Diagnosis Date Noted  . Increased BMI 05/11/2017  . Drug use 05/11/2017  . Unsure of LMP (last menstrual period) as reason for ultrasound scan 05/11/2017  . Tobacco user 01/11/2017  . History of C-section 11/29/2016  . Rubella non-immune status, antepartum 04/19/2016  . Nausea/vomiting in pregnancy   . Acquired acanthosis nigricans 11/11/2010  . Depressive disorder 07/19/2010  . Obesity (BMI 30.0-34.9) 07/19/2010  . Extrinsic asthma 04/03/2009    Past Surgical History:  Procedure Laterality Date  . CESAREAN SECTION N/A 11/29/2016   Procedure: CESAREAN SECTION;  Surgeon: Herold Harms, MD;  Location: [facility] ORS;  Service: Obstetrics;  Laterality: N/A;  . TONSILLECTOMY    . WISDOM TOOTH EXTRACTION      Prior to Admission medications   Medication Sig Start Date End Date Taking? Authorizing  Provider  nitrofurantoin, macrocrystal-monohydrate, (MACROBID) 100 MG capsule Take 1 capsule (100 mg total) by mouth 2 (two) times daily. 06/24/17   Radin Raptis, Roselyn Bering, PA-C  ondansetron (ZOFRAN) 4 MG tablet TAKE 1 TABLET BY MOUTH EVERY 8 HOURS AS NEEDED FOR NAUSEA AND VOMITING 06/02/17   Hildred Laser, MD  Prenatal Vit-Fe Fumarate-FA (MULTIVITAMIN-PRENATAL) 27-0.8 MG TABS tablet Take 1 tablet by mouth daily at 12 noon.    [provider]    Allergies Metformin and related and Sprintec 28 [norgestimate-eth estradiol]  Family History  Problem Relation Age of Onset  . Cholelithiasis Mother   . SIDS Brother   . Cholelithiasis Maternal Grandmother   . Ulcers Maternal Grandmother   . Diabetes Maternal Grandfather   . Celiac disease Neg Hx     Social History Social History   Tobacco Use  . Smoking status: Current Every Day Smoker    Packs/day: 0.25    Types: Cigarettes    Last attempt to quit: 03/24/2016    Years since quitting: 1.2  . Smokeless tobacco: Never Used  Substance Use Topics  . Alcohol use: No  . Drug use: Yes    Types: Marijuana    Comment: every day    Review of Systems  Constitutional: No fever/chills Eyes: No visual changes. ENT: No sore throat. Respiratory: Denies cough Genitourinary: Negative for dysuria. Musculoskeletal:  positive for for back pain. Skin: Negative for rash.    ____________________________________________  PHYSICAL EXAM:  VITAL SIGNS: ED Triage Vitals  Enc Vitals Group     BP 06/24/17 1826 115/67     Pulse Rate 06/24/17 1826 93     Resp 06/24/17 1826 16     Temp 06/24/17 1826 98 F (36.7 C)     Temp Source 06/24/17 1826 Oral     SpO2 06/24/17 1826 100 %     Weight 06/24/17 1828 157 lb (71.2 kg)     Height 06/24/17 1828 4\' 11"  (1.499 m)     Head Circumference --      Peak Flow --      Pain Score 06/24/17 1824 6     Pain Loc --      Pain Edu? --      Excl. in GC? --     Constitutional: Alert and oriented. Well  appearing and in no acute distress. Eyes: Conjunctivae are normal.  Head: Atraumatic. Nose: No congestion/rhinnorhea. Mouth/Throat: Mucous membranes are moist.   Cardiovascular: Normal rate, regular rhythm.  Heart sounds are normal Respiratory: Normal respiratory effort.  No retractions, lungs are clear to auscultation GU: deferred Musculoskeletal: FROM all extremities, warm and well perfused.  The left side of her back is tender along the left paravertebral muscles in the SI joint.  The patient moves easily and is neurovascularly intact Neurologic:  Normal speech and language.  Skin:  Skin is warm, dry and intact. No rash noted. Psychiatric: Mood and affect are normal. Speech and behavior are normal.  ____________________________________________   LABS (all labs ordered are listed, but only abnormal results are displayed)  Labs Reviewed  URINALYSIS, COMPLETE (UACMP) WITH MICROSCOPIC - Abnormal; Notable for the following components:      Result Value   Color, Urine YELLOW (*)    APPearance HAZY (*)    Leukocytes, UA TRACE (*)    Bacteria, UA RARE (*)    All other components within normal limits   ____________________________________________   ____________________________________________  RADIOLOGY    ____________________________________________   PROCEDURES  Procedure(s) performed: No  Procedures    ____________________________________________   INITIAL IMPRESSION / ASSESSMENT AND PLAN / ED COURSE  Pertinent labs & imaging results that were available during my care of the patient were reviewed by me and considered in my medical decision making (see chart for details).  patient is a 22 year old female presented emergency department complaining of low back pain.  She is [redacted] weeks pregnant.  She has been evaluated by the GYN.  She states the back pain is kind of a chronic issue.  She has been taking Flexeril prior to her pregnancy.  Since she is pregnant she cannot  take Flexeril.  She is requesting pain medication.  On physical exam the patient appears well.  The lumbar spine is tender along the left paravertebrals in the SI joint.  She has full range of motion is neurovascularly intact.  Urinalysis was ordered to determine if she has any UTI  UA shows trace of leuks and trace of bacteria  Patient was given a prescription for Macrobid 100 mg twice daily for 7 days.  She is to follow-up with her GYN for any other issues.  Explained to her that since she is pregnant the only medication she can takes Tylenol for pain.  She should apply ice to the lower back assist will decrease inflammation.  She should performs stretches and core muscle exercises to strengthen her back as this is very early in her pregnancy.  She states she  understands will comply with our instructions.  She was discharged in stable condition     As part of my medical decision making, I reviewed the following data within the electronic MEDICAL RECORD NUMBER Nursing notes reviewed and incorporated, Labs reviewed UA shows trace leuks and bacteria, Notes from prior ED visits and [place] Controlled Substance Database  ____________________________________________   FINAL CLINICAL IMPRESSION(S) / ED DIAGNOSES  Final diagnoses:  Lower urinary tract infectious disease  Back pain in pregnancy      NEW MEDICATIONS STARTED DURING THIS VISIT:  Discharge Medication List as of 06/24/2017  7:25 PM    START taking these medications   Details  nitrofurantoin, macrocrystal-monohydrate, (MACROBID) 100 MG capsule Take 1 capsule (100 mg total) by mouth 2 (two) times daily., Starting Fri 06/24/2017, Print         Note:  This document was prepared using Dragon voice recognition software and may include unintentional dictation errors.    Faythe GheeFisher, Adonijah Baena W, PA-C 06/24/17 2334    Sharyn CreamerQuale, Mark, MD 06/25/17 [phone]

## 2017-06-24 NOTE — ED Triage Notes (Addendum)
Left lower back pain x 5 days.  Pain radiates down right leg.  Patient is AAOx3.  Skin warm and dry. NAD

## 2017-06-24 NOTE — Discharge Instructions (Addendum)
Follow-up with your regular doctor if not better in 3 to 5 days.  Return to emergency department if you are worsening.  Take over-the-counter Tylenol.  Apply ice or wet heat to your lower back.  Ice will help more with inflammation.

## 2017-06-24 NOTE — ED Notes (Signed)
See triage note   Presents with left lower back pain since Tuesday   States pain is non radiating  Ambulates well  Denies any injury or urinary sx's.. Pt is 13 weeks preg..[name]

## 2017-06-29 ENCOUNTER — Other Ambulatory Visit: Payer: Self-pay

## 2017-06-29 ENCOUNTER — Encounter: Payer: Self-pay | Admitting: Obstetrics and Gynecology

## 2017-06-29 MED ORDER — ONDANSETRON HCL 4 MG PO TABS: ORAL_TABLET | ORAL | 1 refills | Status: DC

## 2017-06-30 ENCOUNTER — Emergency Department
Admission: EM | Admit: 2017-06-30 | Discharge: 2017-06-30 | Disposition: A | Discharge status: Home / Self Care | Payer: Medicaid Other | Attending: Emergency Medicine | Admitting: Emergency Medicine

## 2017-06-30 ENCOUNTER — Emergency Department: Payer: Medicaid Other

## 2017-06-30 ENCOUNTER — Telehealth: Payer: Self-pay | Admitting: Obstetrics and Gynecology

## 2017-06-30 ENCOUNTER — Other Ambulatory Visit: Payer: Self-pay

## 2017-06-30 DIAGNOSIS — F17210 Nicotine dependence, cigarettes, uncomplicated: Secondary | ICD-10-CM | POA: Insufficient documentation

## 2017-06-30 DIAGNOSIS — N898 Other specified noninflammatory disorders of vagina: Secondary | ICD-10-CM | POA: Diagnosis not present

## 2017-06-30 DIAGNOSIS — N760 Acute vaginitis: Secondary | ICD-10-CM

## 2017-06-30 DIAGNOSIS — B9689 Other specified bacterial agents as the cause of diseases classified elsewhere: Secondary | ICD-10-CM

## 2017-06-30 DIAGNOSIS — Z3A13 13 weeks gestation of pregnancy: Secondary | ICD-10-CM | POA: Diagnosis not present

## 2017-06-30 DIAGNOSIS — O10011 Pre-existing essential hypertension complicating pregnancy, first trimester: Secondary | ICD-10-CM | POA: Insufficient documentation

## 2017-06-30 DIAGNOSIS — O3461 Maternal care for abnormality of vagina, first trimester: Secondary | ICD-10-CM | POA: Insufficient documentation

## 2017-06-30 DIAGNOSIS — O99331 Smoking (tobacco) complicating pregnancy, first trimester: Secondary | ICD-10-CM | POA: Diagnosis not present

## 2017-06-30 LAB — COMPREHENSIVE METABOLIC PANEL
ALBUMIN: 3.4 g/dL — AB (ref 3.5–5.0)
ALK PHOS: 58 U/L (ref 38–126)
ALT: 26 U/L (ref 14–54)
ANION GAP: 9 (ref 5–15)
AST: 20 U/L (ref 15–41)
BILIRUBIN TOTAL: 0.1 mg/dL — AB (ref 0.3–1.2)
BUN: 8 mg/dL (ref 6–20)
CALCIUM: 8.8 mg/dL — AB (ref 8.9–10.3)
CO2: 26 mmol/L (ref 22–32)
Chloride: 103 mmol/L (ref 101–111)
Creatinine, Ser: 0.64 mg/dL (ref 0.44–1.00)
GFR calc non Af Amer: >60 mL/min (ref >60)
Glucose, Bld: 68 mg/dL (ref 65–99)
POTASSIUM: 4.4 mmol/L (ref 3.5–5.1)
SODIUM: 138 mmol/L (ref 135–145)
TOTAL PROTEIN: 7 g/dL (ref 6.5–8.1)

## 2017-06-30 LAB — CBC
HEMATOCRIT: 36.4 % (ref 35.0–47.0)
HEMOGLOBIN: 12.4 g/dL (ref 12.0–16.0)
MCH: 27.9 pg (ref 26.0–34.0)
MCHC: 34 g/dL (ref 32.0–36.0)
MCV: 81.9 fL (ref 80.0–100.0)
Platelets: 241 10*3/uL (ref 150–440)
RBC: 4.45 MIL/uL (ref 3.80–5.20)
RDW: 15 % — AB (ref 11.5–14.5)
WBC: 7.8 10*3/uL (ref 3.6–11.0)

## 2017-06-30 LAB — URINALYSIS, COMPLETE (UACMP) WITH MICROSCOPIC
BILIRUBIN URINE: NEGATIVE
GLUCOSE, UA: NEGATIVE mg/dL
HGB URINE DIPSTICK: NEGATIVE
Ketones, ur: NEGATIVE mg/dL
LEUKOCYTES UA: NEGATIVE
NITRITE: NEGATIVE
PH: 5 (ref 5.0–8.0)
Protein, ur: NEGATIVE mg/dL
SPECIFIC GRAVITY, URINE: 1.027 (ref 1.005–1.030)

## 2017-06-30 LAB — WET PREP, GENITAL
Sperm: NONE SEEN
TRICH WET PREP: NONE SEEN
Yeast Wet Prep HPF POC: NONE SEEN

## 2017-06-30 LAB — CHLAMYDIA/NGC RT PCR (ARMC ONLY)
Chlamydia Tr: NOT DETECTED
N GONORRHOEAE: NOT DETECTED

## 2017-06-30 LAB — HCG, QUANTITATIVE, PREGNANCY: hCG, Beta Chain, Quant, S: 168975 m[IU]/mL — ABNORMAL HIGH (ref <5)

## 2017-06-30 LAB — ABO/RH: ABO/RH(D): O POS

## 2017-06-30 MED ORDER — METRONIDAZOLE 500 MG PO TABS: 500.0000 mg | ORAL_TABLET | Freq: Two times a day (BID) | ORAL | 0 refills | Status: DC

## 2017-06-30 MED ORDER — ACETAMINOPHEN 325 MG PO TABS
650.0000 mg | ORAL_TABLET | Freq: Once | ORAL | Status: AC
  Administered 2017-06-30: 650 mg via ORAL

## 2017-06-30 MED ORDER — ACETAMINOPHEN 325 MG PO TABS
ORAL_TABLET | ORAL | Status: AC
  Filled 2017-06-30: qty 2

## 2017-06-30 NOTE — Discharge Instructions (Addendum)
If you have significant back pain, numbness, weakness, significant abdominal pain, fever, chills, vaginal bleeding that is more than a pad an hour or you feel worse in any way return to the emergency department.

## 2017-06-30 NOTE — ED Provider Notes (Addendum)
[facility] Emergency Department Provider Note  ____________________________________________   I have reviewed the triage vital signs and the nursing notes. Where available I have reviewed prior notes and, if possible and indicated, outside hospital notes.    HISTORY  Chief Complaint Vaginal Discharge    HPI [name] is a 22 y.o. female  with a known IUP, at 52 and 4 by dates, presents today complaining of having passed what she thinks was a mucous plug. She had 2 episodes of passing scant yellow "mucousy" discharge with no bleeding. Patient is known to be Rh+ she has not had any significant cramping although she has chronic back pain, she is worried that her back pain may be related to this. She denies any fever or chills, she feels the same today as she did yesterday hours except for the fact that she had passing of this mucousy like substance from her vagina. She denies STI exposure in his low suspicion that she hasn't a CI. She has had no gush of fluid.no numbness no weakness no dysuria does not believe she passed urine.      Past Medical History:  Diagnosis Date  . Abdominal pain   . Asthma   . History of hypertension   . Hypertension   . Nausea   . Peptic ulcer disease   . Pre-diabetes (history)     Patient Active Problem List   Diagnosis Date Noted  . Increased BMI 05/11/2017  . Drug use 05/11/2017  . Unsure of LMP (last menstrual period) as reason for ultrasound scan 05/11/2017  . Tobacco user 01/11/2017  . History of C-section 11/29/2016  . Rubella non-immune status, antepartum 04/19/2016  . Nausea/vomiting in pregnancy   . Acquired acanthosis nigricans 11/11/2010  . Depressive disorder 07/19/2010  . Obesity (BMI 30.0-34.9) 07/19/2010  . Extrinsic asthma 04/03/2009    Past Surgical History:  Procedure Laterality Date  . CESAREAN SECTION N/A 11/29/2016   Procedure: CESAREAN SECTION;  Surgeon: Herold Harms, MD;   Location: [facility] ORS;  Service: Obstetrics;  Laterality: N/A;  . TONSILLECTOMY    . WISDOM TOOTH EXTRACTION      Prior to Admission medications   Medication Sig Start Date End Date Taking? Authorizing Provider  nitrofurantoin, macrocrystal-monohydrate, (MACROBID) 100 MG capsule Take 1 capsule (100 mg total) by mouth 2 (two) times daily. 06/24/17   Fisher, Roselyn Bering, PA-C  ondansetron (ZOFRAN) 4 MG tablet TAKE 1 TABLET BY MOUTH EVERY 8 HOURS AS NEEDED FOR NAUSEA AND VOMITING 06/29/17   Hildred Laser, MD  Prenatal Vit-Fe Fumarate-FA (MULTIVITAMIN-PRENATAL) 27-0.8 MG TABS tablet Take 1 tablet by mouth daily at 12 noon.    [provider]    Allergies Metformin and related and Sprintec 28 [norgestimate-eth estradiol]  Family History  Problem Relation Age of Onset  . Cholelithiasis Mother   . SIDS Brother   . Cholelithiasis Maternal Grandmother   . Ulcers Maternal Grandmother   . Diabetes Maternal Grandfather   . Celiac disease Neg Hx     Social History Social History   Tobacco Use  . Smoking status: Current Every Day Smoker    Packs/day: 0.25    Types: Cigarettes    Last attempt to quit: 03/24/2016    Years since quitting: 1.2  . Smokeless tobacco: Never Used  Substance Use Topics  . Alcohol use: No  . Drug use: Yes    Types: Marijuana    Comment: every day    Review of Systems Constitutional: No fever/chills  Eyes: No visual changes. ENT: No sore throat. No stiff neck no neck pain Cardiovascular: Denies chest pain. Respiratory: Denies shortness of breath. Gastrointestinal:   no vomiting.  No diarrhea.  No constipation. Genitourinary: Negative for dysuria. Musculoskeletal: Negative lower extremity swelling Skin: Negative for rash. Neurological: Negative for severe headaches, focal weakness or numbness.   ____________________________________________   PHYSICAL EXAM:  VITAL SIGNS: ED Triage Vitals  Enc Vitals Group     BP 06/30/17 1919 (!) 142/89     Pulse  Rate 06/30/17 1919 (!) 104     Resp 06/30/17 1919 20     Temp 06/30/17 1919 98.5 F (36.9 C)     Temp Source 06/30/17 1919 Oral     SpO2 06/30/17 1919 100 %     Weight 06/30/17 1920 156 lb (70.8 kg)     Height 06/30/17 1920  (1.499 m)     Head Circumference --      Peak Flow --      Pain Score 06/30/17 1920 8     Pain Loc --      Pain Edu? --      Excl. in GC? --     Constitutional: Alert and oriented. Well appearing and in no acute distress. Eyes: Conjunctivae are normal Head: Atraumatic HEENT: No congestion/rhinnorhea. Mucous membranes are moist.  Oropharynx non-erythematous Neck:   Nontender with no meningismus, no masses, no stridor Cardiovascular: Normal rate, regular rhythm. Grossly normal heart sounds.  Good peripheral circulation. Respiratory: Normal respiratory effort.  No retractions. Lungs CTAB. Abdominal: Soft and nontender. No distention. No guarding no rebound Back:  There is no focal tenderness or step off.  there is no midline tenderness there are no lesions noted. there is no CVA tenderness  Pelvic exam: Female nurse chaperone present, no external lesions noted, there is a white mucousy vaginal discharge noted with no purulent discharge, no cervical motion tenderness, no adnexal tenderness or mass, there is no significant uterine tenderness or mass. No vaginal bleeding  Musculoskeletal: No lower extremity tenderness, no upper extremity tenderness. No joint effusions, no DVT signs strong distal pulses no edema Neurologic:  Normal speech and language. No gross focal neurologic deficits are appreciated.  Skin:  Skin is warm, dry and intact. No rash noted. Psychiatric: Mood and affect are normal. Speech and behavior are normal.  ____________________________________________   LABS (all labs ordered are listed, but only abnormal results are displayed)  Labs Reviewed  CBC - Abnormal; Notable for the following components:      Result Value   RDW 15.0 (*)     All other components within normal limits  COMPREHENSIVE METABOLIC PANEL - Abnormal; Notable for the following components:   Calcium 8.8 (*)    Albumin 3.4 (*)    Total Bilirubin 0.1 (*)    All other components within normal limits  HCG, QUANTITATIVE, PREGNANCY - Abnormal; Notable for the following components:   hCG, Beta Chain, Quant, [place] 161,096 (*)    All other components within normal limits  URINALYSIS, COMPLETE (UACMP) WITH MICROSCOPIC - Abnormal; Notable for the following components:   Color, Urine YELLOW (*)    APPearance HAZY (*)    Bacteria, UA FEW (*)    All other components within normal limits  CHLAMYDIA/NGC RT PCR ([facility] ONLY)  WET PREP, GENITAL  ABO/RH    Pertinent labs  results that were available during my care of the patient were reviewed by me and considered in my medical decision making (see chart  for details). ____________________________________________  EKG  I personally interpreted any EKGs ordered by me or triage  ____________________________________________  RADIOLOGY  Pertinent labs & imaging results that were available during my care of the patient were reviewed by me and considered in my medical decision making (see chart for details). If possible, patient and/or family made aware of any abnormal findings.  Personally looked at [place]. Discussed with reading radiologist who reports to me it is normal in all regards. Definitive read pending.   No results found. ____________________________________________    PROCEDURES  Procedure(s) performed: None  Procedures  Critical Care performed: None  ____________________________________________   INITIAL IMPRESSION / ASSESSMENT AND PLAN / ED COURSE  Pertinent labs & imaging results that were available during my care of the patient were reviewed by me and considered in my medical decision making (see chart for details).  patient here with a known IUP early pregnancy with concerns for  incipient miscarriage, we will obtain ultrasound to  evaluate the status of the pregnancy, it is not going to be an ectopic given that we know it is  an intrauterine pregnancy. does not appear to be consistent with PID on exam.os  does not appear to be open at this time.  ----------------------------------------- 11:17 PM on 06/30/2017 -----------------------------------------   pt in nad. No sign of cauda equina syndrome or significant back pain. Neuro intact.  pregnaacy w/u is reassuring.   Discussed w/ dr. Logan Bores who knows her. He recommends 500 bid x7  Of flagyl and close outpt f/u.   Pt reassured.   abd benign.   Will d.c.     ____________________________________________   FINAL CLINICAL IMPRESSION(S) / ED DIAGNOSES  Final diagnoses:  None      This chart was dictated using voice recognition software.  Despite best efforts to proofread,  errors can occur which can change meaning.      Jeanmarie Plant, MD 06/30/17 2114    Jeanmarie Plant, MD 06/30/17 2127    Jeanmarie Plant, MD 06/30/17 2318

## 2017-06-30 NOTE — Telephone Encounter (Signed)
Ob 13 2/7 states she went to the bathroom while at work and saw a yellow, stringy, and thick mucous. NO odor. NO uti sx. Constipated her normal. Pos for mild pp and a tender UT. Pt was seen in ed on 4/26 dx with uti. Given Macrobid but did not complete. Advised pt to complete Macrobid. Push fluids.  Tylenol prn. If sx persist after completion of ATB to make an appt to be seen.

## 2017-06-30 NOTE — ED Triage Notes (Addendum)
Pt is [redacted] weeks pregnant and states "my mucous plug came out". Called pmd and was told it was probably vaginal discharge. Pt states also having lower back pain. Pt has no vaginal bleeding, or urinary frequency.

## 2017-06-30 NOTE — ED Notes (Signed)
Pt states she is [redacted] wks pregnant and reports having think mucous discharge earlier today that she thinks is her mucous plug. Pt c/o back pains intermittently since the discharge.

## 2017-06-30 NOTE — Telephone Encounter (Signed)
The patient called and stated that she believes she may have lost her mucus plug, and that she is also experiencing sharp pain on the side of her abdomin, The patient would like to speak with a nurse as soon as possible. Please advise.

## 2017-06-30 NOTE — ED Notes (Signed)
Pelvic cart at bedside. 

## 2017-07-05 ENCOUNTER — Encounter: Payer: Self-pay | Admitting: Obstetrics and Gynecology

## 2017-07-05 ENCOUNTER — Ambulatory Visit (INDEPENDENT_AMBULATORY_CARE_PROVIDER_SITE_OTHER): Payer: Medicaid Other | Admitting: Obstetrics and Gynecology

## 2017-07-05 VITALS — BP 111/75 | HR 91 | Wt 153.7 lb

## 2017-07-05 DIAGNOSIS — Z3482 Encounter for supervision of other normal pregnancy, second trimester: Secondary | ICD-10-CM | POA: Diagnosis not present

## 2017-07-05 DIAGNOSIS — N760 Acute vaginitis: Secondary | ICD-10-CM | POA: Diagnosis not present

## 2017-07-05 DIAGNOSIS — B9689 Other specified bacterial agents as the cause of diseases classified elsewhere: Secondary | ICD-10-CM

## 2017-07-05 LAB — POCT URINALYSIS DIPSTICK
Bilirubin, UA: NEGATIVE
Blood, UA: NEGATIVE
GLUCOSE UA: NEGATIVE
Ketones, UA: NEGATIVE
LEUKOCYTES UA: NEGATIVE
Nitrite, UA: NEGATIVE
Protein, UA: NEGATIVE
Spec Grav, UA: <=1.005 — AB (ref 1.010–1.025)
Urobilinogen, UA: 0.2 E.U./dL
pH, UA: 7.5 (ref 5.0–8.0)

## 2017-07-05 NOTE — Progress Notes (Signed)
ROB-pt stated that she went to ED due to seeing something like her mucus plug. ED stated that it wasn't she is follow up after that visit.

## 2017-07-05 NOTE — Progress Notes (Signed)
ROB: Patient here for ED follow-up.  Reports she was diagnosed with BV-currently taking Flagyl.  Reports no issues at this time.  Her back pain and vaginal discharge has resolved. I spent 15 minutes with this patient of which greater than 50% was spent discussing her emergency department visit, bacterial vaginosis treatment and future follow-ups, MaterniT 21 testing results.

## 2017-07-12 ENCOUNTER — Encounter: Payer: Medicaid Other | Admitting: Obstetrics and Gynecology

## 2017-07-19 ENCOUNTER — Ambulatory Visit (INDEPENDENT_AMBULATORY_CARE_PROVIDER_SITE_OTHER): Payer: Medicaid Other | Admitting: Obstetrics and Gynecology

## 2017-07-19 VITALS — BP 107/70 | HR 101 | Wt 152.8 lb

## 2017-07-19 DIAGNOSIS — M5431 Sciatica, right side: Secondary | ICD-10-CM

## 2017-07-19 DIAGNOSIS — O2612 Low weight gain in pregnancy, second trimester: Secondary | ICD-10-CM

## 2017-07-19 DIAGNOSIS — Z3482 Encounter for supervision of other normal pregnancy, second trimester: Secondary | ICD-10-CM

## 2017-07-19 LAB — POCT URINALYSIS DIPSTICK
Bilirubin, UA: NEGATIVE
Glucose, UA: NEGATIVE
Ketones, UA: NEGATIVE
LEUKOCYTES UA: NEGATIVE
Nitrite, UA: NEGATIVE
Protein, UA: NEGATIVE
RBC UA: NEGATIVE
Urobilinogen, UA: 0.2 E.U./dL
pH, UA: 6 (ref 5.0–8.0)

## 2017-07-19 NOTE — Progress Notes (Signed)
ROB: Notes occasional spotting after intercourse but no concerns. Given bleeding precautions. C/o sciatic pain on right, discussed management options. Concerned about weight gain in pregnancy (has lost 8 lbs since onset of pregnancy).  Would like to begin whole milk, but needs prescription for [facility]. Will provide. RTC in 4 weeks, for anatomy scan at that time.

## 2017-07-19 NOTE — Progress Notes (Signed)
ROB-pt stated that she has noticed some spotting blood after intercourse but no other times. Pt stated that she is having nerve pain due to the baby laying on the nerve all the time.

## 2017-07-19 NOTE — Patient Instructions (Signed)
Sciatica Sciatica is pain, numbness, weakness, or tingling along the path of the sciatic nerve. The sciatic nerve starts in the lower back and runs down the back of each leg. The nerve controls the muscles in the lower leg and in the back of the knee. It also provides feeling (sensation) to the back of the thigh, the lower leg, and the sole of the foot. Sciatica is a symptom of another medical condition that pinches or puts pressure on the sciatic nerve. Generally, sciatica only affects one side of the body. Sciatica usually goes away on its own or with treatment. In some cases, sciatica may keep coming back (recur). What are the causes? This condition is caused by pressure on the sciatic nerve, or pinching of the sciatic nerve. This may be the result of:  A disk in between the bones of the spine (vertebrae) bulging out too far (herniated disk).  Age-related changes in the spinal disks (degenerative disk disease).  A pain disorder that affects a muscle in the buttock (piriformis syndrome).  Extra bone growth (bone spur) near the sciatic nerve.  An injury or break (fracture) of the pelvis.  Pregnancy.  Tumor (rare).  What increases the risk? The following factors may make you more likely to develop this condition:  Playing sports that place pressure or stress on the spine, such as football or weight lifting.  Having poor strength and flexibility.  A history of back injury.  A history of back surgery.  Sitting for long periods of time.  Doing activities that involve repetitive bending or lifting.  Obesity.  What are the signs or symptoms? Symptoms can vary from mild to very severe, and they may include:  Any of these problems in the lower back, leg, hip, or buttock: ? Mild tingling or dull aches. ? Burning sensations. ? Sharp pains.  Numbness in the back of the calf or the sole of the foot.  Leg weakness.  Severe back pain that makes movement difficult.  These  symptoms may get worse when you cough, sneeze, or laugh, or when you sit or stand for long periods of time. Being overweight may also make symptoms worse. In some cases, symptoms may recur over time. How is this diagnosed? This condition may be diagnosed based on:  Your symptoms.  A physical exam. Your health care provider may ask you to do certain movements to check whether those movements trigger your symptoms.  You may have tests, including: ? Blood tests. ? X-rays. ? MRI. ? CT scan.  How is this treated? In many cases, this condition improves on its own, without any treatment. However, treatment may include:  Reducing or modifying physical activity during periods of pain.  Exercising and stretching to strengthen your abdomen and improve the flexibility of your spine.  Icing and applying heat to the affected area.  Medicines that help: ? To relieve pain and swelling. ? To relax your muscles.  Injections of medicines that help to relieve pain, irritation, and inflammation around the sciatic nerve (steroids).  Surgery.  Follow these instructions at home: Medicines  Take over-the-counter and prescription medicines only as told by your health care provider.  Do not drive or operate heavy machinery while taking prescription pain medicine. Managing pain  If directed, apply ice to the affected area. ? Put ice in a plastic bag. ? Place a towel between your skin and the bag. ? Leave the ice on for 20 minutes, 2-3 times a day.  After icing, apply   heat to the affected area before you exercise or as often as told by your health care provider. Use the heat source that your health care provider recommends, such as a moist heat pack or a heating pad. ? Place a towel between your skin and the heat source. ? Leave the heat on for 20-30 minutes. ? Remove the heat if your skin turns bright red. This is especially important if you are unable to feel pain, heat, or cold. You may have a  greater risk of getting burned. Activity  Return to your normal activities as told by your health care provider. Ask your health care provider what activities are safe for you. ? Avoid activities that make your symptoms worse.  Take brief periods of rest throughout the day. Resting in a lying or standing position is usually better than sitting to rest. ? When you rest for longer periods, mix in some mild activity or stretching between periods of rest. This will help to prevent stiffness and pain. ? Avoid sitting for long periods of time without moving. Get up and move around at least one time each hour.  Exercise and stretch regularly, as told by your health care provider.  Do not lift anything that is heavier than 10 lb (4.5 kg) while you have symptoms of sciatica. When you do not have symptoms, you should still avoid heavy lifting, especially repetitive heavy lifting.  When you lift objects, always use proper lifting technique, which includes: ? Bending your knees. ? Keeping the load close to your body. ? Avoiding twisting. General instructions  Use good posture. ? Avoid leaning forward while sitting. ? Avoid hunching over while standing.  Maintain a healthy weight. Excess weight puts extra stress on your back and makes it difficult to maintain good posture.  Wear supportive, comfortable shoes. Avoid wearing high heels.  Avoid sleeping on a mattress that is too soft or too hard. A mattress that is firm enough to support your back when you sleep may help to reduce your pain.  Keep all follow-up visits as told by your health care provider. This is important. Contact a health care provider if:  You have pain that wakes you up when you are sleeping.  You have pain that gets worse when you lie down.  Your pain is worse than you have experienced in the past.  Your pain lasts longer than 4 weeks.  You experience unexplained weight loss. Get help right away if:  You lose control  of your bowel or bladder (incontinence).  You have: ? Weakness in your lower back, pelvis, buttocks, or legs that gets worse. ? Redness or swelling of your back. ? A burning sensation when you urinate. This information is not intended to replace advice given to you by your health care provider. Make sure you discuss any questions you have with your health care provider. Document Released: 02/09/2001 Document Revised: 07/22/2015 Document Reviewed: 10/25/2014 Elsevier Interactive Patient Education  2018 [employer]  

## 2017-08-01 ENCOUNTER — Telehealth: Payer: Self-pay | Admitting: Obstetrics and Gynecology

## 2017-08-01 NOTE — Telephone Encounter (Signed)
Pt states she has had heartburn since yesterday. She has tried tums and omeprazole with no relief. Advised zantac bid. Avoid greasy, fried, or spicy foods. Small frequent meals. Push fluids. Pt voices understanding.

## 2017-08-01 NOTE — Telephone Encounter (Signed)
The patient called and stated that she is experiencing extreme discomfort with her heartburn, She would like to speak with a nurse if possible. Please advise.

## 2017-08-04 ENCOUNTER — Encounter: Payer: Self-pay | Admitting: Obstetrics and Gynecology

## 2017-08-04 ENCOUNTER — Other Ambulatory Visit: Payer: Self-pay

## 2017-08-04 MED ORDER — ONDANSETRON HCL 4 MG PO TABS: ORAL_TABLET | ORAL | 2 refills | Status: DC

## 2017-08-16 ENCOUNTER — Other Ambulatory Visit: Payer: Medicaid Other

## 2017-08-16 ENCOUNTER — Encounter: Payer: Medicaid Other | Admitting: Obstetrics and Gynecology

## 2017-08-17 ENCOUNTER — Ambulatory Visit (INDEPENDENT_AMBULATORY_CARE_PROVIDER_SITE_OTHER): Payer: Medicaid Other

## 2017-08-17 ENCOUNTER — Encounter: Payer: Self-pay | Admitting: Obstetrics and Gynecology

## 2017-08-17 ENCOUNTER — Ambulatory Visit (INDEPENDENT_AMBULATORY_CARE_PROVIDER_SITE_OTHER): Payer: Medicaid Other | Admitting: Obstetrics and Gynecology

## 2017-08-17 VITALS — BP 97/66 | HR 94 | Wt 152.4 lb

## 2017-08-17 DIAGNOSIS — Z3482 Encounter for supervision of other normal pregnancy, second trimester: Secondary | ICD-10-CM | POA: Diagnosis not present

## 2017-08-17 DIAGNOSIS — O4692 Antepartum hemorrhage, unspecified, second trimester: Secondary | ICD-10-CM

## 2017-08-17 LAB — POCT URINALYSIS DIPSTICK
GLUCOSE UA: NEGATIVE
Nitrite, UA: NEGATIVE
Odor: NEGATIVE
Protein, UA: POSITIVE — AB
RBC UA: NEGATIVE
SPEC GRAV UA: 1.02 (ref 1.010–1.025)
Urobilinogen, UA: 0.2 E.U./dL
pH, UA: 6 (ref 5.0–8.0)

## 2017-08-17 NOTE — Progress Notes (Signed)
ROB: Patient complained of some bleeding the other evening after intercourse.  She says that "rough sex caused her to tear".  She reports no further bleeding.  Has used ice packs for labial swelling.  Does not want to be examined today. Fetal anatomic survey ultrasound today.  Patient has had some itching and she would like to try Monistat-I approved of this.  Recommended wet prep if symptoms no better.

## 2017-08-17 NOTE — Progress Notes (Signed)
Rob- and anatomy. C/o of back pain- lower.

## 2017-09-10 ENCOUNTER — Other Ambulatory Visit: Payer: Self-pay

## 2017-09-10 DIAGNOSIS — M545 Low back pain: Secondary | ICD-10-CM | POA: Diagnosis present

## 2017-09-10 DIAGNOSIS — Z5321 Procedure and treatment not carried out due to patient leaving prior to being seen by health care provider: Secondary | ICD-10-CM | POA: Insufficient documentation

## 2017-09-10 NOTE — ED Triage Notes (Signed)
Patient reports left lower back pain after stepping down "hard" from porch.  Patient also reports history of chronic back pain.  Patient is [redacted] weeks pregnant.

## 2017-09-11 ENCOUNTER — Emergency Department
Admission: EM | Admit: 2017-09-11 | Discharge: 2017-09-11 | Disposition: A | Discharge status: Home / Self Care | Payer: Medicaid Other | Attending: Emergency Medicine | Admitting: Emergency Medicine

## 2017-09-11 NOTE — ED Notes (Signed)
Pt no longer in waiting room 

## 2017-09-11 NOTE — ED Notes (Signed)
Rounding in waiting room; pt is not present

## 2017-09-14 ENCOUNTER — Other Ambulatory Visit: Payer: Self-pay

## 2017-09-14 ENCOUNTER — Ambulatory Visit (INDEPENDENT_AMBULATORY_CARE_PROVIDER_SITE_OTHER): Payer: Medicaid Other | Admitting: Obstetrics and Gynecology

## 2017-09-14 ENCOUNTER — Encounter: Payer: Self-pay | Admitting: Obstetrics and Gynecology

## 2017-09-14 VITALS — BP 95/67 | HR 92 | Wt 153.0 lb

## 2017-09-14 DIAGNOSIS — O34219 Maternal care for unspecified type scar from previous cesarean delivery: Secondary | ICD-10-CM

## 2017-09-14 DIAGNOSIS — Z3A24 24 weeks gestation of pregnancy: Secondary | ICD-10-CM

## 2017-09-14 DIAGNOSIS — M549 Dorsalgia, unspecified: Secondary | ICD-10-CM

## 2017-09-14 DIAGNOSIS — K219 Gastro-esophageal reflux disease without esophagitis: Secondary | ICD-10-CM

## 2017-09-14 DIAGNOSIS — Z131 Encounter for screening for diabetes mellitus: Secondary | ICD-10-CM

## 2017-09-14 DIAGNOSIS — O99612 Diseases of the digestive system complicating pregnancy, second trimester: Secondary | ICD-10-CM

## 2017-09-14 DIAGNOSIS — F5089 Other specified eating disorder: Secondary | ICD-10-CM

## 2017-09-14 DIAGNOSIS — Z130 Encounter for screening for diseases of the blood and blood-forming organs and certain disorders involving the immune mechanism: Secondary | ICD-10-CM

## 2017-09-14 DIAGNOSIS — O99619 Diseases of the digestive system complicating pregnancy, unspecified trimester: Secondary | ICD-10-CM

## 2017-09-14 DIAGNOSIS — O26812 Pregnancy related exhaustion and fatigue, second trimester: Secondary | ICD-10-CM

## 2017-09-14 DIAGNOSIS — Z3482 Encounter for supervision of other normal pregnancy, second trimester: Secondary | ICD-10-CM

## 2017-09-14 DIAGNOSIS — O9989 Other specified diseases and conditions complicating pregnancy, childbirth and the puerperium: Secondary | ICD-10-CM

## 2017-09-14 LAB — POCT URINALYSIS DIPSTICK
BILIRUBIN UA: NEGATIVE
Glucose, UA: NEGATIVE
Leukocytes, UA: NEGATIVE
NITRITE UA: NEGATIVE
PH UA: 6 (ref 5.0–8.0)
Protein, UA: POSITIVE — AB
RBC UA: NEGATIVE
SPEC GRAV UA: 1.025 (ref 1.010–1.025)
UROBILINOGEN UA: 0.2 U/dL

## 2017-09-14 MED ORDER — ACETAMINOPHEN-CODEINE #3 300-30 MG PO TABS: 1.0000 | ORAL_TABLET | Freq: Four times a day (QID) | ORAL | 0 refills | Status: DC | PRN

## 2017-09-14 MED ORDER — PANTOPRAZOLE SODIUM 20 MG PO TBEC: 20.0000 mg | DELAYED_RELEASE_TABLET | Freq: Two times a day (BID) | ORAL | 3 refills | Status: DC

## 2017-09-14 MED ORDER — ONDANSETRON HCL 4 MG PO TABS: ORAL_TABLET | ORAL | 2 refills | Status: DC

## 2017-09-14 NOTE — Progress Notes (Signed)
ROB: Patient notes low back pain, chronic (even prior to pregnancy but pregnancy has made it worse). Has tried baths, stretches, pregnancy massages, ES Tylenol (up to 1000 mg). Pain even worse at night. Lower back and radiates to leg.  Is also interfering with work. Will prescribe short supply of T#3 and will get pregnancy girdle.  Desires iron check as she has been craving ice, and noting fatigue. Will check CBC. RTC in 4 weeks, will need 28 week labs at that time.

## 2017-09-14 NOTE — Progress Notes (Signed)
ROB-pt stated having a lot of back pain. Went to ED due to the pain but didn't stay due to the long wait.

## 2017-10-12 ENCOUNTER — Other Ambulatory Visit: Payer: Medicaid Other

## 2017-10-12 ENCOUNTER — Ambulatory Visit (INDEPENDENT_AMBULATORY_CARE_PROVIDER_SITE_OTHER): Payer: Medicaid Other | Admitting: Obstetrics and Gynecology

## 2017-10-12 ENCOUNTER — Encounter: Payer: Self-pay | Admitting: Obstetrics and Gynecology

## 2017-10-12 VITALS — BP 110/77 | HR 114 | Wt 155.0 lb

## 2017-10-12 DIAGNOSIS — Z130 Encounter for screening for diseases of the blood and blood-forming organs and certain disorders involving the immune mechanism: Secondary | ICD-10-CM

## 2017-10-12 DIAGNOSIS — Z23 Encounter for immunization: Secondary | ICD-10-CM

## 2017-10-12 DIAGNOSIS — Z3A28 28 weeks gestation of pregnancy: Secondary | ICD-10-CM | POA: Diagnosis not present

## 2017-10-12 DIAGNOSIS — Z3482 Encounter for supervision of other normal pregnancy, second trimester: Secondary | ICD-10-CM

## 2017-10-12 DIAGNOSIS — Z131 Encounter for screening for diabetes mellitus: Secondary | ICD-10-CM

## 2017-10-12 LAB — POCT URINALYSIS DIPSTICK
BILIRUBIN UA: NEGATIVE
Blood, UA: NEGATIVE
Glucose, UA: NEGATIVE
Ketones, UA: NEGATIVE
LEUKOCYTES UA: NEGATIVE
Nitrite, UA: NEGATIVE
Protein, UA: POSITIVE — AB
Spec Grav, UA: 1.025 (ref 1.010–1.025)
Urobilinogen, UA: 0.2 E.U./dL
pH, UA: 6 (ref 5.0–8.0)

## 2017-10-12 MED ORDER — TETANUS-DIPHTH-ACELL PERTUSSIS 5-2.5-18.5 LF-MCG/0.5 IM SUSP
0.5000 mL | Freq: Once | INTRAMUSCULAR | Status: AC
  Administered 2017-10-12: 0.5 mL via INTRAMUSCULAR

## 2017-10-12 NOTE — Progress Notes (Signed)
ROB: Patient with general A&P of pregnancy no specific problems.  Says she is just "over it".  1 hour GCT today.  Patient has occasional "Deberah PeltonBraxton Hicks" by her description.

## 2017-10-12 NOTE — Addendum Note (Signed)
Addended by: Haskel KhanSMITH, Deniqua Perry M on: 10/12/2017 09:56 AM   Modules accepted: Orders

## 2017-10-12 NOTE — Progress Notes (Signed)
Pt presents today for 28 week labs and check up. Pt states she has no concerns at this time.

## 2017-10-13 LAB — CBC
Hematocrit: 30.3 % — ABNORMAL LOW (ref 34.0–46.6)
Hemoglobin: 10 g/dL — ABNORMAL LOW (ref 11.1–15.9)
MCH: 26.9 pg (ref 26.6–33.0)
MCHC: 33 g/dL (ref 31.5–35.7)
MCV: 82 fL (ref 79–97)
Platelets: 285 10*3/uL (ref 150–450)
RBC: 3.72 x10E6/uL — ABNORMAL LOW (ref 3.77–5.28)
RDW: 14.5 % (ref 12.3–15.4)
WBC: 8.6 10*3/uL (ref 3.4–10.8)

## 2017-10-13 LAB — RPR: RPR: NONREACTIVE

## 2017-10-13 LAB — GLUCOSE, 1 HOUR GESTATIONAL: Gestational Diabetes Screen: 108 mg/dL (ref 65–139)

## 2017-10-29 DIAGNOSIS — O99013 Anemia complicating pregnancy, third trimester: Secondary | ICD-10-CM | POA: Insufficient documentation

## 2017-11-01 ENCOUNTER — Ambulatory Visit (INDEPENDENT_AMBULATORY_CARE_PROVIDER_SITE_OTHER): Payer: Medicaid Other | Admitting: Obstetrics and Gynecology

## 2017-11-01 VITALS — BP 91/54 | HR 91 | Wt 158.1 lb

## 2017-11-01 DIAGNOSIS — O34219 Maternal care for unspecified type scar from previous cesarean delivery: Secondary | ICD-10-CM

## 2017-11-01 DIAGNOSIS — N858 Other specified noninflammatory disorders of uterus: Secondary | ICD-10-CM

## 2017-11-01 DIAGNOSIS — Z98891 History of uterine scar from previous surgery: Secondary | ICD-10-CM

## 2017-11-01 DIAGNOSIS — N859 Noninflammatory disorder of uterus, unspecified: Secondary | ICD-10-CM

## 2017-11-01 DIAGNOSIS — Z3483 Encounter for supervision of other normal pregnancy, third trimester: Secondary | ICD-10-CM

## 2017-11-01 LAB — POCT URINALYSIS DIPSTICK OB
BILIRUBIN UA: NEGATIVE
Blood, UA: NEGATIVE
GLUCOSE, UA: NEGATIVE
Ketones, UA: NEGATIVE
NITRITE UA: NEGATIVE
PH UA: 6.5 (ref 5.0–8.0)
Spec Grav, UA: 1.02 (ref 1.010–1.025)
UROBILINOGEN UA: 0.2 U/dL

## 2017-11-01 NOTE — Progress Notes (Signed)
Problem OB visit: Patient notes complaints of contractions since Saturday, every 2-5 minutes, moderately painful but bearable.  Cervix with funneling, but internal os closed. Discussed adequate hydration (notes she drinks maybe 1 bottle of water a day), and less caffeinated beverages. RTC for regularly scheduled visit.

## 2017-11-01 NOTE — Progress Notes (Signed)
ROB- PT stated that she was having contractions every 2-5 mins all day on Saturday -Monday.

## 2017-11-07 ENCOUNTER — Ambulatory Visit (INDEPENDENT_AMBULATORY_CARE_PROVIDER_SITE_OTHER): Payer: Medicaid Other | Admitting: Obstetrics and Gynecology

## 2017-11-07 ENCOUNTER — Other Ambulatory Visit: Payer: Self-pay

## 2017-11-07 ENCOUNTER — Encounter: Payer: Self-pay | Admitting: Obstetrics and Gynecology

## 2017-11-07 VITALS — BP 105/65 | HR 99 | Wt 157.7 lb

## 2017-11-07 DIAGNOSIS — Z3483 Encounter for supervision of other normal pregnancy, third trimester: Secondary | ICD-10-CM

## 2017-11-07 DIAGNOSIS — B9689 Other specified bacterial agents as the cause of diseases classified elsewhere: Secondary | ICD-10-CM

## 2017-11-07 DIAGNOSIS — Z98891 History of uterine scar from previous surgery: Secondary | ICD-10-CM

## 2017-11-07 DIAGNOSIS — O34219 Maternal care for unspecified type scar from previous cesarean delivery: Secondary | ICD-10-CM

## 2017-11-07 DIAGNOSIS — N760 Acute vaginitis: Secondary | ICD-10-CM

## 2017-11-07 DIAGNOSIS — O479 False labor, unspecified: Secondary | ICD-10-CM

## 2017-11-07 LAB — POCT URINALYSIS DIPSTICK OB
Bilirubin, UA: NEGATIVE
Blood, UA: NEGATIVE
Glucose, UA: NEGATIVE
KETONES UA: NEGATIVE
NITRITE UA: NEGATIVE
SPEC GRAV UA: 1.01 (ref 1.010–1.025)
Urobilinogen, UA: 0.2 E.U./dL
pH, UA: 7.5 (ref 5.0–8.0)

## 2017-11-07 MED ORDER — METRONIDAZOLE 500 MG PO TABS: 500.0000 mg | ORAL_TABLET | Freq: Two times a day (BID) | ORAL | 0 refills | Status: DC

## 2017-11-07 NOTE — Progress Notes (Signed)
[name] stated that she is having contractions noticed them when she doing a lot. Pt also stated that she think she has a yeast infection; pt stated using Monistat but it is not working; no other complaints.

## 2017-11-07 NOTE — Progress Notes (Signed)
ROB: Patient complains of contractions, mostly when being active.  Also notes that she thinks she has a yeast infection, has been trying to treat with OTC Monistat but is not working.  Exam with cervix visibly closed, moderate amount of white discharge in vault. Discussed likely [facility]. Wet prep today positive for BV, will prescribe Flagyl.  RTC in 2 weeks for OB visit.

## 2017-11-14 ENCOUNTER — Other Ambulatory Visit: Payer: Self-pay

## 2017-11-14 ENCOUNTER — Observation Stay
Admission: EM | Admit: 2017-11-14 | Discharge: 2017-11-14 | Disposition: A | Discharge status: Home / Self Care | Payer: Medicaid Other | Attending: Obstetrics and Gynecology | Admitting: Obstetrics and Gynecology

## 2017-11-14 DIAGNOSIS — Z3A33 33 weeks gestation of pregnancy: Secondary | ICD-10-CM | POA: Diagnosis not present

## 2017-11-14 DIAGNOSIS — O4693 Antepartum hemorrhage, unspecified, third trimester: Principal | ICD-10-CM | POA: Insufficient documentation

## 2017-11-14 DIAGNOSIS — Z3490 Encounter for supervision of normal pregnancy, unspecified, unspecified trimester: Secondary | ICD-10-CM

## 2017-11-14 DIAGNOSIS — O4703 False labor before 37 completed weeks of gestation, third trimester: Secondary | ICD-10-CM | POA: Diagnosis not present

## 2017-11-14 MED ORDER — LACTATED RINGERS IV BOLUS
1000.0000 mL | Freq: Once | INTRAVENOUS | Status: AC
  Administered 2017-11-14: 1000 mL via INTRAVENOUS

## 2017-11-14 NOTE — OB Triage Note (Signed)
Pt presents from ED with complaints of contractions every 2-3 minutes, a small amount of bleeding and feeling "like she need's to push". Denies N/V/D and decreased fetal movement. Pt did have intercourse about an hour before the contractions started.

## 2017-11-15 NOTE — Final Progress Note (Signed)
L&D OB Triage Note  Stephanie MassonCierra Shannon is a 22 y.o. [id number] female at 1174w0d, EDD Estimated Date of Delivery: 01/03/18 who presented to triage for complaints of contractions, q 2-3 minutes, small amount of vaginal bleeding, and vaginal pressure.  Of note, patient had intercourse ~ 1 hr prior to onset of symptoms. She was evaluated by the nurses with no significant findings for preterm labor. Vital signs stable. An NST was performed and has been reviewed by MD.   NST INTERPRETATION: Indications: rule out uterine contractions  Mode: External Baseline Rate (A): 135 bpm Variability: Moderate Accelerations: 15 x 15 Decelerations: None     Contraction Frequency (min): occasional  Impression: reactive   Plan: NST performed was reviewed and was found to be reactive. She was discharged home with bleeding/preterm labor precautions. Advised on pelvic rest for several days.   Continue routine prenatal care. Follow up with OB/GYN as previously scheduled.     Hildred LaserAnika Harnoor Kohles, MD [facility]

## 2017-11-18 ENCOUNTER — Other Ambulatory Visit: Payer: Self-pay

## 2017-11-18 MED ORDER — FLUCONAZOLE 150 MG PO TABS: 150.0000 mg | ORAL_TABLET | Freq: Once | ORAL | 1 refills | Status: AC

## 2017-11-18 MED ORDER — FLUCONAZOLE 150 MG PO TABS: 150.0000 mg | ORAL_TABLET | Freq: Once | ORAL | 0 refills | Status: DC

## 2017-11-21 NOTE — Progress Notes (Signed)
Pt presents today for 34 week ROB. Pt requests cervix check since her visit to the ED last week.

## 2017-11-22 ENCOUNTER — Ambulatory Visit (INDEPENDENT_AMBULATORY_CARE_PROVIDER_SITE_OTHER): Payer: Medicaid Other | Admitting: Obstetrics and Gynecology

## 2017-11-22 ENCOUNTER — Encounter: Payer: Self-pay | Admitting: Obstetrics and Gynecology

## 2017-11-22 VITALS — BP 106/61 | HR 102 | Wt 157.0 lb

## 2017-11-22 DIAGNOSIS — O34219 Maternal care for unspecified type scar from previous cesarean delivery: Secondary | ICD-10-CM

## 2017-11-22 DIAGNOSIS — Z3A34 34 weeks gestation of pregnancy: Secondary | ICD-10-CM

## 2017-11-22 DIAGNOSIS — Z98891 History of uterine scar from previous surgery: Secondary | ICD-10-CM

## 2017-11-22 DIAGNOSIS — Z3483 Encounter for supervision of other normal pregnancy, third trimester: Secondary | ICD-10-CM

## 2017-11-22 LAB — POCT URINALYSIS DIPSTICK OB
BILIRUBIN UA: NEGATIVE
Blood, UA: NEGATIVE
Glucose, UA: NEGATIVE
Ketones, UA: NEGATIVE
LEUKOCYTES UA: NEGATIVE
NITRITE UA: NEGATIVE
PH UA: 6 (ref 5.0–8.0)
POC,PROTEIN,UA: NEGATIVE
Spec Grav, UA: 1.015 (ref 1.010–1.025)
UROBILINOGEN UA: 0.2 U/dL

## 2017-11-22 NOTE — Progress Notes (Signed)
ROB: Patient with "irregular contractions" but nothing as strong as when she went to labor and delivery.  Patient desires a cesarean delivery November 1-scheduled.

## 2017-12-03 ENCOUNTER — Observation Stay
Admission: EM | Admit: 2017-12-03 | Discharge: 2017-12-03 | Disposition: A | Discharge status: Home / Self Care | Payer: Medicaid Other | Attending: Obstetrics and Gynecology | Admitting: Obstetrics and Gynecology

## 2017-12-03 ENCOUNTER — Other Ambulatory Visit: Payer: Self-pay

## 2017-12-03 DIAGNOSIS — Z3490 Encounter for supervision of normal pregnancy, unspecified, unspecified trimester: Secondary | ICD-10-CM

## 2017-12-03 DIAGNOSIS — Z3A35 35 weeks gestation of pregnancy: Secondary | ICD-10-CM | POA: Diagnosis not present

## 2017-12-03 DIAGNOSIS — O4703 False labor before 37 completed weeks of gestation, third trimester: Secondary | ICD-10-CM | POA: Diagnosis present

## 2017-12-03 MED ORDER — ZOLPIDEM TARTRATE 5 MG PO TABS
ORAL_TABLET | ORAL | Status: AC
  Administered 2017-12-03: 5 mg
  Filled 2017-12-03: qty 1

## 2017-12-03 MED ORDER — ZOLPIDEM TARTRATE 5 MG PO TABS: 5.0000 mg | ORAL_TABLET | Freq: Once | ORAL | Status: DC

## 2017-12-04 NOTE — Discharge Summary (Addendum)
    L&D OB Triage Note  SUBJECTIVE [name] is a 22 y.o. [id number] female at [redacted]w[redacted]d, EDD Estimated Date of Delivery: 01/03/18 who presented to triage with complaints of contractions.   OB History  Gravida Para Term Preterm AB Living  4 1 1  0 2 1  SAB TAB Ectopic Multiple Live Births  2 0 0 0 1    # Outcome Date GA Lbr Len/2nd Weight Sex Delivery Anes PTL Lv  4 Current           3 Term 11/29/16 [redacted]w[redacted]d  3020 g M CS-LTranv EPI  LIV     Name: Ries,PENDINGBABY     Apgar1: 8  Apgar5: 9  2 SAB 2015        ND  1 SAB 2012        ND    No medications prior to admission.     OBJECTIVE  Nursing Evaluation:   BP 119/76 (BP Location: Left Arm)   Pulse (!) 114   Temp 98.1 F (36.7 C) (Oral)   Resp 18   Ht 4\' 11"  (1.499 m)   Wt 71.2 kg   LMP 03/29/2017   BMI 31.71 kg/m    Findings:   Irregular ctx's.  No cervical change.  Not in labor.  NST was performed and has been reviewed by me.  NST INTERPRETATION: Category I  Mode: External Baseline Rate (A): 145 bpm(fht) Variability: Moderate Accelerations: 15 x 15 Decelerations: None        ASSESSMENT Impression:  1.  Pregnancy:  [id number] at [redacted]w[redacted]d , EDD Estimated Date of Delivery: 01/03/18 2.  NST:  Category I  PLAN 1. Reassurance given 2. Discharge home with standard labor precautions given to return to L&D or call the office for problems. 3. Continue routine prenatal care.

## 2017-12-06 ENCOUNTER — Ambulatory Visit (INDEPENDENT_AMBULATORY_CARE_PROVIDER_SITE_OTHER): Payer: Medicaid Other | Admitting: Obstetrics and Gynecology

## 2017-12-06 ENCOUNTER — Encounter: Payer: Self-pay | Admitting: Obstetrics and Gynecology

## 2017-12-06 VITALS — BP 101/64 | HR 103 | Wt 159.3 lb

## 2017-12-06 DIAGNOSIS — N859 Noninflammatory disorder of uterus, unspecified: Secondary | ICD-10-CM

## 2017-12-06 DIAGNOSIS — Z98891 History of uterine scar from previous surgery: Secondary | ICD-10-CM

## 2017-12-06 DIAGNOSIS — N858 Other specified noninflammatory disorders of uterus: Secondary | ICD-10-CM

## 2017-12-06 DIAGNOSIS — Z3A36 36 weeks gestation of pregnancy: Secondary | ICD-10-CM

## 2017-12-06 DIAGNOSIS — Z3483 Encounter for supervision of other normal pregnancy, third trimester: Secondary | ICD-10-CM

## 2017-12-06 DIAGNOSIS — O9989 Other specified diseases and conditions complicating pregnancy, childbirth and the puerperium: Secondary | ICD-10-CM

## 2017-12-06 DIAGNOSIS — O34219 Maternal care for unspecified type scar from previous cesarean delivery: Secondary | ICD-10-CM

## 2017-12-06 LAB — POCT URINALYSIS DIPSTICK OB
BILIRUBIN UA: NEGATIVE
Blood, UA: NEGATIVE
GLUCOSE, UA: NEGATIVE
Ketones, UA: NEGATIVE
Nitrite, UA: NEGATIVE
PH UA: 7.5 (ref 5.0–8.0)
Spec Grav, UA: 1.015 (ref 1.010–1.025)
UROBILINOGEN UA: 1 U/dL

## 2017-12-06 NOTE — Progress Notes (Signed)
ROB: Patient notes having a lot of pain and pressure. Is having some contractions but occasional. Notes difficulty breathing due to positioning of the baby. States she is just miserable. Discussed comfort measures. Patient also notes low appetite as she feels she is always full. Discussed use of Boost/Ensure supplements or milkshakes. RTC in 1 week.

## 2017-12-06 NOTE — Progress Notes (Signed)
ROB-Pt stated that it is having a lot of pain/pressure, having contractions and is unable to breath due to baby. Pt had her 36 weeks cultures today.

## 2017-12-08 LAB — STREP GP B NAA: Strep Gp B NAA: NEGATIVE

## 2017-12-08 LAB — GC/CHLAMYDIA PROBE AMP
CHLAMYDIA, DNA PROBE: NEGATIVE
NEISSERIA GONORRHOEAE BY PCR: NEGATIVE

## 2017-12-11 ENCOUNTER — Other Ambulatory Visit: Payer: Self-pay

## 2017-12-11 ENCOUNTER — Observation Stay
Admission: EM | Admit: 2017-12-11 | Discharge: 2017-12-11 | Disposition: A | Discharge status: Home / Self Care | Payer: Medicaid Other | Attending: Obstetrics and Gynecology | Admitting: Obstetrics and Gynecology

## 2017-12-11 ENCOUNTER — Encounter: Payer: Self-pay | Admitting: *Deleted

## 2017-12-11 DIAGNOSIS — Z3A36 36 weeks gestation of pregnancy: Secondary | ICD-10-CM

## 2017-12-11 DIAGNOSIS — O34219 Maternal care for unspecified type scar from previous cesarean delivery: Principal | ICD-10-CM | POA: Insufficient documentation

## 2017-12-11 DIAGNOSIS — O4703 False labor before 37 completed weeks of gestation, third trimester: Secondary | ICD-10-CM

## 2017-12-11 DIAGNOSIS — O4290 Premature rupture of membranes, unspecified as to length of time between rupture and onset of labor, unspecified weeks of gestation: Secondary | ICD-10-CM | POA: Diagnosis present

## 2017-12-11 NOTE — Final Progress Note (Signed)
L&D OB Triage Note  [name] is a 22 y.o. [id number] female at [redacted]w[redacted]d, EDD Estimated Date of Delivery: 01/03/18 who presented to triage for complaints of leakage of fluid. Of note, she has a past obstetric history for C-section x 1.  She was evaluated by the nurses with no significant findings for ruptured membranes. Nitrazine testing was negative. Vital signs stable. An NST was performed and has been reviewed by MD.   NST INTERPRETATION: Indications: rule out uterine contractions  Mode: External Baseline Rate (A): 150 bpm Variability: Moderate Accelerations: 15 x 15 Decelerations: None     Contraction Frequency (min): no ctx   Impression: reactive   Plan: NST performed was reviewed and was found to be reactive. She was discharged home with bleeding/labor precautions.  Continue routine prenatal care. Follow up with OB/GYN as previously scheduled.     Hildred Laser, MD [facility]

## 2017-12-11 NOTE — OB Triage Note (Signed)
Thinks water broke @ approx 1400 today. Denies any odor, color. Denies vaginal bleeding. Denies having any ctx. [name]

## 2017-12-13 ENCOUNTER — Encounter: Payer: Self-pay | Admitting: Obstetrics and Gynecology

## 2017-12-13 ENCOUNTER — Ambulatory Visit (INDEPENDENT_AMBULATORY_CARE_PROVIDER_SITE_OTHER): Payer: Medicaid Other | Admitting: Obstetrics and Gynecology

## 2017-12-13 VITALS — BP 111/72 | HR 102 | Wt 162.0 lb

## 2017-12-13 DIAGNOSIS — Z3483 Encounter for supervision of other normal pregnancy, third trimester: Secondary | ICD-10-CM

## 2017-12-13 DIAGNOSIS — O34219 Maternal care for unspecified type scar from previous cesarean delivery: Secondary | ICD-10-CM

## 2017-12-13 DIAGNOSIS — Z98891 History of uterine scar from previous surgery: Secondary | ICD-10-CM

## 2017-12-13 LAB — POCT URINALYSIS DIPSTICK OB
Bilirubin, UA: NEGATIVE
Blood, UA: NEGATIVE
Glucose, UA: NEGATIVE
Ketones, UA: NEGATIVE
LEUKOCYTES UA: NEGATIVE
Nitrite, UA: NEGATIVE
Urobilinogen, UA: 0.2 E.U./dL
pH, UA: 6 (ref 5.0–8.0)

## 2017-12-13 NOTE — Progress Notes (Signed)
Pt presents today for ROB. Pt is doing well. 

## 2017-12-13 NOTE — Progress Notes (Signed)
ROB: Patient unhappy that she has not delivered yet.  States she is having trouble sleeping.  Notes occasional contractions.  Comfort measures again discussed.  Signs and symptoms of labor reviewed.

## 2017-12-17 ENCOUNTER — Observation Stay
Admission: EM | Admit: 2017-12-17 | Discharge: 2017-12-17 | Disposition: A | Discharge status: Home / Self Care | Payer: Medicaid Other | Attending: Obstetrics and Gynecology | Admitting: Obstetrics and Gynecology

## 2017-12-17 DIAGNOSIS — O471 False labor at or after 37 completed weeks of gestation: Secondary | ICD-10-CM | POA: Diagnosis not present

## 2017-12-17 DIAGNOSIS — Z3490 Encounter for supervision of normal pregnancy, unspecified, unspecified trimester: Secondary | ICD-10-CM

## 2017-12-17 DIAGNOSIS — O4703 False labor before 37 completed weeks of gestation, third trimester: Principal | ICD-10-CM | POA: Insufficient documentation

## 2017-12-17 DIAGNOSIS — Z3A37 37 weeks gestation of pregnancy: Secondary | ICD-10-CM | POA: Insufficient documentation

## 2017-12-17 NOTE — OB Triage Note (Addendum)
Patient came in with at [facility] complaining of intermittent back pain and vaginal pressure that started at 2230 on 12/16/17. Patient reports contractions throughout the day yesterday but "stopped and moved towards my back". Patient stated "I checked my own cervix and could fit three fingers in there".  Monitors placed and assessing. NST currently reactive and no contractions notes. Vital signs WNL. Denies vaginal bleeding. Denies LOF. Reports positive FM.

## 2017-12-17 NOTE — Discharge Instructions (Signed)
Follow up with sceduled OB appointment. Please no self cervical exams, there is a high risk for infections and risks.

## 2017-12-18 NOTE — Discharge Summary (Signed)
    L&D OB Triage Note  SUBJECTIVE [name] is a 22 y.o. [id number] female at [redacted]w[redacted]d, EDD Estimated Date of Delivery: 01/03/18 who presented to triage with complaints of pelvic pressure. Contractions yesterday.  OB History  Gravida Para Term Preterm AB Living  4 1 1  0 2 1  SAB TAB Ectopic Multiple Live Births  2 0 0 0 1    # Outcome Date GA Lbr Len/2nd Weight Sex Delivery Anes PTL Lv  4 Current           3 Term 11/29/16 [redacted]w[redacted]d  3020 g M CS-LTranv EPI  LIV     Name: Spoon,PENDINGBABY     Apgar1: 8  Apgar5: 9  2 SAB 2015        ND  1 SAB 2012        ND    No medications prior to admission.     OBJECTIVE  Nursing Evaluation:   BP 107/74 (BP Location: Left Arm)   Pulse (!) 116   Temp 97.9 F (36.6 C) (Oral)   Resp 16   LMP 03/29/2017    Findings:   Cervical exam unchanged.  Not in labor.  Reassuring FHTs  NST was performed and has been reviewed by me.  NST INTERPRETATION: Category I  Mode: External Baseline Rate (A): 125 bpm Variability: Moderate Accelerations: 15 x 15 Decelerations: None     Contraction Frequency (min): x1  ASSESSMENT Impression:  1.  Pregnancy:  [id number] at [redacted]w[redacted]d , EDD Estimated Date of Delivery: 01/03/18 2.  NST:  Category I  PLAN 1. Reassurance given 2. Discharge home with standard labor precautions given to return to L&D or call the office for problems. 3. Continue routine prenatal care.

## 2017-12-20 ENCOUNTER — Ambulatory Visit (INDEPENDENT_AMBULATORY_CARE_PROVIDER_SITE_OTHER): Payer: Medicaid Other | Admitting: Obstetrics and Gynecology

## 2017-12-20 ENCOUNTER — Encounter: Payer: Self-pay | Admitting: Obstetrics and Gynecology

## 2017-12-20 VITALS — BP 102/65 | HR 97 | Wt 163.0 lb

## 2017-12-20 DIAGNOSIS — O34219 Maternal care for unspecified type scar from previous cesarean delivery: Secondary | ICD-10-CM

## 2017-12-20 DIAGNOSIS — Z3483 Encounter for supervision of other normal pregnancy, third trimester: Secondary | ICD-10-CM

## 2017-12-20 DIAGNOSIS — Z98891 History of uterine scar from previous surgery: Secondary | ICD-10-CM

## 2017-12-20 LAB — POCT URINALYSIS DIPSTICK OB
Bilirubin, UA: NEGATIVE
Glucose, UA: NEGATIVE
Ketones, UA: NEGATIVE
Leukocytes, UA: NEGATIVE
NITRITE UA: NEGATIVE
PH UA: 6.5 (ref 5.0–8.0)
PROTEIN: NEGATIVE
RBC UA: NEGATIVE
Spec Grav, UA: 1.015 (ref 1.010–1.025)
UROBILINOGEN UA: 0.2 U/dL

## 2017-12-20 NOTE — Progress Notes (Signed)
Pt here for ROB. Pt states she is feeling contractions more and more often.

## 2017-12-20 NOTE — Progress Notes (Signed)
ROB: Patient still tired of being pregnant.  Denies contractions.  Reports fetal movement.  Patient has shown no cervical change since presentation to labor and delivery.

## 2017-12-28 ENCOUNTER — Ambulatory Visit (INDEPENDENT_AMBULATORY_CARE_PROVIDER_SITE_OTHER): Payer: Medicaid Other | Admitting: Obstetrics and Gynecology

## 2017-12-28 VITALS — BP 107/67 | HR 101 | Wt 164.4 lb

## 2017-12-28 DIAGNOSIS — Z98891 History of uterine scar from previous surgery: Secondary | ICD-10-CM

## 2017-12-28 DIAGNOSIS — O34219 Maternal care for unspecified type scar from previous cesarean delivery: Secondary | ICD-10-CM

## 2017-12-28 DIAGNOSIS — Z3483 Encounter for supervision of other normal pregnancy, third trimester: Secondary | ICD-10-CM

## 2017-12-28 LAB — POCT URINALYSIS DIPSTICK OB
Bilirubin, UA: NEGATIVE
Blood, UA: NEGATIVE
Glucose, UA: NEGATIVE
Ketones, UA: NEGATIVE
LEUKOCYTES UA: NEGATIVE
NITRITE UA: NEGATIVE
PROTEIN: NEGATIVE
SPEC GRAV UA: 1.01 (ref 1.010–1.025)
UROBILINOGEN UA: 0.2 U/dL
pH, UA: 7.5 (ref 5.0–8.0)

## 2017-12-28 NOTE — Progress Notes (Signed)
ROB-Pt stated that she was doing well. No problems or concerns.  

## 2017-12-28 NOTE — Progress Notes (Signed)
ROB: Still noting irregular mild contractions. Denies other complaints. Ready to have C-section on Friday. Discussed C-section in detail. RTC in 1 week for post-op check.

## 2017-12-29 ENCOUNTER — Encounter: Payer: Self-pay | Admitting: Obstetrics and Gynecology

## 2017-12-29 ENCOUNTER — Other Ambulatory Visit: Payer: Self-pay

## 2017-12-29 ENCOUNTER — Encounter
Admission: RE | Admit: 2017-12-29 | Discharge: 2017-12-29 | Disposition: A | Discharge status: Home / Self Care | Payer: Medicaid Other | Source: Ambulatory Visit | Attending: Obstetrics and Gynecology | Admitting: Obstetrics and Gynecology

## 2017-12-29 DIAGNOSIS — Z01812 Encounter for preprocedural laboratory examination: Secondary | ICD-10-CM | POA: Insufficient documentation

## 2017-12-29 HISTORY — DX: Other specified postprocedural states: Z98.890

## 2017-12-29 HISTORY — DX: Nausea with vomiting, unspecified: R11.2

## 2017-12-29 LAB — TYPE AND SCREEN
ABO/RH(D): O POS
Antibody Screen: NEGATIVE
EXTEND SAMPLE REASON: UNDETERMINED

## 2017-12-29 LAB — CBC
HCT: 30.7 % — ABNORMAL LOW (ref 36.0–46.0)
HEMOGLOBIN: 9.1 g/dL — AB (ref 12.0–15.0)
MCH: 22.3 pg — ABNORMAL LOW (ref 26.0–34.0)
MCHC: 29.6 g/dL — ABNORMAL LOW (ref 30.0–36.0)
MCV: 75.2 fL — ABNORMAL LOW (ref 80.0–100.0)
Platelets: 318 10*3/uL (ref 150–400)
RBC: 4.08 MIL/uL (ref 3.87–5.11)
RDW: 15.5 % (ref 11.5–15.5)
WBC: 8.2 10*3/uL (ref 4.0–10.5)
nRBC: 0 % (ref 0.0–0.2)

## 2017-12-29 NOTE — Patient Instructions (Signed)
Your procedure is scheduled on: Friday, December 30, 2017 Report to the Birth Place on the 3rd floor at 5:30 am. Enter the building through the emergency room entrance and you will be directed to the [employer].   REMEMBER: Instructions that are not followed completely may result in serious medical risk, up to and including death; or upon the discretion of your surgeon and anesthesiologist your surgery may need to be rescheduled.  Do not eat food after midnight the night before surgery.  No gum chewing, lozengers or hard candies.  You may however, drink water up to 2 hours before you are scheduled to arrive for your surgery. Do not drink anything within 2 hours of the start of your surgery.  No Alcohol for 24 hours before or after surgery.  No Smoking including e-cigarettes for 24 hours prior to surgery.  No chewable tobacco products for at least 6 hours prior to surgery.  No nicotine patches on the day of surgery.  On the morning of surgery brush your teeth with toothpaste and water, you may rinse your mouth with mouthwash if you wish. Do not swallow any toothpaste or mouthwash.  Notify your doctor if there is any change in your medical condition (cold, fever, infection).  Do not wear jewelry, make-up, hairpins, clips or nail polish.  Do not wear lotions, powders, or perfumes.   Do not shave 48 hours prior to surgery.   Contacts and dentures may not be worn into surgery.  Do not bring valuables to the hospital, including drivers license, insurance or credit cards.  [facility] is not responsible for any belongings or valuables.   TAKE THESE MEDICATIONS THE MORNING OF SURGERY:    1.  Pantoprazole (protonix) (take one the night before and one on the morning of surgery - helps to prevent nausea after surgery.)  Use CHG wipes as directed on instruction sheet.  NOW!  Stop Anti-inflammatories (NSAIDS) such as Advil, Aleve, Ibuprofen, Motrin, Naproxen, Naprosyn and Aspirin based  products such as Excedrin, Goodys Powder, BC Powder. (May take Tylenol or Acetaminophen if needed.)  NOW!  Stop ANY OVER THE COUNTER supplements until after surgery. (May continue multivitamin.)  Wear comfortable clothing (specific to your surgery type) to the hospital.  Plan for stool softeners for home use.  If you are being admitted to the hospital overnight, leave your suitcase in the car. After surgery it may be brought to your room.  If you are taking public transportation, you will need to have a responsible adult with you. Please confirm with your physician that it is acceptable to use public transportation.   Please call [phone] if you have any questions about these instructions.

## 2017-12-30 ENCOUNTER — Inpatient Hospital Stay: Payer: Medicaid Other | Admitting: Anesthesiology

## 2017-12-30 ENCOUNTER — Encounter
Admission: RE | Disposition: A | Discharge status: Home / Self Care | Payer: Self-pay | Source: Home / Self Care | Attending: Obstetrics and Gynecology

## 2017-12-30 ENCOUNTER — Inpatient Hospital Stay
Admission: RE | Admit: 2017-12-30 | Discharge: 2018-01-01 | DRG: 788 | Disposition: A | Discharge status: Home / Self Care | Payer: Medicaid Other | Attending: Obstetrics and Gynecology | Admitting: Obstetrics and Gynecology

## 2017-12-30 DIAGNOSIS — O9989 Other specified diseases and conditions complicating pregnancy, childbirth and the puerperium: Secondary | ICD-10-CM

## 2017-12-30 DIAGNOSIS — Z2839 Other underimmunization status: Secondary | ICD-10-CM

## 2017-12-30 DIAGNOSIS — O9902 Anemia complicating childbirth: Secondary | ICD-10-CM | POA: Diagnosis present

## 2017-12-30 DIAGNOSIS — E669 Obesity, unspecified: Secondary | ICD-10-CM | POA: Diagnosis present

## 2017-12-30 DIAGNOSIS — Z3A39 39 weeks gestation of pregnancy: Secondary | ICD-10-CM

## 2017-12-30 DIAGNOSIS — O34219 Maternal care for unspecified type scar from previous cesarean delivery: Secondary | ICD-10-CM

## 2017-12-30 DIAGNOSIS — O34211 Maternal care for low transverse scar from previous cesarean delivery: Principal | ICD-10-CM | POA: Diagnosis present

## 2017-12-30 DIAGNOSIS — F17210 Nicotine dependence, cigarettes, uncomplicated: Secondary | ICD-10-CM | POA: Diagnosis present

## 2017-12-30 DIAGNOSIS — F329 Major depressive disorder, single episode, unspecified: Secondary | ICD-10-CM | POA: Diagnosis present

## 2017-12-30 DIAGNOSIS — E66811 Obesity, class 1: Secondary | ICD-10-CM | POA: Diagnosis present

## 2017-12-30 DIAGNOSIS — D649 Anemia, unspecified: Secondary | ICD-10-CM | POA: Diagnosis present

## 2017-12-30 DIAGNOSIS — O99013 Anemia complicating pregnancy, third trimester: Secondary | ICD-10-CM | POA: Diagnosis present

## 2017-12-30 DIAGNOSIS — O99334 Smoking (tobacco) complicating childbirth: Secondary | ICD-10-CM | POA: Diagnosis present

## 2017-12-30 DIAGNOSIS — Z720 Tobacco use: Secondary | ICD-10-CM

## 2017-12-30 DIAGNOSIS — O09899 Supervision of other high risk pregnancies, unspecified trimester: Secondary | ICD-10-CM

## 2017-12-30 DIAGNOSIS — O99214 Obesity complicating childbirth: Secondary | ICD-10-CM | POA: Diagnosis present

## 2017-12-30 DIAGNOSIS — Z98891 History of uterine scar from previous surgery: Secondary | ICD-10-CM

## 2017-12-30 DIAGNOSIS — Z283 Underimmunization status: Secondary | ICD-10-CM

## 2017-12-30 DIAGNOSIS — F32A Depression, unspecified: Secondary | ICD-10-CM | POA: Diagnosis present

## 2017-12-30 SURGERY — Surgical Case
Anesthesia: Spinal

## 2017-12-30 MED ORDER — MORPHINE SULFATE (PF) 0.5 MG/ML IJ SOLN
INTRAMUSCULAR | Status: AC
  Filled 2017-12-30: qty 10

## 2017-12-30 MED ORDER — MEASLES, MUMPS & RUBELLA VAC ~~LOC~~ INJ
0.5000 mL | INJECTION | Freq: Once | SUBCUTANEOUS | Status: DC
  Filled 2017-12-30: qty 0.5

## 2017-12-30 MED ORDER — MORPHINE SULFATE (PF) 0.5 MG/ML IJ SOLN
INTRAMUSCULAR | Status: DC | PRN
  Administered 2017-12-30: .1 mg via INTRATHECAL

## 2017-12-30 MED ORDER — MEPERIDINE HCL 25 MG/ML IJ SOLN: 6.2500 mg | INTRAMUSCULAR | Status: DC | PRN

## 2017-12-30 MED ORDER — DIBUCAINE 1 % RE OINT: 1.0000 "application " | TOPICAL_OINTMENT | RECTAL | Status: DC | PRN

## 2017-12-30 MED ORDER — OXYTOCIN 10 UNIT/ML IJ SOLN
INTRAMUSCULAR | Status: AC
  Filled 2017-12-30: qty 2

## 2017-12-30 MED ORDER — OXYTOCIN 40 UNITS IN LACTATED RINGERS INFUSION - SIMPLE MED
INTRAVENOUS | Status: AC
  Administered 2017-12-30: 13:00:00
  Filled 2017-12-30: qty 1000

## 2017-12-30 MED ORDER — LIDOCAINE 5 % EX PTCH
1.0000 | MEDICATED_PATCH | CUTANEOUS | Status: DC
  Administered 2017-12-31: 1 via TRANSDERMAL
  Filled 2017-12-30: qty 1

## 2017-12-30 MED ORDER — OXYCODONE HCL 5 MG PO TABS: 10.0000 mg | ORAL_TABLET | ORAL | Status: DC | PRN

## 2017-12-30 MED ORDER — NALBUPHINE HCL 10 MG/ML IJ SOLN: 5.0000 mg | Freq: Once | INTRAMUSCULAR | Status: DC | PRN

## 2017-12-30 MED ORDER — OXYCODONE-ACETAMINOPHEN 5-325 MG PO TABS
1.0000 | ORAL_TABLET | ORAL | Status: DC | PRN
  Administered 2018-01-01: 1 via ORAL
  Filled 2017-12-30: qty 1

## 2017-12-30 MED ORDER — LACTATED RINGERS IV SOLN: INTRAVENOUS | Status: DC

## 2017-12-30 MED ORDER — WITCH HAZEL-GLYCERIN EX PADS: 1.0000 "application " | MEDICATED_PAD | CUTANEOUS | Status: DC | PRN

## 2017-12-30 MED ORDER — ONDANSETRON HCL 4 MG/2ML IJ SOLN: 4.0000 mg | Freq: Three times a day (TID) | INTRAMUSCULAR | Status: DC | PRN

## 2017-12-30 MED ORDER — FERROUS SULFATE 325 (65 FE) MG PO TABS
325.0000 mg | ORAL_TABLET | Freq: Two times a day (BID) | ORAL | Status: DC
  Administered 2017-12-30 – 2018-01-01 (×4): 325 mg via ORAL
  Filled 2017-12-30 (×4): qty 1

## 2017-12-30 MED ORDER — PHENYLEPHRINE HCL 10 MG/ML IJ SOLN
INTRAMUSCULAR | Status: AC
  Filled 2017-12-30: qty 1

## 2017-12-30 MED ORDER — OXYCODONE HCL 5 MG PO TABS: 5.0000 mg | ORAL_TABLET | ORAL | Status: DC | PRN

## 2017-12-30 MED ORDER — PRENATAL PLUS 27-1 MG PO TABS
1.0000 | ORAL_TABLET | Freq: Every day | ORAL | Status: DC
  Administered 2017-12-30 – 2017-12-31 (×2): 1 via ORAL
  Filled 2017-12-30 (×2): qty 1

## 2017-12-30 MED ORDER — LIDOCAINE HCL (PF) 1 % IJ SOLN
INTRAMUSCULAR | Status: AC
  Filled 2017-12-30: qty 30

## 2017-12-30 MED ORDER — SIMETHICONE 80 MG PO CHEW: 80.0000 mg | CHEWABLE_TABLET | ORAL | Status: DC | PRN

## 2017-12-30 MED ORDER — OXYTOCIN 40 UNITS IN LACTATED RINGERS INFUSION - SIMPLE MED
INTRAVENOUS | Status: AC
  Filled 2017-12-30: qty 1000

## 2017-12-30 MED ORDER — DIPHENHYDRAMINE HCL 25 MG PO CAPS: 25.0000 mg | ORAL_CAPSULE | Freq: Four times a day (QID) | ORAL | Status: DC | PRN

## 2017-12-30 MED ORDER — DEXAMETHASONE SODIUM PHOSPHATE 10 MG/ML IJ SOLN
INTRAMUSCULAR | Status: AC
  Filled 2017-12-30: qty 1

## 2017-12-30 MED ORDER — KETOROLAC TROMETHAMINE 30 MG/ML IJ SOLN
INTRAMUSCULAR | Status: AC
  Administered 2017-12-30: 30 mg
  Filled 2017-12-30: qty 1

## 2017-12-30 MED ORDER — IBUPROFEN 800 MG PO TABS
800.0000 mg | ORAL_TABLET | Freq: Four times a day (QID) | ORAL | Status: DC
  Administered 2017-12-30 – 2018-01-01 (×7): 800 mg via ORAL
  Filled 2017-12-30 (×7): qty 1

## 2017-12-30 MED ORDER — LACTATED RINGERS IV SOLN
INTRAVENOUS | Status: DC
  Administered 2017-12-30: 08:00:00 via INTRAVENOUS

## 2017-12-30 MED ORDER — PHENYLEPHRINE HCL 10 MG/ML IJ SOLN
INTRAMUSCULAR | Status: DC | PRN
  Administered 2017-12-30 (×2): 100 ug via INTRAVENOUS

## 2017-12-30 MED ORDER — LIDOCAINE 5 % EX PTCH
MEDICATED_PATCH | CUTANEOUS | Status: DC | PRN
  Administered 2017-12-30: 1 via TRANSDERMAL

## 2017-12-30 MED ORDER — KETOROLAC TROMETHAMINE 30 MG/ML IJ SOLN: 30.0000 mg | Freq: Four times a day (QID) | INTRAMUSCULAR | Status: AC

## 2017-12-30 MED ORDER — ONDANSETRON HCL 4 MG/2ML IJ SOLN
INTRAMUSCULAR | Status: DC | PRN
  Administered 2017-12-30: 4 mg via INTRAVENOUS

## 2017-12-30 MED ORDER — ONDANSETRON HCL 4 MG/2ML IJ SOLN
INTRAMUSCULAR | Status: AC
  Filled 2017-12-30: qty 2

## 2017-12-30 MED ORDER — ACETAMINOPHEN 325 MG PO TABS
ORAL_TABLET | ORAL | Status: AC
  Filled 2017-12-30: qty 2

## 2017-12-30 MED ORDER — DIPHENHYDRAMINE HCL 50 MG/ML IJ SOLN
12.5000 mg | INTRAMUSCULAR | Status: DC | PRN
  Administered 2017-12-30: 12.5 mg via INTRAVENOUS
  Filled 2017-12-30: qty 1

## 2017-12-30 MED ORDER — OXYCODONE-ACETAMINOPHEN 5-325 MG PO TABS
2.0000 | ORAL_TABLET | ORAL | Status: DC | PRN
  Administered 2017-12-31 – 2018-01-01 (×6): 2 via ORAL
  Filled 2017-12-30 (×6): qty 2

## 2017-12-30 MED ORDER — NALBUPHINE HCL 10 MG/ML IJ SOLN: 5.0000 mg | INTRAMUSCULAR | Status: DC | PRN

## 2017-12-30 MED ORDER — ACETAMINOPHEN 325 MG PO TABS
650.0000 mg | ORAL_TABLET | Freq: Four times a day (QID) | ORAL | Status: AC
  Administered 2017-12-30 – 2017-12-31 (×3): 650 mg via ORAL
  Filled 2017-12-30 (×2): qty 2

## 2017-12-30 MED ORDER — OXYTOCIN 40 UNITS IN LACTATED RINGERS INFUSION - SIMPLE MED
2.5000 [IU]/h | INTRAVENOUS | Status: AC
  Filled 2017-12-30: qty 1000

## 2017-12-30 MED ORDER — BUPIVACAINE IN DEXTROSE 0.75-8.25 % IT SOLN
INTRATHECAL | Status: DC | PRN
  Administered 2017-12-30: 1.6 mL via INTRATHECAL

## 2017-12-30 MED ORDER — AMMONIA AROMATIC IN INHA
RESPIRATORY_TRACT | Status: AC
  Filled 2017-12-30: qty 10

## 2017-12-30 MED ORDER — NALOXONE HCL 0.4 MG/ML IJ SOLN: 0.4000 mg | INTRAMUSCULAR | Status: DC | PRN

## 2017-12-30 MED ORDER — ACETAMINOPHEN 325 MG PO TABS: 650.0000 mg | ORAL_TABLET | ORAL | Status: DC | PRN

## 2017-12-30 MED ORDER — MISOPROSTOL 200 MCG PO TABS
ORAL_TABLET | ORAL | Status: AC
  Filled 2017-12-30: qty 4

## 2017-12-30 MED ORDER — PRENATAL MULTIVITAMIN CH: 1.0000 | ORAL_TABLET | Freq: Every day | ORAL | Status: DC

## 2017-12-30 MED ORDER — LIDOCAINE 5 % EX PTCH
MEDICATED_PATCH | CUTANEOUS | Status: AC
  Filled 2017-12-30: qty 1

## 2017-12-30 MED ORDER — COCONUT OIL OIL: 1.0000 "application " | TOPICAL_OIL | Status: DC | PRN

## 2017-12-30 MED ORDER — ZOLPIDEM TARTRATE 5 MG PO TABS: 5.0000 mg | ORAL_TABLET | Freq: Every evening | ORAL | Status: DC | PRN

## 2017-12-30 MED ORDER — MAGNESIUM HYDROXIDE 400 MG/5ML PO SUSP: 30.0000 mL | ORAL | Status: DC | PRN

## 2017-12-30 MED ORDER — KETOROLAC TROMETHAMINE 30 MG/ML IJ SOLN
30.0000 mg | Freq: Four times a day (QID) | INTRAMUSCULAR | Status: AC
  Administered 2017-12-30: 30 mg via INTRAVENOUS
  Filled 2017-12-30: qty 1

## 2017-12-30 MED ORDER — SODIUM CHLORIDE 0.9 % IV SOLN
INTRAVENOUS | Status: DC | PRN
  Administered 2017-12-30: 50 ug/min via INTRAVENOUS

## 2017-12-30 MED ORDER — DEXAMETHASONE SODIUM PHOSPHATE 10 MG/ML IJ SOLN
INTRAMUSCULAR | Status: DC | PRN
  Administered 2017-12-30: 10 mg via INTRAVENOUS

## 2017-12-30 MED ORDER — SOD CITRATE-CITRIC ACID 500-334 MG/5ML PO SOLN
ORAL | Status: AC
  Filled 2017-12-30: qty 15

## 2017-12-30 MED ORDER — SODIUM CHLORIDE 0.9% FLUSH: 3.0000 mL | INTRAVENOUS | Status: DC | PRN

## 2017-12-30 MED ORDER — SOD CITRATE-CITRIC ACID 500-334 MG/5ML PO SOLN
ORAL | Status: AC
  Filled 2017-12-30: qty 30

## 2017-12-30 MED ORDER — FENTANYL CITRATE (PF) 100 MCG/2ML IJ SOLN
INTRAMUSCULAR | Status: DC | PRN
  Administered 2017-12-30: 15 ug via INTRATHECAL

## 2017-12-30 MED ORDER — EPHEDRINE SULFATE 50 MG/ML IJ SOLN
INTRAMUSCULAR | Status: AC
  Filled 2017-12-30: qty 1

## 2017-12-30 MED ORDER — MENTHOL 3 MG MT LOZG
1.0000 | LOZENGE | OROMUCOSAL | Status: DC | PRN
  Filled 2017-12-30: qty 9

## 2017-12-30 MED ORDER — SIMETHICONE 80 MG PO CHEW
80.0000 mg | CHEWABLE_TABLET | ORAL | Status: DC
  Administered 2017-12-31 (×2): 80 mg via ORAL
  Filled 2017-12-30 (×2): qty 1

## 2017-12-30 MED ORDER — DIPHENHYDRAMINE HCL 25 MG PO CAPS: 25.0000 mg | ORAL_CAPSULE | ORAL | Status: DC | PRN

## 2017-12-30 MED ORDER — SENNOSIDES-DOCUSATE SODIUM 8.6-50 MG PO TABS
2.0000 | ORAL_TABLET | ORAL | Status: DC
  Administered 2017-12-31 (×2): 2 via ORAL
  Filled 2017-12-30 (×2): qty 2

## 2017-12-30 MED ORDER — CEFAZOLIN SODIUM-DEXTROSE 2-4 GM/100ML-% IV SOLN
2.0000 g | Freq: Once | INTRAVENOUS | Status: AC
  Administered 2017-12-30: 2 g via INTRAVENOUS
  Filled 2017-12-30 (×2): qty 100

## 2017-12-30 MED ORDER — FENTANYL CITRATE (PF) 100 MCG/2ML IJ SOLN
INTRAMUSCULAR | Status: AC
  Filled 2017-12-30: qty 2

## 2017-12-30 MED ORDER — OXYTOCIN 40 UNITS IN LACTATED RINGERS INFUSION - SIMPLE MED
INTRAVENOUS | Status: DC | PRN
  Administered 2017-12-30: 500 mL via INTRAVENOUS

## 2017-12-30 MED ORDER — SOD CITRATE-CITRIC ACID 500-334 MG/5ML PO SOLN
30.0000 mL | ORAL | Status: AC
  Administered 2017-12-30: 30 mL via ORAL

## 2017-12-30 SURGICAL SUPPLY — 26 items
BAG COUNTER SPONGE EZ (MISCELLANEOUS) ×2 IMPLANT
CANISTER SUCT 3000ML PPV (MISCELLANEOUS) ×3 IMPLANT
CHLORAPREP W/TINT 26ML (MISCELLANEOUS) ×6 IMPLANT
COUNTER SPONGE BAG EZ (MISCELLANEOUS) ×1
COVER WAND RF STERILE (DRAPES) IMPLANT
DERMABOND ADVANCED (GAUZE/BANDAGES/DRESSINGS) ×2
DERMABOND ADVANCED .7 DNX12 (GAUZE/BANDAGES/DRESSINGS) ×1 IMPLANT
DRSG TELFA 3X8 NADH (GAUZE/BANDAGES/DRESSINGS) IMPLANT
ELECT REM PT RETURN 9FT ADLT (ELECTROSURGICAL) ×3
ELECTRODE REM PT RTRN 9FT ADLT (ELECTROSURGICAL) ×1 IMPLANT
GAUZE SPONGE 4X4 12PLY STRL (GAUZE/BANDAGES/DRESSINGS) IMPLANT
GLOVE BIO SURGEON STRL SZ 6.5 (GLOVE) ×6 IMPLANT
GLOVE BIO SURGEONS STRL SZ 6.5 (GLOVE) ×3
GLOVE INDICATOR 7.0 STRL GRN (GLOVE) ×9 IMPLANT
GOWN STRL REUS W/ TWL LRG LVL3 (GOWN DISPOSABLE) ×3 IMPLANT
GOWN STRL REUS W/TWL LRG LVL3 (GOWN DISPOSABLE) ×6
KIT TURNOVER KIT A (KITS) ×3 IMPLANT
NS IRRIG 1000ML POUR BTL (IV SOLUTION) ×3 IMPLANT
PACK C SECTION AR (MISCELLANEOUS) ×3 IMPLANT
PAD OB MATERNITY 4.3X12.25 (PERSONAL CARE ITEMS) ×3 IMPLANT
PAD PREP 24X41 OB/GYN DISP (PERSONAL CARE ITEMS) ×3 IMPLANT
SUT MNCRL AB 4-0 PS2 18 (SUTURE) ×3 IMPLANT
SUT PLAIN 2 0 XLH (SUTURE) IMPLANT
SUT VIC AB 0 CT1 36 (SUTURE) ×12 IMPLANT
SUT VIC AB 3-0 SH 27 (SUTURE) ×2
SUT VIC AB 3-0 SH 27X BRD (SUTURE) ×1 IMPLANT

## 2017-12-30 NOTE — Progress Notes (Signed)
Dr. Valentino Saxon notified that pt removed her own IV, and then called RN to say the IV "came out".  Pt is refusing to wear SCDs.  Dr. Valentino Saxon states to leave IV out and to call if bleeding increases. Reynold Bowen, RN 12/30/2017 9:07 PM

## 2017-12-30 NOTE — H&P (Signed)
Obstetric Preoperative History and Physical  [name] is a 22 y.o. [id number] with IUP at [redacted]w[redacted]d presenting for presenting for scheduled repeat cesarean section.  No acute concerns.   Prenatal Course Source of Care: [facility] with onset of care at 9 weeks Pregnancy complications or risks: Patient Active Problem List   Diagnosis Date Noted  . Leakage of amniotic fluid 12/11/2017  . Leakage, amniotic fluid 12/11/2017  . Pregnancy 11/14/2017  . Anemia of pregnancy in third trimester 10/29/2017  . Increased BMI 05/11/2017  . Drug use 05/11/2017  . Unsure of LMP (last menstrual period) as reason for ultrasound scan 05/11/2017  . Tobacco user 01/11/2017  . History of C-section 11/29/2016  . Rubella non-immune status, antepartum 04/19/2016  . Nausea/vomiting in pregnancy   . Acquired acanthosis nigricans 11/11/2010  . Depressive disorder 07/19/2010  . Obesity (BMI 30.0-34.9) 07/19/2010  . Extrinsic asthma 04/03/2009   She plans to bottle feed She desires Nexplanon for postpartum contraception.   Prenatal labs and studies: ABO, Rh: --/--/O POS (10/31 1134) Antibody: NEG (10/31 1134) Rubella: 2.21 (04/02 1513) RPR: Non Reactive (08/14 0930)  HBsAg: Negative (04/02 1513)  HIV: Non Reactive (04/02 1513)  AVW:UJWJXBJY (10/08 1546) 1 hr Glucola  normal Genetic screening normal Anatomy US normal   OB History  Gravida Para Term Preterm AB Living  4 1 1   2 1   SAB TAB Ectopic Multiple Live Births  2     0 1    # Outcome Date GA Lbr Len/2nd Weight Sex Delivery Anes PTL Lv  4 Current           3 Term 11/29/16 [redacted]w[redacted]d  3020 g M CS-LTranv EPI  LIV  2 SAB 2015        ND  1 SAB 2012        ND    Past Medical History:  Diagnosis Date  . Abdominal pain   . Asthma   . History of hypertension   . Hypertension   . Nausea   . Peptic ulcer disease   . PONV (postoperative nausea and vomiting)   . Pre-diabetes (history)     Past Surgical History:  Procedure  Laterality Date  . CESAREAN SECTION N/A 11/29/2016   Procedure: CESAREAN SECTION;  Surgeon: Herold Harms, MD;  Location: [facility] ORS;  Service: Obstetrics;  Laterality: N/A;  . TONSILLECTOMY    . WISDOM TOOTH EXTRACTION      . Social History   Socioeconomic History  . Marital status: Single    Spouse name: Not on file  . Number of children: Not on file  . Years of education: Not on file  . Highest education level: Not on file  Occupational History  . Not on file  Social Needs  . Financial resource strain: Not on file  . Food insecurity:    Worry: Not on file    Inability: Not on file  . Transportation needs:    Medical: Not on file    Non-medical: Not on file  Tobacco Use  . Smoking status: Current Every Day Smoker    Packs/day: 0.25    Types: Cigarettes    Last attempt to quit: 03/24/2016    Years since quitting: 1.7  . Smokeless tobacco: Never Used  Substance and Sexual Activity  . Alcohol use: No  . Drug use: Not Currently    Comment: Denies current use   . Sexual activity: Yes    Partners: Male    Birth  control/protection: None  Lifestyle  . Physical activity:    Days per week: Not on file    Minutes per session: Not on file  . Stress: Not on file  Relationships  . Social connections:    Talks on phone: Not on file    Gets together: Not on file    Attends religious service: Not on file    Active member of club or organization: Not on file    Attends meetings of clubs or organizations: Not on file    Relationship status: Not on file  Other Topics Concern  . Not on file  Social History Narrative   11th grade 2014-2015    Family History  Problem Relation Age of Onset  . Cholelithiasis Mother   . SIDS Brother   . Cholelithiasis Maternal Grandmother   . Ulcers Maternal Grandmother   . Diabetes Maternal Grandfather   . Celiac disease Neg Hx     Medications Prior to Admission  Medication Sig Dispense Refill Last Dose  . ondansetron (ZOFRAN) 4 MG  tablet TAKE 1 TABLET BY MOUTH EVERY 8 HOURS AS NEEDED FOR NAUSEA AND VOMITING 30 tablet 2 Taking  . pantoprazole (PROTONIX) 20 MG tablet Take 1 tablet (20 mg total) by mouth 2 (two) times daily before a meal. 60 tablet 3 Taking  . Prenatal Vit-Fe Fumarate-FA (MULTIVITAMIN-PRENATAL) 27-0.8 MG TABS tablet Take 1 tablet by mouth daily at 12 noon.   Taking    Allergies  Allergen Reactions  . Metformin And Related Shortness Of Breath  . Sprintec 28 [Norgestimate-Eth Estradiol] Hives    Review of Systems: Negative except for what is mentioned in HPI.  Physical Exam: BP 114/67   Pulse 85   Temp 97.8 F (36.6 C) (Oral)   Resp 20   Ht 4\' 11"  (1.499 m)   Wt 73.9 kg   LMP 03/29/2017   BMI 32.92 kg/m  FHR by Doppler: 135 bpm GENERAL: Well-developed, well-nourished female in no acute distress.  LUNGS: Clear to auscultation bilaterally.  HEART: Regular rate and rhythm. ABDOMEN: Soft, nontender, nondistended, gravid, well-healed Pfannenstiel incision. PELVIC: Deferred EXTREMITIES: Nontender, no edema, 2+ distal pulses.   Pertinent Labs/Studies:   Results for orders placed or performed during the hospital encounter of 12/29/17 (from the past 72 hour(s))  CBC     Status: Abnormal   Collection Time: 12/29/17 11:34 AM  Result Value Ref Range   WBC 8.2 4.0 - 10.5 K/uL   RBC 4.08 3.87 - 5.11 MIL/uL   Hemoglobin 9.1 (L) 12.0 - 15.0 g/dL   HCT 01.0 (L) 93.2 - 35.5 %   MCV 75.2 (L) 80.0 - 100.0 fL   MCH 22.3 (L) 26.0 - 34.0 pg   MCHC 29.6 (L) 30.0 - 36.0 g/dL   RDW 73.2 20.2 - 54.2 %   Platelets 318 150 - 400 K/uL   nRBC 0.0 0.0 - 0.2 %    Comment: Performed at [facility], [address] [place], [place] [address]  Type and screen     Status: None   Collection Time: 12/29/17 11:34 AM  Result Value Ref Range   ABO/RH(D) O POS    Antibody Screen NEG    Sample Expiration 01/01/2018    Extend sample reason      PREGNANT WITHIN 3 MONTHS, UNABLE TO EXTEND Performed at [facility], [address], [place], [place] [address]     Assessment and Plan :[name] is a 22 y.o. [phone] at [redacted]w[redacted]d being  admitted  for scheduled repeat cesarean section delivery . The patient is understanding of the planned procedure and is aware of and accepting of all surgical risks, including but not limited to: bleeding which may require transfusion or reoperation; infection which may require antibiotics; injury to bowel, bladder, ureters or other surrounding organs which may require repair; injury to the fetus; need for additional procedures including hysterectomy in the event of life-threatening complications; placental abnormalities wth subsequent pregnancies; incisional problems; blood clot disorders which may require blood thinners;, and other postoperative/anesthesia complications. The patient is in agreement with the proposed plan, and gives informed written consent for the procedure. All questions have been answered.   Hildred Laser, MD [facility]

## 2017-12-30 NOTE — Op Note (Signed)
Cesarean Section Procedure Note  Indications: previous uterine incision low transverse C-section x 1  Pre-operative Diagnosis: 39 week 3 day pregnancy, mild obesity, h/o C-section x 1 (low-transverse).  Post-operative Diagnosis: Same  Surgeon: Hildred Laser, MD  Assistants: Surgical Scrub Tech, Channing Mutters.   Procedure: Repeat low transverse Cesarean Section  Anesthesia: Spinal anesthesia  Procedure Details: The patient was seen in the Holding Room. The risks, benefits, complications, treatment options, and expected outcomes were discussed with the patient.  The patient concurred with the proposed plan, giving informed consent.  The site of surgery properly noted/marked. The patient was taken to the Operating Room, identified as [name] and the procedure verified as C-Section Delivery. A Time Out was held and the above information confirmed.  After induction of anesthesia, the patient was draped and prepped in the usual sterile manner. Anesthesia was tested and noted to be adequate. A Pfannenstiel incision was made and carried down through the subcutaneous tissue to the fascia. Fascial incision was made and extended transversely. The fascia was separated from the underlying rectus tissue superiorly and inferiorly. The peritoneum was identified and entered. Peritoneal incision was extended longitudinally. The surgical assist was able to provide retraction to allow for clear visualization of surgical site. The utero-vesical peritoneal reflection was incised transversely and the bladder flap was bluntly freed from the lower uterine segment. A low transverse uterine incision was made. Delivered from cephalic presentation was a 3040 gram Female with Apgar scores of 8 at one minute and 9 at five minutes.  The assistant was able to apply adequate fundal pressure to allow for successful delivery of the fetus. After the umbilical cord was clamped and cut cord blood was obtained for evaluation. The  placenta was removed intact and appeared normal. The uterus was exteriorized and cleared of all clots and debris. The uterine outline, tubes and ovaries appeared normal.  The uterine incision was closed with running locked sutures of 0-Vicryl.  A second suture of 0-Vicryl was used in an imbricating layer.  Hemostasis was observed. Lavage was carried out until clear. The fascia was then reapproximated with a running suture of 1-0 Vicryl. The subcutaneous fat layer was reapproximated with 3-0 Vicryl. skin was reapproximated with 4-0 Monocryl.  Instrument, sponge, and needle counts were correct prior the abdominal closure and at the conclusion of the case.   Findings: Female infant, cephalic presentation, 3040 grams, with Apgar scores of 8 at one minute and 9 at five minutes. Intact placenta with 3 vessel cord.  Clear amniotic fluid at amniotomy. The uterine outline, tubes and ovaries appeared normal.   Estimated Blood Loss:  600 ml      Drains: foley catheter to gravity drainage, 200 ml clear urine at end of the procedure         Total IV Fluids:  1000 ml of Lactated Ringer's  Specimens: None         Implants: None         Complications:  None; patient tolerated the procedure well.         Disposition: PACU - hemodynamically stable.         Condition: stable   Hildred Laser, MD [facility]

## 2017-12-30 NOTE — Anesthesia Post-op Follow-up Note (Signed)
Anesthesia QCDR form completed.        

## 2017-12-30 NOTE — Anesthesia Procedure Notes (Signed)
Spinal  Patient location during procedure: OR Staffing Anesthesiologist: Emmie Niemann, MD Resident/CRNA: Rolla Plate, CRNA Performed: resident/CRNA  Preanesthetic Checklist Completed: patient identified, site marked, surgical consent, pre-op evaluation, timeout performed, IV checked, risks and benefits discussed and monitors and equipment checked Spinal Block Patient position: sitting Prep: ChloraPrep and site prepped and draped Patient monitoring: heart rate, continuous pulse ox, blood pressure and cardiac monitor Approach: midline Location: L4-5 Injection technique: single-shot Needle Needle type: Introducer and Pencan  Needle gauge: 24 G Needle length: 9 cm Assessment Sensory level: T4 Additional Notes Negative paresthesia. Negative blood return. Positive free-flowing CSF. Expiration date of kit checked and confirmed. Patient tolerated procedure well, without complications.

## 2017-12-30 NOTE — Anesthesia Preprocedure Evaluation (Signed)
Anesthesia Evaluation  Patient identified by MRN, date of birth, ID band Patient awake    Reviewed: Allergy & Precautions, NPO status , Patient's Chart, lab work & pertinent test results  History of Anesthesia Complications (+) PONV and history of anesthetic complications  Airway Mallampati: II  TM Distance: >3 FB Neck ROM: Full    Dental no notable dental hx.    Pulmonary asthma , Current Smoker,    breath sounds clear to auscultation- rhonchi (-) wheezing      Cardiovascular (-) CAD, (-) Past MI, (-) Cardiac Stents and (-) CABG  Rhythm:Regular Rate:Normal - Systolic murmurs and - Diastolic murmurs    Neuro/Psych PSYCHIATRIC DISORDERS Depression negative neurological ROS     GI/Hepatic Neg liver ROS, PUD,   Endo/Other  negative endocrine ROSneg diabetes  Renal/GU negative Renal ROS     Musculoskeletal negative musculoskeletal ROS (+)   Abdominal Gravid abdomen  Peds  Hematology  (+) anemia ,   Anesthesia Other Findings Past Medical History: No date: Abdominal pain No date: Asthma No date: History of hypertension No date: Hypertension No date: Nausea No date: Peptic ulcer disease No date: PONV (postoperative nausea and vomiting) No date: Pre-diabetes (history)   Reproductive/Obstetrics (+) Pregnancy                             Anesthesia Physical Anesthesia Plan  ASA: II  Anesthesia Plan: Spinal   Post-op Pain Management:    Induction:   PONV Risk Score and Plan: 2 and Ondansetron  Airway Management Planned: Natural Airway  Additional Equipment:   Intra-op Plan:   Post-operative Plan:   Informed Consent: I have reviewed the patients History and Physical, chart, labs and discussed the procedure including the risks, benefits and alternatives for the proposed anesthesia with the patient or authorized representative who has indicated his/her understanding and acceptance.    Dental advisory given  Plan Discussed with: CRNA and Anesthesiologist  Anesthesia Plan Comments:         Anesthesia Quick Evaluation

## 2017-12-30 NOTE — Transfer of Care (Signed)
Immediate Anesthesia Transfer of Care Note  Patient: [name]  Procedure(s) Performed: REPEAT CESAREAN SECTION (N/A )  Patient Location: PACU  Anesthesia Type:Spinal  Level of Consciousness: awake, alert  and oriented  Airway & Oxygen Therapy: Patient Spontanous Breathing and Patient connected to nasal cannula oxygen  Post-op Assessment: Report given to RN and Post -op Vital signs reviewed and stable  Post vital signs: Reviewed and stable  Last Vitals:  Vitals Value Taken Time  BP    Temp    Pulse    Resp    SpO2      Last Pain:  Vitals:   12/30/17 0709  TempSrc: Oral  PainSc:          Complications: No apparent anesthesia complications

## 2017-12-31 LAB — HIV-1/2 AB - DIFFERENTIATION
HIV 1 AB: NEGATIVE
HIV 2 AB: NEGATIVE
Note: NEGATIVE

## 2017-12-31 LAB — CBC
HEMATOCRIT: 28.9 % — AB (ref 36.0–46.0)
Hemoglobin: 8.6 g/dL — ABNORMAL LOW (ref 12.0–15.0)
MCH: 22.6 pg — AB (ref 26.0–34.0)
MCHC: 29.8 g/dL — ABNORMAL LOW (ref 30.0–36.0)
MCV: 75.9 fL — ABNORMAL LOW (ref 80.0–100.0)
Platelets: 294 10*3/uL (ref 150–400)
RBC: 3.81 MIL/uL — ABNORMAL LOW (ref 3.87–5.11)
RDW: 15.2 % (ref 11.5–15.5)
WBC: 14 10*3/uL — ABNORMAL HIGH (ref 4.0–10.5)
nRBC: 0 % (ref 0.0–0.2)

## 2017-12-31 LAB — RNA QUALITATIVE

## 2017-12-31 LAB — RPR: RPR Ser Ql: NONREACTIVE

## 2017-12-31 MED ORDER — PNEUMOCOCCAL VAC POLYVALENT 25 MCG/0.5ML IJ INJ
0.5000 mL | INJECTION | INTRAMUSCULAR | Status: DC
  Filled 2017-12-31: qty 0.5

## 2017-12-31 NOTE — Progress Notes (Signed)
Postpartum Day # 1: Cesarean Delivery (repeat)  Subjective: Patient reports tolerating PO, + flatus and no problems voiding.  Ambulating without difficulty.   Objective: Vital signs in last 24 hours: Temp:  [97.7 F (36.5 C)-98.2 F (36.8 C)] 97.9 F (36.6 C) (11/02 0729) Pulse Rate:  [73-100] 91 (11/02 0729) Resp:  [18-20] 18 (11/02 0729) BP: (108-116)/(65-74) 115/74 (11/02 0729) SpO2:  [99 %-100 %] 100 % (11/02 0729)  Physical Exam:  General: alert and no distress Lungs: clear to auscultation bilaterally Breasts: normal appearance, no masses or tenderness Heart: regular rate and rhythm, S1, S2 normal, no murmur, click, rub or gallop Abdomen: soft, non-tender; bowel sounds normal; no masses,  no organomegaly Pelvis: Lochia appropriate, Uterine Fundus firm, Incision: healing well, no significant drainage, no dehiscence, no significant erythema Extremities: DVT Evaluation: No evidence of DVT seen on physical exam. Negative Homan's sign.  No cords or calf tenderness. No significant calf/ankle edema.  Recent Labs    12/29/17 1134 12/31/17 0434  HGB 9.1* 8.6*  HCT 30.7* 28.9*    Assessment/Plan: Status post Cesarean section. Doing well postoperatively.  Breastfeeding and bottle feeding.  Lactation consult as needed Advanced diet to regular Continue PO pain management Nexplanon for contraception Plan for discharge tomorrow Anemia of pregnancy, exacerbated by surgical loss. Patient remains asymptomatic. Will treat with PO iron supplementation.  Continue current care.  Hildred Laser, MD [facility]

## 2018-01-01 MED ORDER — DOCUSATE SODIUM 100 MG PO CAPS: 100.0000 mg | ORAL_CAPSULE | Freq: Two times a day (BID) | ORAL | 2 refills | Status: DC | PRN

## 2018-01-01 MED ORDER — IBUPROFEN 800 MG PO TABS: 800.0000 mg | ORAL_TABLET | Freq: Four times a day (QID) | ORAL | 1 refills | Status: DC

## 2018-01-01 MED ORDER — OXYCODONE-ACETAMINOPHEN 5-325 MG PO TABS: 1.0000 | ORAL_TABLET | ORAL | 0 refills | Status: AC | PRN

## 2018-01-01 MED ORDER — FERROUS SULFATE 325 (65 FE) MG PO TABS: 325.0000 mg | ORAL_TABLET | Freq: Two times a day (BID) | ORAL | 2 refills | Status: DC

## 2018-01-01 NOTE — Progress Notes (Signed)
Pt declined flu vaccine.  Rubella immune

## 2018-01-01 NOTE — Discharge Summary (Addendum)
OB Discharge Summary     Patient Name: [name] DOB: Mar 31, 1995 MRN: [id number]  Date of admission: 12/30/2017 Delivering MD: Hildred Laser   Date of discharge: 01/01/2018  Admitting diagnosis: PRIOR C SECTION Intrauterine pregnancy: [redacted]w[redacted]d     Secondary diagnosis:  Active Problems:   Depressive disorder   Obesity (BMI 30.0-34.9)   History of C-section   Anemia of pregnancy in third trimester  Additional problems: None     Discharge diagnosis: Term Pregnancy Delivered and Anemia. Obesity and depressivie disorder                                                                                              Post partum procedures:None  Augmentation: None'  Complications: None  Hospital course:  Sceduled C/S   22 y.o. yo [id number] at [redacted]w[redacted]d was admitted to the hospital 12/30/2017 for scheduled cesarean section with the following indication:Elective Repeat.  Membrane Rupture Time/Date: 8:06 AM ,12/30/2017   Patient delivered a Viable infant.12/30/2017  Details of operation can be found in separate operative note.  Pateint had an uncomplicated postpartum course.  She is ambulating, tolerating a regular diet, passing flatus, and urinating well. Patient is discharged home in stable condition on  01/01/18         Physical exam  Vitals:   12/31/17 1557 12/31/17 2006 12/31/17 2334 01/01/18 0822  BP: 126/73 125/82 126/77 119/61  Pulse: (!) 110 98 84 74  Resp: 18 20 20 20   Temp: (!) 97.3 F (36.3 C) 97.8 F (36.6 C) (!) 97.4 F (36.3 C) 97.7 F (36.5 C)  TempSrc: Oral Oral Oral Oral  SpO2: 100% 100% 100% 99%  Weight:      Height:       General: alert and no distress Lochia: appropriate Uterine Fundus: firm Incision: Healing well with no significant drainage, No significant erythema DVT Evaluation: No evidence of DVT seen on physical exam. Negative Homan's sign. No cords or calf tenderness. Labs: Lab Results  Component Value Date   WBC 14.0 (H) 12/31/2017   HGB 8.6  (L) 12/31/2017   HCT 28.9 (L) 12/31/2017   MCV 75.9 (L) 12/31/2017   PLT 294 12/31/2017   CMP Latest Ref Rng & Units 06/30/2017  Glucose 65 - 99 mg/dL 68  BUN 6 - 20 mg/dL 8  Creatinine 1.47 - 8.29 mg/dL 5.62  Sodium 130 - 865 mmol/L 138  Potassium 3.5 - 5.1 mmol/L 4.4  Chloride 101 - 111 mmol/L 103  CO2 22 - 32 mmol/L 26  Calcium 8.9 - 10.3 mg/dL 7.8(I)  Total Protein 6.5 - 8.1 g/dL 7.0  Total Bilirubin 0.3 - 1.2 mg/dL 6.9(G)  Alkaline Phos 38 - 126 U/L 58  AST 15 - 41 U/L 20  ALT 14 - 54 U/L 26    Discharge instruction: per After Visit Summary and "Baby and Me Booklet".  After visit meds:  Allergies as of 01/01/2018      Reactions   Metformin And Related Shortness Of Breath   Sprintec 28 [norgestimate-eth Estradiol] Hives      Medication List    TAKE these medications  docusate sodium 100 MG capsule Commonly known as:  COLACE Take 1 capsule (100 mg total) by mouth 2 (two) times daily as needed.   ferrous sulfate 325 (65 FE) MG tablet Take 1 tablet (325 mg total) by mouth 2 (two) times daily with a meal.   ibuprofen 800 MG tablet Commonly known as:  ADVIL,MOTRIN Take 1 tablet (800 mg total) by mouth every 6 (six) hours.   multivitamin-prenatal 27-0.8 MG Tabs tablet Take 1 tablet by mouth daily at 12 noon.   ondansetron 4 MG tablet Commonly known as:  ZOFRAN TAKE 1 TABLET BY MOUTH EVERY 8 HOURS AS NEEDED FOR NAUSEA AND VOMITING   oxyCODONE-acetaminophen 5-325 MG tablet Commonly known as:  PERCOCET/ROXICET Take 1 tablet by mouth every 4 (four) hours as needed (pain scale 4-7).   pantoprazole 20 MG tablet Commonly known as:  PROTONIX Take 1 tablet (20 mg total) by mouth 2 (two) times daily before a meal.       Diet: routine diet  Activity: Advance as tolerated. Pelvic rest for 6 weeks.   Outpatient follow up:2 weeks Follow up Appt: Future Appointments  Date Time Provider Department Center  01/04/2018  9:45 AM Hildred Laser, MD [facility] None   Follow  up Visit:No follow-ups on file.  Postpartum contraception: Nexplanon  Newborn Data: Live born female  Birth Weight: 6 lb 11.2 oz (3040 g) APGAR: 8, 9  Newborn Delivery   Birth date/time:  12/30/2017 08:06:00 Delivery type:  C-Section, Low Transverse Trial of labor:  No C-section categorization:  Repeat     Baby Feeding: Bottle and Breast Disposition:home with mother   01/01/2018 Hildred Laser, MD  [facility]

## 2018-01-01 NOTE — Anesthesia Postprocedure Evaluation (Signed)
Anesthesia Post Note  Patient: [name]  Procedure(s) Performed: REPEAT CESAREAN SECTION (N/A )  Patient location during evaluation: Mother Baby Anesthesia Type: Spinal Pain management: pain level controlled Vital Signs Assessment: post-procedure vital signs reviewed and stable Respiratory status: spontaneous breathing Cardiovascular status: blood pressure returned to baseline Anesthetic complications: no Comments: Patient discharged prior to visit.  Patient not on floor on previous attempt to visit X 2.  No apparent anesthetic issues per staff     Last Vitals:  Vitals:   12/31/17 2334 01/01/18 0822  BP: 126/77 119/61  Pulse: 84 74  Resp: 20 20  Temp: (!) 36.3 C 36.5 C  SpO2: 100% 99%    Last Pain:  Vitals:   01/01/18 1057  TempSrc:   PainSc: 7                  Santia Labate

## 2018-01-01 NOTE — Anesthesia Post-op Follow-up Note (Signed)
  Anesthesia Pain Follow-up Note  Patient: [name]  Day #: 2  Date of Follow-up: 01/01/2018 Time: 10:24 PM  Last Vitals:  Vitals:   12/31/17 2334 01/01/18 0822  BP: 126/77 119/61  Pulse: 84 74  Resp: 20 20  Temp: (!) 36.3 C 36.5 C  SpO2: 100% 99%    Level of Consciousness: alert  Pain: none   Side Effects:None  Catheter Site Exam:clean     Plan: D/C from anesthesia care at surgeon's request. Patient discharged prior to discharge, but NOAC per staff with clear back.  Qianna Clagett

## 2018-01-02 ENCOUNTER — Encounter: Payer: Self-pay | Admitting: Obstetrics and Gynecology

## 2018-01-04 ENCOUNTER — Encounter: Payer: Medicaid Other | Admitting: Obstetrics and Gynecology

## 2018-01-13 ENCOUNTER — Encounter: Payer: Self-pay | Admitting: Obstetrics and Gynecology

## 2018-01-13 ENCOUNTER — Ambulatory Visit (INDEPENDENT_AMBULATORY_CARE_PROVIDER_SITE_OTHER): Payer: Medicaid Other | Admitting: Obstetrics and Gynecology

## 2018-01-13 VITALS — BP 120/75 | HR 89 | Ht 59.0 in | Wt 152.0 lb

## 2018-01-13 DIAGNOSIS — Z8659 Personal history of other mental and behavioral disorders: Secondary | ICD-10-CM

## 2018-01-13 DIAGNOSIS — Z3049 Encounter for surveillance of other contraceptives: Secondary | ICD-10-CM | POA: Diagnosis not present

## 2018-01-13 DIAGNOSIS — Z3202 Encounter for pregnancy test, result negative: Secondary | ICD-10-CM

## 2018-01-13 DIAGNOSIS — Z30017 Encounter for initial prescription of implantable subdermal contraceptive: Secondary | ICD-10-CM

## 2018-01-13 DIAGNOSIS — Z98891 History of uterine scar from previous surgery: Secondary | ICD-10-CM

## 2018-01-13 DIAGNOSIS — Z4889 Encounter for other specified surgical aftercare: Secondary | ICD-10-CM

## 2018-01-13 DIAGNOSIS — Z8759 Personal history of other complications of pregnancy, childbirth and the puerperium: Secondary | ICD-10-CM

## 2018-01-13 LAB — POCT URINE PREGNANCY: Preg Test, Ur: NEGATIVE

## 2018-01-13 NOTE — Progress Notes (Signed)
Pt is present today for incision check after having a c-section. Pt stated that she is doing well and her incisions are healing well. No other problems or concerns to report at this time.

## 2018-01-13 NOTE — Patient Instructions (Signed)
NEXPLANON PLACEMENT POST-PROCEDURE INSTRUCTIONS ° °1. You may take Ibuprofen, Aleve or Tylenol for pain if needed.  Pain should resolve within in 24 hours. ° °2. You may have intercourse after 24 hours.  If you using this for birth control, it is effective immediately. ° °3. You need to call if you have any fever, heavy bleeding, or redness at insertion site. Irregular bleeding is common the first several months after having a Nexplanonplaced. You do not need to call for this reason unless you are concerned. ° °4. Shower or bathe as normal.  You can remove the bandage after 24 hours. ° °

## 2018-01-13 NOTE — Progress Notes (Signed)
    OBSTETRICS/GYNECOLOGY POST-OPERATIVE CLINIC VISIT  Subjective:     [name] is a 22 y.o. female who presents to the clinic 2 weeks status post repeat C-section  Delivery was at [redacted] weeks gestation. Eating a regular diet without difficulty. Bowel movements are normal. Pain is controlled with current analgesics. Medications being used: prescription NSAID's including ibuprofen (Motrin).  Patient does have a history of postpartum depression, currently on no meds. EPDS score =  1.   The following portions of the patient's history were reviewed and updated as appropriate: allergies, current medications, past family history, past medical history, past social history, past surgical history and problem list.  Review of Systems Pertinent items noted in HPI and remainder of comprehensive ROS otherwise negative.    Objective:    BP 120/75   Pulse 89   Ht 4\' 11"  (1.499 m)   Wt 152 lb (68.9 kg)   LMP 03/29/2017   Breastfeeding? Yes   BMI 30.70 kg/m  General:  alert and no distress  Abdomen: soft, bowel sounds active, non-tender  Incision:   healing well, no drainage, no erythema, no hernia, no seroma, no swelling, no dehiscence, incision well approximated    Pathology:  None  Assessment:    Doing well postoperatively.  S/p repeat C-section  Contraception management H/o postpartum depression  Plan:   1. Continue any current medications. 2. Wound care discussed. 3. Contraception: Nexplanon to be placed today.  See below for insertion procedure note.  4. Activity restrictions: no bending, stooping, or squatting, no lifting more than 10-15 pounds and pelvic rest x 4 weeks 5. Anticipated return to work: 4 weeks if applicable. 6. H/o postpartum depression, no evidence of depression today, EPDS score is 1. Will f/u at final postpartum visit.  7. Follow up: 4 weeks for postpartum visit.     GYNECOLOGY OFFICE PROCEDURE NOTE  [name] is a 22 y.o. [id number] here for  Nexplanon insertion.  She is 2 weeks postpartum. Last pap smear was on 05/2017 and was normal.  No other gynecologic concerns.   Nexplanon Insertion Procedure Patient identified, informed consent performed, consent signed.   Patient does understand that irregular bleeding is a very common side effect of this medication. She was advised to have backup contraception for one week after placement. Pregnancy test in clinic today was negative.  Appropriate time out taken.  Patient's left arm was prepped and draped in the usual sterile fashion. The ruler used to measure and mark insertion area.  Patient was prepped with alcohol swab and then injected with 3 ml of 1% lidocaine.  She was prepped with betadine, Nexplanon removed from packaging,  Device confirmed in needle, then inserted full length of needle and withdrawn per handbook instructions. Nexplanon was able to palpated in the patient's arm; patient palpated the insert herself. There was minimal blood loss.  Patient insertion site covered with guaze and a pressure bandage to reduce any bruising.  The patient tolerated the procedure well and was given post procedure instructions.    Exp: 2/25//2022 Lot: [id number]   Hildred Laserherry, Madden Piazza, MD [facility]

## 2018-01-30 ENCOUNTER — Other Ambulatory Visit: Payer: Medicaid Other

## 2018-01-30 ENCOUNTER — Other Ambulatory Visit: Payer: Self-pay | Admitting: Obstetrics and Gynecology

## 2018-01-30 DIAGNOSIS — Z111 Encounter for screening for respiratory tuberculosis: Secondary | ICD-10-CM

## 2018-02-02 LAB — QUANTIFERON-TB GOLD PLUS
QuantiFERON Mitogen Value: >10 IU/mL
QuantiFERON Nil Value: 0.04 IU/mL
QuantiFERON TB1 Ag Value: 0.06 IU/mL
QuantiFERON TB2 Ag Value: 0.03 IU/mL
QuantiFERON-TB Gold Plus: NEGATIVE

## 2018-02-07 ENCOUNTER — Telehealth: Payer: Self-pay

## 2018-02-07 NOTE — Telephone Encounter (Signed)
Pt called and informed that the paper work had been printed and upfront for pick up. Pt stated that she needed a doctor's signature on the paper. [facility] and would not return until Friday. Pt stated that she would come by and pick the paper anyway without the doctor's signature and if they need it she would bring it back on Friday at her [facility] appointment and [facility] and sign it then.

## 2018-02-07 NOTE — Telephone Encounter (Signed)
Pt needs TB results for her job. She would like a copy printed and let up front for p/u.  Pls advise.

## 2018-02-10 ENCOUNTER — Ambulatory Visit (INDEPENDENT_AMBULATORY_CARE_PROVIDER_SITE_OTHER): Payer: Medicaid Other | Admitting: Obstetrics and Gynecology

## 2018-02-10 ENCOUNTER — Encounter: Payer: Self-pay | Admitting: Obstetrics and Gynecology

## 2018-02-10 DIAGNOSIS — Z975 Presence of (intrauterine) contraceptive device: Secondary | ICD-10-CM

## 2018-02-10 DIAGNOSIS — Z1389 Encounter for screening for other disorder: Secondary | ICD-10-CM

## 2018-02-10 DIAGNOSIS — F419 Anxiety disorder, unspecified: Secondary | ICD-10-CM

## 2018-02-10 DIAGNOSIS — F329 Major depressive disorder, single episode, unspecified: Secondary | ICD-10-CM

## 2018-02-10 DIAGNOSIS — O9081 Anemia of the puerperium: Secondary | ICD-10-CM

## 2018-02-10 MED ORDER — ESCITALOPRAM OXALATE 10 MG PO TABS: 10.0000 mg | ORAL_TABLET | Freq: Every day | ORAL | 3 refills | Status: DC

## 2018-02-10 NOTE — Progress Notes (Signed)
PT is present today for her postpartum visit. Pt stated that she is breastfeeding and have had sexually intercourse once. Pt already got Nexplanon for birth control. EPDS=10.  Pt stated that the baby didn't sleep for 2 nights and she noticed chest pains that would not ease from acid reflex medication. Pt thinks she is having anxiety attacks. GAD-7=12.

## 2018-02-10 NOTE — Progress Notes (Signed)
   OBSTETRICS POSTPARTUM CLINIC PROGRESS NOTE  Subjective:     [name] is a 22 y.o. [phone] female who presents for a postpartum visit. She is 6 weeks postpartum following a repeat Cesarean section. I have fully reviewed the prenatal and intrapartum course. The delivery was at 39 gestational weeks.  Anesthesia: spinal. Postpartum course has been well. Baby's course has been well. Baby is feeding by breast. Bleeding: patient has not resumed menses. Bowel function is normal. Bladder function is normal. Patient is sexually active. Contraception method desired is Nexplanon (placed at 2 week postpartum visit). Postpartum depression screening: positive, EPDS score is 10.    The following portions of the patient's history were reviewed and updated as appropriate: allergies, current medications, past family history, past medical history, past social history, past surgical history and problem list.   Review of Systems A comprehensive review of systems was negative except for: patient notes several days where she was noting some chest pain in the center of her chest.  Thought it was reflux but her reflux meds did not help.  She does note that she was also very stressed during that time as the baby was not sleeping and crying more frequently. Wonders if it may be her anxiety.   Objective:    BP 110/68   Pulse 85   Ht 4\' 11"  (1.499 m)   Wt 148 lb 3.2 oz (67.2 kg)   LMP 03/29/2017   Breastfeeding Yes   BMI 29.93 kg/m   General:  alert and no distress   Breasts:  inspection negative, no nipple discharge or bleeding, no masses or nodularity palpable  Lungs: clear to auscultation bilaterally  Heart:  regular rate and rhythm, S1, S2 normal, no murmur, click, rub or gallop  Abdomen: soft, non-tender; bowel sounds normal; no masses,  no organomegaly.  Well healed Pfannenstiel incision   Vulva:  normal  Vagina: normal vagina, no discharge, exudate, lesion, or erythema  Cervix:  no cervical motion  tenderness and no lesions  Corpus: normal size, contour, position, consistency, mobility, non-tender  Adnexa:  normal adnexa and no mass, fullness, tenderness  Rectal Exam: Not performed.         Labs:  Lab Results  Component Value Date   HGB 8.6 (L) 12/31/2017     Assessment:    Routine postpartum exam.   History of anxiety and depression Postpartum anemia   Plan:    1. Contraception: Nexplanon (inserted at last visit ~ 4 weeks ago).  2. Will check Hgb for h/o anemia.  3. Discussion had on symptoms of anxiety, also with screen positive for depression.  GAD score is 12, EPDS score is 10. Is not interested in counseling at this time.  Will prescribe Lexapro (had previously been on Zoloft, but did not feel that it was working so discontinued early during pregnancy).   4. Follow up in: 3 weeks to f/u with depression and anxiety.    [name], Stephanie Cwynar, MD [facility]

## 2018-02-11 LAB — HEMOGLOBIN AND HEMATOCRIT, BLOOD
Hematocrit: 36.6 % (ref 34.0–46.6)
Hemoglobin: 11.3 g/dL (ref 11.1–15.9)

## 2018-03-02 ENCOUNTER — Encounter: Payer: Self-pay | Admitting: Obstetrics and Gynecology

## 2018-03-02 ENCOUNTER — Ambulatory Visit (INDEPENDENT_AMBULATORY_CARE_PROVIDER_SITE_OTHER): Payer: Medicaid Other | Admitting: Obstetrics and Gynecology

## 2018-03-02 VITALS — BP 117/83 | HR 93 | Ht 59.0 in | Wt 147.5 lb

## 2018-03-02 DIAGNOSIS — F419 Anxiety disorder, unspecified: Secondary | ICD-10-CM | POA: Diagnosis not present

## 2018-03-02 DIAGNOSIS — F329 Major depressive disorder, single episode, unspecified: Secondary | ICD-10-CM

## 2018-03-02 MED ORDER — PAROXETINE HCL 10 MG PO TABS: 10.0000 mg | ORAL_TABLET | Freq: Every day | ORAL | 3 refills | Status: DC

## 2018-03-02 NOTE — Progress Notes (Signed)
    GYNECOLOGY PROGRESS NOTE  Subjective:    Patient ID: [name], [name]    DOB: [birth year], 89 y.o.   MRN: [id number]  HPI  Patient is a 23 y.o. [phone] [name] who presents for f/u of treatment for anxiety and depression (although anxiety is the more bothersome component, feels like she has good coping skills with the depression). Is 2 months postpartum. Notes that she was having severe headaches after starting the Lexapro, so discontinued after only 3 days of use.    The following portions of the patient's history were reviewed and updated as appropriate: allergies, current medications, past family history, past medical history, past social history, past surgical history and problem list.  Review of Systems Pertinent items noted in HPI and remainder of comprehensive ROS otherwise negative.   Objective:   Blood pressure 117/83, pulse 93, height 4\' 11"  (1.499 m), weight 147 lb 8 oz (66.9 kg), last menstrual period 03/29/2017, not currently breastfeeding. General appearance: alert and no distress Remainder of exam deferred.    Assessment:   Anxiety and depression  Plan:   Will change from Lexapro to Paxil.  Start at 10 mg dosing. To f/u in 3 weeks to reassess symptoms.    Hildred Laserherry, Dawan Farney, MD [facility]

## 2018-03-02 NOTE — Progress Notes (Signed)
Pt stated that she stopped taking the Lexapro 10 mg due to headaches. No other complaints.

## 2018-03-22 ENCOUNTER — Encounter: Payer: Medicaid Other | Admitting: Obstetrics and Gynecology

## 2018-03-27 ENCOUNTER — Other Ambulatory Visit: Payer: Self-pay

## 2018-03-27 ENCOUNTER — Encounter: Payer: Self-pay | Admitting: Emergency Medicine

## 2018-03-27 ENCOUNTER — Emergency Department
Admission: EM | Admit: 2018-03-27 | Discharge: 2018-03-27 | Disposition: A | Discharge status: Home / Self Care | Payer: Medicaid Other | Attending: Emergency Medicine | Admitting: Emergency Medicine

## 2018-03-27 DIAGNOSIS — F17210 Nicotine dependence, cigarettes, uncomplicated: Secondary | ICD-10-CM | POA: Diagnosis not present

## 2018-03-27 DIAGNOSIS — I10 Essential (primary) hypertension: Secondary | ICD-10-CM | POA: Insufficient documentation

## 2018-03-27 DIAGNOSIS — R7303 Prediabetes: Secondary | ICD-10-CM | POA: Insufficient documentation

## 2018-03-27 DIAGNOSIS — R1013 Epigastric pain: Secondary | ICD-10-CM | POA: Diagnosis present

## 2018-03-27 DIAGNOSIS — K2970 Gastritis, unspecified, without bleeding: Secondary | ICD-10-CM | POA: Insufficient documentation

## 2018-03-27 DIAGNOSIS — J45909 Unspecified asthma, uncomplicated: Secondary | ICD-10-CM | POA: Diagnosis not present

## 2018-03-27 DIAGNOSIS — Z79899 Other long term (current) drug therapy: Secondary | ICD-10-CM | POA: Diagnosis not present

## 2018-03-27 LAB — COMPREHENSIVE METABOLIC PANEL
ALK PHOS: 69 U/L (ref 38–126)
ALT: 34 U/L (ref 0–44)
AST: 23 U/L (ref 15–41)
Albumin: 4.6 g/dL (ref 3.5–5.0)
Anion gap: 5 (ref 5–15)
BUN: 13 mg/dL (ref 6–20)
CALCIUM: 9.4 mg/dL (ref 8.9–10.3)
CO2: 28 mmol/L (ref 22–32)
Chloride: 108 mmol/L (ref 98–111)
Creatinine, Ser: 0.81 mg/dL (ref 0.44–1.00)
GFR calc non Af Amer: >60 mL/min (ref >60)
GLUCOSE: 89 mg/dL (ref 70–99)
Potassium: 3.8 mmol/L (ref 3.5–5.1)
SODIUM: 141 mmol/L (ref 135–145)
Total Bilirubin: 0.4 mg/dL (ref 0.3–1.2)
Total Protein: 7.8 g/dL (ref 6.5–8.1)

## 2018-03-27 LAB — CBC
HCT: 40.5 % (ref 36.0–46.0)
Hemoglobin: 12.7 g/dL (ref 12.0–15.0)
MCH: 25.3 pg — AB (ref 26.0–34.0)
MCHC: 31.4 g/dL (ref 30.0–36.0)
MCV: 80.7 fL (ref 80.0–100.0)
PLATELETS: 240 10*3/uL (ref 150–400)
RBC: 5.02 MIL/uL (ref 3.87–5.11)
RDW: 18.8 % — AB (ref 11.5–15.5)
WBC: 7.2 10*3/uL (ref 4.0–10.5)
nRBC: 0 % (ref 0.0–0.2)

## 2018-03-27 LAB — LIPASE, BLOOD: Lipase: 32 U/L (ref 11–51)

## 2018-03-27 LAB — TROPONIN I: Troponin I: <0.03 ng/mL (ref <0.03)

## 2018-03-27 MED ORDER — SUCRALFATE 1 G PO TABS: 1.0000 g | ORAL_TABLET | Freq: Three times a day (TID) | ORAL | 1 refills | Status: DC

## 2018-03-27 MED ORDER — ALUM & MAG HYDROXIDE-SIMETH 200-200-20 MG/5ML PO SUSP
30.0000 mL | Freq: Once | ORAL | Status: AC
  Administered 2018-03-27: 30 mL via ORAL
  Filled 2018-03-27: qty 30

## 2018-03-27 MED ORDER — PANTOPRAZOLE SODIUM 20 MG PO TBEC: 20.0000 mg | DELAYED_RELEASE_TABLET | Freq: Two times a day (BID) | ORAL | 1 refills | Status: DC

## 2018-03-27 MED ORDER — SODIUM CHLORIDE 0.9% FLUSH: 3.0000 mL | Freq: Once | INTRAVENOUS | Status: DC

## 2018-03-27 MED ORDER — LIDOCAINE VISCOUS HCL 2 % MT SOLN
15.0000 mL | Freq: Once | OROMUCOSAL | Status: AC
  Administered 2018-03-27: 15 mL via ORAL
  Filled 2018-03-27: qty 15

## 2018-03-27 NOTE — ED Triage Notes (Signed)
Epigastric pain x 2 weeks. intermittent nausea.

## 2018-03-27 NOTE — ED Provider Notes (Signed)
[facility] Emergency Department Provider Note   ____________________________________________    I have reviewed the triage vital signs and the nursing notes.   HISTORY  Chief Complaint Abdominal Pain     HPI [name] is a 23 y.o. female who presents with complaints of epigastric discomfort which she states is been ongoing since November.  She states it started after the birth of her child.  She states it feels like acid reflux.  She reports she has taken some over-the-counter acid reflux medications here in the ER with little success.  Denies nausea or vomiting.  No chest pain or shortness of breath.  Symptoms occur frequently but not daily  Past Medical History:  Diagnosis Date  . Abdominal pain   . Asthma   . History of hypertension   . Hypertension   . Nausea   . Peptic ulcer disease   . PONV (postoperative nausea and vomiting)   . Pre-diabetes (history)     Patient Active Problem List   Diagnosis Date Noted  . Increased BMI 05/11/2017  . Drug use 05/11/2017  . Tobacco user 01/11/2017  . History of C-section 11/29/2016  . Acquired acanthosis nigricans 11/11/2010  . Depressive disorder 07/19/2010  . Obesity (BMI 30.0-34.9) 07/19/2010  . Extrinsic asthma 04/03/2009    Past Surgical History:  Procedure Laterality Date  . CESAREAN SECTION N/A 11/29/2016   Procedure: CESAREAN SECTION;  Surgeon: Herold Harmsefrancesco, Martin A, MD;  Location: [facility] ORS;  Service: Obstetrics;  Laterality: N/A;  . CESAREAN SECTION N/A 12/30/2017   Procedure: REPEAT CESAREAN SECTION;  Surgeon: Hildred Laserherry, Anika, MD;  Location: [facility] ORS;  Service: Obstetrics;  Laterality: N/A;  . TONSILLECTOMY    . WISDOM TOOTH EXTRACTION      Prior to Admission medications   Medication Sig Start Date End Date Taking? Authorizing Provider  etonogestrel (NEXPLANON) 68 MG IMPL implant 1 each by Subdermal route once.    [provider]  pantoprazole (PROTONIX) 20 MG tablet  Take 1 tablet (20 mg total) by mouth 2 (two) times daily. 03/27/18 03/27/19  Jene EveryKinner, Golden Emile, MD  PARoxetine (PAXIL) 10 MG tablet Take 1 tablet (10 mg total) by mouth daily. 03/02/18   Hildred Laserherry, Anika, MD  sucralfate (CARAFATE) 1 g tablet Take 1 tablet (1 g total) by mouth 4 (four) times daily -  with meals and at bedtime. 03/27/18 03/27/19  Jene EveryKinner, Sandon Yoho, MD     Allergies Metformin and related and Sprintec 28 [norgestimate-eth estradiol]  Family History  Problem Relation Age of Onset  . Cholelithiasis Mother   . SIDS Brother   . Cholelithiasis Maternal Grandmother   . Ulcers Maternal Grandmother   . Diabetes Maternal Grandfather   . Celiac disease Neg Hx     Social History Social History   Tobacco Use  . Smoking status: Current Every Day Smoker    Packs/day: 0.25    Types: Cigarettes    Last attempt to quit: 03/24/2016    Years since quitting: 2.0  . Smokeless tobacco: Never Used  Substance Use Topics  . Alcohol use: No  . Drug use: Not Currently    Comment: Denies current use     Review of Systems  Constitutional: No fever/chills Eyes: No visual changes.  ENT: No sore throat. Cardiovascular: Denies chest pain. Respiratory: Denies shortness of breath. Gastrointestinal: As above Genitourinary: Negative for dysuria. Musculoskeletal: Negative for back pain. Skin: Negative for rash. Neurological: Negative for headaches    ____________________________________________   PHYSICAL EXAM:  VITAL SIGNS: ED Triage Vitals [03/27/18 1644]  Enc Vitals Group     BP 134/88     Pulse Rate 100     Resp 20     Temp 98.3 F (36.8 C)     Temp Source Oral     SpO2 100 %     Weight 65.8 kg (145 lb)     Height 1.499 m (4\' 11" )     Head Circumference      Peak Flow      Pain Score 5     Pain Loc      Pain Edu?      Excl. in GC?     Constitutional: Alert and oriented. No acute distress.   Nose: No congestion/rhinnorhea. Mouth/Throat: Mucous membranes are moist.     Cardiovascular: Normal rate, regular rhythm. Grossly normal heart sounds.  Good peripheral circulation. Respiratory: Normal respiratory effort.  No retractions. Lungs CTAB. Gastrointestinal: Soft and nontender. No distention.  No CVA tenderness.  Reassuring exam  Musculoskeletal:   Warm and well perfused Neurologic:  Normal speech and language. No gross focal neurologic deficits are appreciated.  Skin:  Skin is warm, dry and intact. No rash noted. Psychiatric: Mood and affect are normal. Speech and behavior are normal.  ____________________________________________   LABS (all labs ordered are listed, but only abnormal results are displayed)  Labs Reviewed  CBC - Abnormal; Notable for the following components:      Result Value   MCH 25.3 (*)    RDW 18.8 (*)    All other components within normal limits  LIPASE, BLOOD  COMPREHENSIVE METABOLIC PANEL  TROPONIN I   ____________________________________________  EKG  ED ECG REPORT I, Jene Every, the attending physician, personally viewed and interpreted this ECG.  Date: 03/27/2018  Rhythm: normal sinus rhythm QRS Axis: normal Intervals: normal ST/T Wave abnormalities: normal Narrative Interpretation: no evidence of acute ischemia  ____________________________________________  RADIOLOGY  None ____________________________________________   PROCEDURES  Procedure(s) performed: No  Procedures   Critical Care performed: No ____________________________________________   INITIAL IMPRESSION / ASSESSMENT AND PLAN / ED COURSE  Pertinent labs & imaging results that were available during my care of the patient were reviewed by me and considered in my medical decision making (see chart for details).  Patient well-appearing and in no acute distress.  Essentially chronic epigastric pain suspicious for GERD/PUD, no tenderness over the gallbladder.  We will try twice daily Protonix with Carafate and GI follow-up if no  improvement    ____________________________________________   FINAL CLINICAL IMPRESSION(S) / ED DIAGNOSES  Final diagnoses:  Gastritis without bleeding, unspecified chronicity, unspecified gastritis type        Note:  This document was prepared using Dragon voice recognition software and may include unintentional dictation errors.   Jene Every, MD 03/27/18 1736

## 2018-03-29 ENCOUNTER — Encounter: Payer: Medicaid Other | Admitting: Obstetrics and Gynecology

## 2018-04-11 ENCOUNTER — Other Ambulatory Visit: Payer: Self-pay

## 2018-04-12 ENCOUNTER — Ambulatory Visit (INDEPENDENT_AMBULATORY_CARE_PROVIDER_SITE_OTHER): Payer: Self-pay | Admitting: Gastroenterology

## 2018-04-12 ENCOUNTER — Encounter

## 2018-04-12 ENCOUNTER — Encounter: Payer: Self-pay | Admitting: Gastroenterology

## 2018-04-12 VITALS — BP 123/73 | HR 83 | Ht 59.0 in | Wt 149.0 lb

## 2018-04-12 DIAGNOSIS — M7918 Myalgia, other site: Secondary | ICD-10-CM

## 2018-04-12 MED ORDER — CYCLOBENZAPRINE HCL 10 MG PO TABS: 10.0000 mg | ORAL_TABLET | Freq: Three times a day (TID) | ORAL | 1 refills | Status: DC | PRN

## 2018-04-13 NOTE — Progress Notes (Signed)
Gastroenterology Consultation  Referring Provider:     Center, Darcella Gasman* Primary Care Physician:  Center, [facility] Primary Gastroenterologist:  Dr. Servando Snare     Reason for Consultation:     Abdominal pain        HPI:   [name] is a 23 y.o. y/o female referred for consultation & management of Abdominal pain by Dr. Eli Phillips, [facility].  This patient comes in today with a report of abdominal pain.  The patient states that it is a bandlike pain that is just under her ribs on both sides.She reports that the pain started right after she gave birth to her child a few months ago.The patient did have right upper quadrant pain back in 2016 with a normal right upper quadrant ultrasound. Although the pain is related to eating with the pain being worse after she eats she has no other GI symptoms.  She states that she has had both weight gain and weight loss without any real change in her baseline weight.  She also reports that she feels better when she bends over and feels worse when she is laying flat on her back.The patient was in the emergency room on January 27 of this year and had reported that he felt like acid reflux but she has been taking multiple over-the-counter medications without any relief of her symptoms.  Past Medical History:  Diagnosis Date  . Abdominal pain   . Asthma   . History of hypertension   . Hypertension   . Nausea   . Peptic ulcer disease   . PONV (postoperative nausea and vomiting)   . Pre-diabetes (history)     Past Surgical History:  Procedure Laterality Date  . CESAREAN SECTION N/A 11/29/2016   Procedure: CESAREAN SECTION;  Surgeon: Herold Harms, MD;  Location: [facility] ORS;  Service: Obstetrics;  Laterality: N/A;  . CESAREAN SECTION N/A 12/30/2017   Procedure: REPEAT CESAREAN SECTION;  Surgeon: Hildred Laser, MD;  Location: [facility] ORS;  Service: Obstetrics;  Laterality: N/A;  . TONSILLECTOMY    . WISDOM TOOTH  EXTRACTION      Prior to Admission medications   Medication Sig Start Date End Date Taking? Authorizing Provider  etonogestrel (NEXPLANON) 68 MG IMPL implant 1 each by Subdermal route once.   Yes [provider]  acyclovir (ZOVIRAX) 200 MG capsule TAKE 2 CAPSULES BY MOUTH EVERY 8 HOURS FOR 7 DAYS 01/03/18   [provider]  cyclobenzaprine (FLEXERIL) 10 MG tablet Take 1 tablet (10 mg total) by mouth 3 (three) times daily as needed for muscle spasms. 04/12/18   Midge Minium, MD  pantoprazole (PROTONIX) 20 MG tablet Take 1 tablet (20 mg total) by mouth 2 (two) times daily. Patient not taking: Reported on 04/12/2018 03/27/18 03/27/19  Jene Every, MD  PARoxetine (PAXIL) 10 MG tablet Take 1 tablet (10 mg total) by mouth daily. Patient not taking: Reported on 04/12/2018 03/02/18   Hildred Laser, MD  sucralfate (CARAFATE) 1 g tablet Take 1 tablet (1 g total) by mouth 4 (four) times daily -  with meals and at bedtime. Patient not taking: Reported on 04/12/2018 03/27/18 03/27/19  Jene Every, MD    Family History  Problem Relation Age of Onset  . Cholelithiasis Mother   . SIDS Brother   . Cholelithiasis Maternal Grandmother   . Ulcers Maternal Grandmother   . Diabetes Maternal Grandfather   . Celiac disease Neg Hx      Social  History   Tobacco Use  . Smoking status: Current Every Day Smoker    Packs/day: 0.25    Types: Cigarettes    Last attempt to quit: 03/24/2016    Years since quitting: 2.0  . Smokeless tobacco: Never Used  Substance Use Topics  . Alcohol use: No  . Drug use: Not Currently    Comment: Denies current use     Allergies as of 04/12/2018 - Review Complete 04/12/2018  Allergen Reaction Noted  . Metformin and related Shortness Of Breath 07/14/2011  . Sprintec 28 [norgestimate-eth estradiol] Hives 05/28/2013    Review of Systems:    All systems reviewed and negative except where noted in HPI.   Physical Exam:  BP 123/73   Pulse 83   Ht 4\' 11"   (1.499 m)   Wt 149 lb (67.6 kg)   BMI 30.09 kg/m  No LMP recorded. Patient has had an implant. General:   Alert,  Well-developed, well-nourished, pleasant and cooperative in NAD Head:  Normocephalic and atraumatic. Eyes:  Sclera clear, no icterus.   Conjunctiva pink. Ears:  Normal auditory acuity. Nose:  No deformity, discharge, or lesions. Mouth:  No deformity or lesions,oropharynx pink & moist. Neck:  Supple; no masses or thyromegaly. Lungs:  Respirations even and unlabored.  Clear throughout to auscultation.   No wheezes, crackles, or rhonchi. No acute distress. Heart:  Regular rate and rhythm; no murmurs, clicks, rubs, or gallops. Abdomen:  Normal bowel sounds.  No bruits.  Soft, Positive tenderness to one finger palpation while flexing the abdominal wall by raising the patient's legs 6 inches above the exam table and non-distended without masses, hepatosplenomegaly or hernias noted.  No guarding or rebound tenderness.  Negative Carnett sign.   Rectal:  Deferred.  Msk:  Symmetrical without gross deformities.  Good, equal movement & strength bilaterally. Pulses:  Normal pulses noted. Extremities:  No clubbing or edema.  No cyanosis. Neurologic:  Alert and oriented x3;  grossly normal neurologically. Skin:  Intact without significant lesions or rashes.  No jaundice. Lymph Nodes:  No significant cervical adenopathy. Psych:  Alert and cooperative. Normal mood and affect.  Imaging Studies: No results found.  Assessment and Plan:   [name] is a 23 y.o. y/o female who comes in with abdominal wall pain that started after she was pregnant.  The patient had a C-section for both her pregnancies. The patient has no change in bowel habits such as constipation or diarrhea although she states that she usually has had chronically loose bowel movements but this is not changed.  The patient will be started on anti-inflammatory medication and Flexeril.  She has been told not to drive or operate  machinery on Flexeril.  She also endorses back pain which is been chronic for her and is seen more commonly in patients who have abdominal wall musculoskeletal pain also.  The patient has been told the plan and explains the pathophysiology of muscle pain and chronicity and she states she understands.  Midge Miniumarren Ebony Yorio, MD. Clementeen GrahamFACG    Note: This dictation was prepared with Dragon dictation along with smaller phrase technology. Any transcriptional errors that result from this process are unintentional.

## 2018-05-04 ENCOUNTER — Ambulatory Visit: Payer: Medicaid Other | Admitting: Gastroenterology

## 2018-07-17 ENCOUNTER — Encounter: Payer: Self-pay | Admitting: Gastroenterology

## 2018-07-17 ENCOUNTER — Other Ambulatory Visit: Payer: Self-pay

## 2018-07-17 ENCOUNTER — Ambulatory Visit (INDEPENDENT_AMBULATORY_CARE_PROVIDER_SITE_OTHER): Payer: Medicaid Other | Admitting: Gastroenterology

## 2018-07-17 ENCOUNTER — Telehealth: Payer: Self-pay

## 2018-07-17 DIAGNOSIS — R1011 Right upper quadrant pain: Secondary | ICD-10-CM | POA: Diagnosis not present

## 2018-07-17 NOTE — Telephone Encounter (Signed)
-----   Message from Rayann Heman, CMA sent at 07/17/2018  9:57 AM EDT ----- Call to schedule RUQ US/HIDA scan. Pt prefers Mondays or Wednesday. 07/17/18 Virtual visit

## 2018-07-17 NOTE — Telephone Encounter (Signed)
Pt has been scheduled for RUQ abdominal US/HIDA scan at [facility] on Wednesday, June 3rd at 9:00am. Pt has been advised to arrive at the medical mall registration desk and to be NPO after midnight.

## 2018-07-17 NOTE — Progress Notes (Signed)
Stephanie Miniumarren Aamya Orellana, MD [address]  [address]  [place], [place] [address]  Main: [phone]  Fax: [phone]    Gastroenterology Virtual/Video Visit  Referring Provider:     Center, [facility]* Primary Care Physician:  [facility], [facility] Primary Gastroenterologist:  Dr.Dreyton Roessner Servando SnareWohl Reason for Consultation:     Abdominal pain        HPI:    Virtual Visit via Video Note Location of the patient: Work Location of provider: Office  Participating persons: The patient myself and [name].  I connected with [name] on 07/17/18 at 10:00 AM EDT by a video enabled telemedicine application and verified that I am speaking with the correct person using two identifiers.   I discussed the limitations of evaluation and management by telemedicine and the availability of in person appointments. The patient expressed understanding and agreed to proceed.  Verbal consent to proceed obtained.  History of Present Illness: [name] is a 23 y.o. female referred by Dr. Eli Phillipsenter, [facility]  for consultation & management of abdominal pain.  This patient was seen by me in February in the office due to abdominal pain.  At that time the patient was reporting the abdominal pain to be bandlike and had started shortly after she had given birth to her child.  At that time the patient had reported the pain to be on both sides of her abdomen just under her ribs.  She also had reported that the pain has been more prominent after she eats but had denied any other GI symptoms such as change in bowel habits.  Back in February the patient also reported the abdominal pain to be worse when she bends over or when she laid down flat on her back.  At that time the patient's abdominal exam was consistent with reproducible muscle wall pain and the patient was started on Flexeril. The patient reports that her abdominal pain is worse when she is walking around but she  can have it with eating anything.  She states that she feels like food is getting stuck in her right upper quadrant.  There is no report of any unexplained weight loss black stools or bloody stools.  There is also no report of any hematemesis.  Past Medical History:  Diagnosis Date  . Abdominal pain   . Asthma   . History of hypertension   . Hypertension   . Nausea   . Peptic ulcer disease   . PONV (postoperative nausea and vomiting)   . Pre-diabetes (history)     Past Surgical History:  Procedure Laterality Date  . CESAREAN SECTION N/A 11/29/2016   Procedure: CESAREAN SECTION;  Surgeon: Herold Harmsefrancesco, Martin A, MD;  Location: [facility] ORS;  Service: Obstetrics;  Laterality: N/A;  . CESAREAN SECTION N/A 12/30/2017   Procedure: REPEAT CESAREAN SECTION;  Surgeon: Hildred Laserherry, Anika, MD;  Location: [facility] ORS;  Service: Obstetrics;  Laterality: N/A;  . TONSILLECTOMY    . WISDOM TOOTH EXTRACTION      Prior to Admission medications   Medication Sig Start Date End Date Taking? Authorizing Provider  acyclovir (ZOVIRAX) 200 MG capsule TAKE 2 CAPSULES BY MOUTH EVERY 8 HOURS FOR 7 DAYS 01/03/18   [provider]  cyclobenzaprine (FLEXERIL) 10 MG tablet Take 1 tablet (10 mg total) by mouth 3 (three) times daily as needed for muscle spasms. 04/12/18   Stephanie MiniumWohl, Chyna Kneece, MD  etonogestrel (NEXPLANON) 68 MG IMPL implant 1 each by Subdermal route once.  [provider]  pantoprazole (PROTONIX) 20 MG tablet Take 1 tablet (20 mg total) by mouth 2 (two) times daily. Patient not taking: Reported on 04/12/2018 03/27/18 03/27/19  Jene Every, MD  PARoxetine (PAXIL) 10 MG tablet Take 1 tablet (10 mg total) by mouth daily. Patient not taking: Reported on 04/12/2018 03/02/18   Hildred Laser, MD  sucralfate (CARAFATE) 1 g tablet Take 1 tablet (1 g total) by mouth 4 (four) times daily -  with meals and at bedtime. Patient not taking: Reported on 04/12/2018 03/27/18 03/27/19  Jene Every, MD    Family History   Problem Relation Age of Onset  . Cholelithiasis Mother   . SIDS Brother   . Cholelithiasis Maternal Grandmother   . Ulcers Maternal Grandmother   . Diabetes Maternal Grandfather   . Celiac disease Neg Hx      Social History   Tobacco Use  . Smoking status: Current Every Day Smoker    Packs/day: 0.25    Types: Cigarettes    Last attempt to quit: 03/24/2016    Years since quitting: 2.3  . Smokeless tobacco: Never Used  Substance Use Topics  . Alcohol use: No  . Drug use: Not Currently    Comment: Denies current use     Allergies as of 07/17/2018 - Review Complete 04/12/2018  Allergen Reaction Noted  . Metformin and related Shortness Of Breath 07/14/2011  . Sprintec 28 [norgestimate-eth estradiol] Hives 05/28/2013    Review of Systems:    All systems reviewed and negative except where noted in HPI.   Observations/Objective:  Labs: CBC    Component Value Date/Time   WBC 7.2 03/27/2018 1649   RBC 5.02 03/27/2018 1649   HGB 12.7 03/27/2018 1649   HGB 11.3 02/10/2018 1131   HCT 40.5 03/27/2018 1649   HCT 36.6 02/10/2018 1131   PLT 240 03/27/2018 1649   PLT 285 10/12/2017 0930   MCV 80.7 03/27/2018 1649   MCV 82 10/12/2017 0930   MCV 88 10/17/2013 2333   MCH 25.3 (L) 03/27/2018 1649   MCHC 31.4 03/27/2018 1649   RDW 18.8 (H) 03/27/2018 1649   RDW 14.5 10/12/2017 0930   RDW 13.6 10/17/2013 2333   LYMPHSABS 2.6 05/31/2017 1513   LYMPHSABS 2.4 05/16/2013 2240   MONOABS 0.4 02/02/2016 1805   MONOABS 0.6 05/16/2013 2240   EOSABS 0.1 05/31/2017 1513   EOSABS 0.2 05/16/2013 2240   BASOSABS 0.0 05/31/2017 1513   BASOSABS 0.0 05/16/2013 2240   CMP     Component Value Date/Time   NA 141 03/27/2018 1649   NA 138 05/19/2016 1521   NA 140 05/16/2013 2240   K 3.8 03/27/2018 1649   K 3.6 05/16/2013 2240   CL 108 03/27/2018 1649   CL 106 05/16/2013 2240   CO2 28 03/27/2018 1649   CO2 30 (H) 05/16/2013 2240   GLUCOSE 89 03/27/2018 1649   GLUCOSE 88 05/16/2013 2240    BUN 13 03/27/2018 1649   BUN 7 05/19/2016 1521   BUN 9 05/16/2013 2240   CREATININE 0.81 03/27/2018 1649   CREATININE 0.87 05/16/2013 2240   CALCIUM 9.4 03/27/2018 1649   CALCIUM 8.8 (L) 05/16/2013 2240   PROT 7.8 03/27/2018 1649   PROT 6.7 05/19/2016 1521   PROT 7.7 05/16/2013 2240   ALBUMIN 4.6 03/27/2018 1649   ALBUMIN 4.0 05/19/2016 1521   ALBUMIN 3.8 05/16/2013 2240   AST 23 03/27/2018 1649   AST 20 05/16/2013 2240   ALT 34 03/27/2018 1649  ALT 21 05/16/2013 2240   ALKPHOS 69 03/27/2018 1649   ALKPHOS 81 05/16/2013 2240   BILITOT 0.4 03/27/2018 1649   BILITOT <0.2 05/19/2016 1521   BILITOT 0.2 05/16/2013 2240   GFRNONAA >60 03/27/2018 1649   GFRAA >60 03/27/2018 1649    Imaging Studies: No results found.  Assessment and Plan:   [name] is a 23 y.o. y/o female who comes with continued abdominal pain.  The patient's previous visit showed the patient to have musculoskeletal pain.  The patient now reports that her pain is more in the right upper quadrant and worse with walking and eating.  The patient denies fatty or greasy foods being worse than anything else.  The patient will be set up for a right upper quadrant ultrasound with a gallbladder ejection study.  If this does not show the cause of her symptoms then the patient will be brought into the office for physical exam to evaluate her to see if she is still having musculoskeletal pain.  The patient has been explained the plan and agrees with it.  Follow Up Instructions:  I discussed the assessment and treatment plan with the patient. The patient was provided an opportunity to ask questions and all were answered. The patient agreed with the plan and demonstrated an understanding of the instructions.   The patient was advised to call back or seek an in-person evaluation if the symptoms worsen or if the condition fails to improve as anticipated.  I provided 15 minutes of non-face-to-face time during this  encounter.   Stephanie Minium, MD  Speech recognition software was used to dictate the above note.

## 2018-07-18 ENCOUNTER — Ambulatory Visit: Payer: Medicaid Other | Admitting: Gastroenterology

## 2018-08-02 ENCOUNTER — Other Ambulatory Visit: Payer: Self-pay

## 2018-08-02 ENCOUNTER — Encounter
Admission: RE | Admit: 2018-08-02 | Discharge: 2018-08-02 | Disposition: A | Discharge status: Home / Self Care | Payer: Medicaid Other | Source: Ambulatory Visit | Attending: Gastroenterology | Admitting: Gastroenterology

## 2018-08-02 DIAGNOSIS — R1011 Right upper quadrant pain: Secondary | ICD-10-CM | POA: Diagnosis present

## 2018-08-02 MED ORDER — TECHNETIUM TC 99M MEBROFENIN IV KIT
4.9560 | PACK | Freq: Once | INTRAVENOUS | Status: AC | PRN
  Administered 2018-08-02: 4.956 via INTRAVENOUS

## 2018-08-07 ENCOUNTER — Telehealth: Payer: Self-pay

## 2018-08-07 ENCOUNTER — Telehealth: Payer: Self-pay | Admitting: Gastroenterology

## 2018-08-07 NOTE — Telephone Encounter (Signed)
-----   Message from Lucilla Lame, MD sent at 08/04/2018  6:47 PM EDT ----- Let the patient know that the gallbladder emptying study was also normal.  This means her gallbladder is working fine.

## 2018-08-07 NOTE — Telephone Encounter (Signed)
Patient is calling for the results to the [place] scan she had done 08-02-2018. Please call with results.

## 2018-08-07 NOTE — Telephone Encounter (Signed)
Pt notified of US/HIDA scan results.

## 2018-08-07 NOTE — Telephone Encounter (Signed)
Pt notified of results and scheduled for an office visit per Dr. Allen Norris.

## 2018-08-16 ENCOUNTER — Ambulatory Visit: Payer: Medicaid Other | Admitting: Gastroenterology

## 2018-08-24 ENCOUNTER — Emergency Department
Admission: EM | Admit: 2018-08-24 | Discharge: 2018-08-24 | Disposition: A | Discharge status: Home / Self Care | Payer: Medicaid Other | Attending: Emergency Medicine | Admitting: Emergency Medicine

## 2018-08-24 ENCOUNTER — Encounter: Payer: Self-pay | Admitting: Emergency Medicine

## 2018-08-24 ENCOUNTER — Emergency Department: Payer: Medicaid Other

## 2018-08-24 ENCOUNTER — Other Ambulatory Visit: Payer: Self-pay

## 2018-08-24 DIAGNOSIS — F17210 Nicotine dependence, cigarettes, uncomplicated: Secondary | ICD-10-CM | POA: Insufficient documentation

## 2018-08-24 DIAGNOSIS — I10 Essential (primary) hypertension: Secondary | ICD-10-CM | POA: Diagnosis not present

## 2018-08-24 DIAGNOSIS — Z79899 Other long term (current) drug therapy: Secondary | ICD-10-CM | POA: Insufficient documentation

## 2018-08-24 DIAGNOSIS — G8929 Other chronic pain: Secondary | ICD-10-CM | POA: Insufficient documentation

## 2018-08-24 DIAGNOSIS — M545 Low back pain, unspecified: Secondary | ICD-10-CM

## 2018-08-24 DIAGNOSIS — J45909 Unspecified asthma, uncomplicated: Secondary | ICD-10-CM | POA: Insufficient documentation

## 2018-08-24 MED ORDER — TRAMADOL HCL 50 MG PO TABS: 50.0000 mg | ORAL_TABLET | Freq: Four times a day (QID) | ORAL | 0 refills | Status: AC | PRN

## 2018-08-24 MED ORDER — CYCLOBENZAPRINE HCL 10 MG PO TABS: 10.0000 mg | ORAL_TABLET | Freq: Three times a day (TID) | ORAL | 0 refills | Status: DC | PRN

## 2018-08-24 MED ORDER — CYCLOBENZAPRINE HCL 10 MG PO TABS
10.0000 mg | ORAL_TABLET | Freq: Once | ORAL | Status: AC
  Administered 2018-08-24: 10 mg via ORAL
  Filled 2018-08-24: qty 1

## 2018-08-24 MED ORDER — TRAMADOL HCL 50 MG PO TABS
50.0000 mg | ORAL_TABLET | Freq: Once | ORAL | Status: AC
  Administered 2018-08-24: 50 mg via ORAL
  Filled 2018-08-24: qty 1

## 2018-08-24 NOTE — ED Triage Notes (Signed)
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

## 2018-08-24 NOTE — ED Provider Notes (Signed)
[facility] Emergency Department Provider Note   ____________________________________________   First MD Initiated Contact with Patient 08/24/18 1136     (approximate)  I have reviewed the triage vital signs and the nursing notes.   HISTORY  Chief Complaint Back Pain    HPI [name] is a 23 y.o. female patient complain of increasing low back pain for the past 3 days.  Patient had a walking incident for complaint.  Patient history of lumbar fracture 10 years ago.  Patient states over the years she has left-sided back pain but this is worse than normal.  Patient denies radicular component to her back pain.  Patient states she does have trouble ambulating because of the back pain.  Patient denies bladder bowel dysfunction.  Patient rates the pain as a 8/10.  Patient described the pain is "achy".  No palliative measure for complaint.         Past Medical History:  Diagnosis Date  . Abdominal pain   . Asthma   . History of hypertension   . Hypertension   . Nausea   . Peptic ulcer disease   . PONV (postoperative nausea and vomiting)   . Pre-diabetes (history)     Patient Active Problem List   Diagnosis Date Noted  . Increased BMI 05/11/2017  . Drug use 05/11/2017  . Tobacco user 01/11/2017  . History of C-section 11/29/2016  . Acquired acanthosis nigricans 11/11/2010  . Depressive disorder 07/19/2010  . Obesity (BMI 30.0-34.9) 07/19/2010  . Extrinsic asthma 04/03/2009    Past Surgical History:  Procedure Laterality Date  . CESAREAN SECTION N/A 11/29/2016   Procedure: CESAREAN SECTION;  Surgeon: Herold Harmsefrancesco, Martin A, MD;  Location: [facility] ORS;  Service: Obstetrics;  Laterality: N/A;  . CESAREAN SECTION N/A 12/30/2017   Procedure: REPEAT CESAREAN SECTION;  Surgeon: Hildred Laserherry, Anika, MD;  Location: [facility] ORS;  Service: Obstetrics;  Laterality: N/A;  . TONSILLECTOMY    . WISDOM TOOTH EXTRACTION      Prior to Admission medications    Medication Sig Start Date End Date Taking? Authorizing Provider  acyclovir (ZOVIRAX) 200 MG capsule TAKE 2 CAPSULES BY MOUTH EVERY 8 HOURS FOR 7 DAYS 01/03/18   [provider]  cyclobenzaprine (FLEXERIL) 10 MG tablet Take 1 tablet (10 mg total) by mouth 3 (three) times daily as needed. 08/24/18   Joni ReiningSmith, Daquann Merriott K, PA-C  etonogestrel (NEXPLANON) 68 MG IMPL implant 1 each by Subdermal route once.    [provider]  traMADol (ULTRAM) 50 MG tablet Take 1 tablet (50 mg total) by mouth every 6 (six) hours as needed for up to 3 days. 08/24/18 08/27/18  Joni ReiningSmith, Josselyn Harkins K, PA-C    Allergies Metformin and related and Sprintec 28 [norgestimate-eth estradiol]  Family History  Problem Relation Age of Onset  . Cholelithiasis Mother   . SIDS Brother   . Cholelithiasis Maternal Grandmother   . Ulcers Maternal Grandmother   . Diabetes Maternal Grandfather   . Celiac disease Neg Hx     Social History Social History   Tobacco Use  . Smoking status: Current Every Day Smoker    Packs/day: 0.25    Types: Cigarettes    Last attempt to quit: 03/24/2016    Years since quitting: 2.4  . Smokeless tobacco: Never Used  Substance Use Topics  . Alcohol use: No  . Drug use: Not Currently    Comment: Denies current use     Review of Systems Constitutional: No fever/chills Eyes: No  visual changes. ENT: No sore throat. Cardiovascular: Denies chest pain. Respiratory: Denies shortness of breath. Gastrointestinal: No abdominal pain.  No nausea, no vomiting.  No diarrhea.  No constipation. Genitourinary: Negative for dysuria. Musculoskeletal: Positive for back pain. Skin: Negative for rash. Neurological: Negative for headaches, focal weakness or numbness.   ____________________________________________   PHYSICAL EXAM:  VITAL SIGNS: ED Triage Vitals  Enc Vitals Group     BP 08/24/18 1120 128/78     Pulse Rate 08/24/18 1120 80     Resp 08/24/18 1120 18     Temp 08/24/18 1120 98 F  (36.7 C)     Temp Source 08/24/18 1120 Oral     SpO2 08/24/18 1120 99 %     Weight 08/24/18 1117 135 lb (61.2 kg)     Height 08/24/18 1117 4\' 11"  (1.499 m)     Head Circumference --      Peak Flow --      Pain Score 08/24/18 1117 8     Pain Loc --      Pain Edu? --      Excl. in GC? --    Constitutional: Alert and oriented. Well appearing and in no acute distress. Cardiovascular: Normal rate, regular rhythm. Grossly normal heart sounds.  Good peripheral circulation. Respiratory: Normal respiratory effort.  No retractions. Lungs CTAB. Musculoskeletal: No obvious spinal deformity.  Patient decreased range of motion with flexion.  Moderate guarding palpation L4-S1.  No lower extremity tenderness nor edema.  No joint effusions. Neurologic:  Normal speech and language. No gross focal neurologic deficits are appreciated. No gait instability. Skin:  Skin is warm, dry and intact. No rash noted. Psychiatric: Mood and affect are normal. Speech and behavior are normal.  ____________________________________________   LABS (all labs ordered are listed, but only abnormal results are displayed)  Labs Reviewed - No data to display ____________________________________________  EKG   ____________________________________________  RADIOLOGY  ED MD interpretation:    Official radiology report(s): Dg Lumbar Spine 2-3 Views  Result Date: 08/24/2018 CLINICAL DATA:  Increasing low back pain 3 days EXAM: LUMBAR SPINE - 2-3 VIEW COMPARISON:  None. FINDINGS: There is no evidence of lumbar spine fracture. Alignment is normal. Intervertebral disc spaces are maintained. IMPRESSION: Negative. Electronically Signed   By: Marlan Palauharles  Clark M.D.   On: 08/24/2018 12:55    ____________________________________________   PROCEDURES  Procedure(s) performed (including Critical Care):  Procedures   ____________________________________________   INITIAL IMPRESSION / ASSESSMENT AND PLAN / ED COURSE  As  part of my medical decision making, I reviewed the following data within the electronic MEDICAL RECORD NUMBER        Patient presents with 3 days of increasing low back pain.  Physical exam is grossly unremarkable.  Patient has no radicular component to her back pain.  Discussed x-ray with patient.  Patient given discharge care instruction work note.  Patient advised follow-up [facility] clinic if condition persist.   ____________________________________________   FINAL CLINICAL IMPRESSION(S) / ED DIAGNOSES  Final diagnoses:  Chronic midline low back pain, unspecified whether sciatica present     ED Discharge Orders         Ordered    traMADol (ULTRAM) 50 MG tablet  Every 6 hours PRN     08/24/18 1321    cyclobenzaprine (FLEXERIL) 10 MG tablet  3 times daily PRN     08/24/18 1321           Note:  This document was prepared using Dragon voice  recognition software and may include unintentional dictation errors.    Sable Feil, PA-C 08/24/18 1325    Earleen Newport, MD 08/24/18 [phone]

## 2018-08-30 ENCOUNTER — Telehealth: Payer: Self-pay | Admitting: Obstetrics and Gynecology

## 2018-08-30 NOTE — Telephone Encounter (Signed)
The patient called and stated that she has had her nexplanon for 8 months and she is now experiencing a 2 wk cycle with heavy bleeding and clotting, The pt stated that she needs to change her pad every hour now. The patient is requesting a call back as soon as possible due to the discomfort and concerns that she now has. Please advise.

## 2018-08-31 ENCOUNTER — Telehealth: Payer: Self-pay

## 2018-08-31 NOTE — Telephone Encounter (Signed)
Pt called no answer and unable to leave a message due to no voicemail was set up.

## 2018-09-05 ENCOUNTER — Other Ambulatory Visit: Payer: Self-pay

## 2018-09-05 ENCOUNTER — Encounter: Payer: Self-pay | Admitting: Obstetrics and Gynecology

## 2018-09-05 ENCOUNTER — Ambulatory Visit (INDEPENDENT_AMBULATORY_CARE_PROVIDER_SITE_OTHER): Payer: Medicaid Other | Admitting: Obstetrics and Gynecology

## 2018-09-05 VITALS — BP 109/70 | HR 76 | Ht 59.0 in | Wt 135.5 lb

## 2018-09-05 DIAGNOSIS — Z975 Presence of (intrauterine) contraceptive device: Secondary | ICD-10-CM | POA: Diagnosis not present

## 2018-09-05 DIAGNOSIS — N921 Excessive and frequent menstruation with irregular cycle: Secondary | ICD-10-CM

## 2018-09-05 DIAGNOSIS — R5383 Other fatigue: Secondary | ICD-10-CM | POA: Diagnosis not present

## 2018-09-05 NOTE — Progress Notes (Signed)
    GYNECOLOGY PROGRESS NOTE  Subjective:    Patient ID: [name], female    DOB: 04-29-1995, 23 y.o.   MRN: [id number]  HPI  Patient is a 23 y.o. [id number] female who presents for complaints of abnormal bleeding on Nexplanon.  Has had Nexplanon in place for ~ 8 months.  Has had abnormal heavy bleeding with passage of blood clots for the past 2 weeks.  Patient notes she must have used at least 30 tampons during that time.Marland Kitchen Has had feelings of fatigue. Denies weakness or dizziness, chest pain, SOB.   Notes that the bleeding just stopped 2 days ago. Reports that she has been bleeding since insertion of the Nexplanon, however was usually just spotting which was manageable.   The following portions of the patient's history were reviewed and updated as appropriate: allergies, current medications, past family history, past medical history, past social history, past surgical history and problem list.  Review of Systems Pertinent items noted in HPI and remainder of comprehensive ROS otherwise negative.     Objective:   Blood pressure 109/70, pulse 76, height 4\' 11"  (1.499 m), weight 135 lb 8 oz (61.5 kg), last menstrual period 08/23/2018, not currently breastfeeding. General appearance: alert and no distress Abdomen: soft, non-tender; bowel sounds normal; no masses,  no organomegaly Pelvic: external genitalia normal, rectovaginal septum normal.  Vagina with scant thin white discharge, no odor.  Cervix normal appearing, no lesions and no motion tenderness.  Uterus mobile, nontender, normal shape and size.  Adnexae non-palpable, nontender bilaterally.    Assessment:   Breakthrough bleeding on Nexplanon Other fatigue   Plan:   - Discussed expectations with Nexplanon, including unscheduled bleeding episodes. Advised that if she has another episode of heavy bleeding, can attempt to treat with add-back estrogen therapy with/without NSAIDs prior to consideration for removal.  Patient ok with  plan. Given 1 month sample of combined OCP (Balcoltra) for bleeding if it occurs again.  - Fatigue, patient notes heavy bleeding x 2 weeks. Will check CBC. Advised on supplemental iron.    [name], MD [facility]

## 2018-09-05 NOTE — Telephone Encounter (Signed)
Please see another phone encounter. Pt was seen in the office today.

## 2018-09-05 NOTE — Progress Notes (Signed)
Pt is present due to having abnormal bleeding for 2 weeks. Uterine pain and clotting. Pt thinks it maybe from the nexplanon.

## 2018-09-06 LAB — HEMOGLOBIN AND HEMATOCRIT, BLOOD
Hematocrit: 39.4 % (ref 34.0–46.6)
Hemoglobin: 12.5 g/dL (ref 11.1–15.9)

## 2018-09-18 ENCOUNTER — Other Ambulatory Visit: Payer: Self-pay

## 2018-09-18 ENCOUNTER — Ambulatory Visit (INDEPENDENT_AMBULATORY_CARE_PROVIDER_SITE_OTHER): Payer: Medicaid Other | Admitting: Gastroenterology

## 2018-09-18 VITALS — BP 103/68 | HR 99 | Temp 98.3°F | Ht 59.0 in | Wt 134.4 lb

## 2018-09-18 DIAGNOSIS — R112 Nausea with vomiting, unspecified: Secondary | ICD-10-CM | POA: Diagnosis not present

## 2018-09-18 DIAGNOSIS — R109 Unspecified abdominal pain: Secondary | ICD-10-CM | POA: Diagnosis not present

## 2018-09-18 NOTE — Progress Notes (Signed)
Primary Care Physician[facility]  Primary Gastroenterologist:  Dr. Midge Miniumarren Ladarrian Asencio  Chief Complaint  Patient presents with  . Follow up US and lab results    HPI: [name] is a 23 y.o. female here for follow-up of abdominal pain.  The patient had seen me in February for abdominal pain and was clearly suffering from musculoskeletal pain.  The patient then set herself up for a virtual video visit in May with similar pain.  The patient reported that it was worse in the right upper quadrant and says it was worse with eating.  The patient was therefore brought in for a office visit and set up for a right upper quadrant ultrasound with an ejection fraction of her gallbladder.  The patient's ejection fraction of her gallbladder was 43% which was normal. The patient reported that the pain was worse with walking as well as eating.  The patient reports that she wakes up in the morning and has a lot of nausea that lasts throughout the day.  She reports that she has lost approximately 30 pounds over the last year and not try because she states that she has the nausea and she does not want to eat.  The patient also reports that she has intermittent diarrhea with the diarrhea usually happening in the morning and it typically 3 times a day without the diarrhea waking her up from a sound sleep.  Current Outpatient Medications  Medication Sig Dispense Refill  . cyclobenzaprine (FLEXERIL) 10 MG tablet Take 1 tablet (10 mg total) by mouth 3 (three) times daily as needed. 15 tablet 0  . etonogestrel (NEXPLANON) 68 MG IMPL implant 1 each by Subdermal route once.     No current facility-administered medications for this visit.     Allergies as of 09/18/2018 - Review Complete 09/05/2018  Allergen Reaction Noted  . Metformin and related Shortness Of Breath 07/14/2011  . Sprintec 28 [norgestimate-eth estradiol] Hives 05/28/2013    ROS:  General: Negative for anorexia, weight  loss, fever, chills, fatigue, weakness. ENT: Negative for hoarseness, difficulty swallowing , nasal congestion. CV: Negative for chest pain, angina, palpitations, dyspnea on exertion, peripheral edema.  Respiratory: Negative for dyspnea at rest, dyspnea on exertion, cough, sputum, wheezing.  GI: See history of present illness. GU:  Negative for dysuria, hematuria, urinary incontinence, urinary frequency, nocturnal urination.  Endo: Negative for unusual weight change.    Physical Examination:   BP 103/68   Pulse 99   Temp 98.3 F (36.8 C) (Oral)   Ht 4\' 11"  (1.499 m)   Wt 134 lb 6.4 oz (61 kg)   LMP 08/23/2018 (Exact Date)   BMI 27.15 kg/m   General: Well-nourished, well-developed in no acute distress.  Eyes: No icterus. Conjunctivae pink. Mouth: Oropharyngeal mucosa moist and pink , no lesions erythema or exudate. Lungs: Clear to auscultation bilaterally. Non-labored. Heart: Regular rate and rhythm, no murmurs rubs or gallops.  Abdomen: Bowel sounds are normal, mild tenderness in the right upper to middle abdomen, nondistended, no hepatosplenomegaly or masses, no abdominal bruits or hernia , no rebound or guarding.   Extremities: No lower extremity edema. No clubbing or deformities. Neuro: Alert and oriented x 3.  Grossly intact. Skin: Warm and dry, no jaundice.   Psych: Alert and cooperative, normal mood and affect.  Labs:    Imaging Studies: Dg Lumbar Spine 2-3 Views  Result Date: 08/24/2018 CLINICAL DATA:  Increasing low back pain 3 days EXAM: LUMBAR SPINE -  2-3 VIEW COMPARISON:  None. FINDINGS: There is no evidence of lumbar spine fracture. Alignment is normal. Intervertebral disc spaces are maintained. IMPRESSION: Negative. Electronically Signed   By: Franchot Gallo M.D.   On: 08/24/2018 12:55    Assessment and Plan:   [name] is a 83 y.o. y/o female who comes in with nausea mostly in the morning with some vomiting.  The patient reports that after she vomits a  few times that only acid comes up.  The patient also reports that her diarrhea is 3 times a day without any rectal bleeding or family history of colon cancer or colon polyps.  The patient will be started on a trial of Dexilant that she has been given samples to try.  If her symptoms do not improve she may need a CT scan of the abdomen for her right side abdominal pain.  The patient has been explained the plan and agrees with it.    Lucilla Lame, MD. [facility]   Note: This dictation was prepared with Dragon dictation along with smaller phrase technology. Any transcriptional errors that result from this process are unintentional.

## 2018-09-26 NOTE — Progress Notes (Signed)
Pt is present for nexplanon removal. Pt stated that she having a lot of vaginal bleeding since the insertion and would like to switch to the birth control patches.

## 2018-09-27 ENCOUNTER — Encounter: Payer: Self-pay | Admitting: Obstetrics and Gynecology

## 2018-09-27 ENCOUNTER — Other Ambulatory Visit: Payer: Self-pay

## 2018-09-27 ENCOUNTER — Ambulatory Visit (INDEPENDENT_AMBULATORY_CARE_PROVIDER_SITE_OTHER): Payer: Medicaid Other | Admitting: Obstetrics and Gynecology

## 2018-09-27 VITALS — BP 109/63 | HR 94 | Ht 59.0 in | Wt 135.0 lb

## 2018-09-27 DIAGNOSIS — N921 Excessive and frequent menstruation with irregular cycle: Secondary | ICD-10-CM

## 2018-09-27 DIAGNOSIS — Z975 Presence of (intrauterine) contraceptive device: Secondary | ICD-10-CM

## 2018-09-27 DIAGNOSIS — Z3046 Encounter for surveillance of implantable subdermal contraceptive: Secondary | ICD-10-CM

## 2018-09-27 DIAGNOSIS — Z30016 Encounter for initial prescription of transdermal patch hormonal contraceptive device: Secondary | ICD-10-CM

## 2018-09-27 MED ORDER — XULANE 150-35 MCG/24HR TD PTWK: 1.0000 | MEDICATED_PATCH | TRANSDERMAL | 12 refills | Status: DC

## 2018-09-27 NOTE — Progress Notes (Signed)
     GYNECOLOGY OFFICE PROCEDURE NOTE  [name] is a 23 y.o. [id number] here for Nexplanon removal insertion.  Last pap smear was on 06/20/2017 and was normal.  No other gynecologic concerns.   Nexplanon Removal Patient identified, informed consent performed, consent signed.   Appropriate time out taken. Nexplanon site identified.  Area prepped in usual sterile fashon. One ml of 1% lidocaine was used to anesthetize the area at the distal end of the implant. A small stab incision was made right beside the implant on the distal portion.  The Nexplanon rod was grasped using hemostats and removed without difficulty.  There was minimal blood loss. There were no complications.  3 ml of 1% lidocaine was injected around the incision for post-procedure analgesia.  Steri-strips were applied over the small incision.  A pressure bandage was applied to reduce any bruising.  The patient tolerated the procedure well and was given post procedure instructions.  Patient is planning to use Xulane contraceptive patch for contraception, given sample patch today.     [name], MD [facility]

## 2019-02-09 ENCOUNTER — Telehealth: Payer: Self-pay | Admitting: Obstetrics and Gynecology

## 2019-02-14 ENCOUNTER — Other Ambulatory Visit: Payer: Self-pay

## 2019-02-14 ENCOUNTER — Other Ambulatory Visit: Payer: Medicaid Other

## 2019-02-14 DIAGNOSIS — Z111 Encounter for screening for respiratory tuberculosis: Secondary | ICD-10-CM

## 2019-02-16 LAB — QUANTIFERON-TB GOLD PLUS
QuantiFERON Mitogen Value: >10 IU/mL
QuantiFERON Nil Value: 0.03 IU/mL
QuantiFERON TB1 Ag Value: 0.04 IU/mL
QuantiFERON TB2 Ag Value: 0.03 IU/mL
QuantiFERON-TB Gold Plus: NEGATIVE

## 2019-03-01 ENCOUNTER — Telehealth: Payer: Self-pay

## 2019-03-01 NOTE — Telephone Encounter (Signed)
Patient is coming by around noon, for a copy of her TB test. Her job needs it on file

## 2019-03-01 NOTE — Telephone Encounter (Signed)
Completed.

## 2019-03-12 NOTE — Telephone Encounter (Signed)
Error

## 2019-06-11 NOTE — Progress Notes (Signed)
Pt present for annual exam. Pt c/o of abd pain when she eats, breast pain off and on.

## 2019-06-12 ENCOUNTER — Other Ambulatory Visit: Payer: Self-pay

## 2019-06-12 ENCOUNTER — Ambulatory Visit (INDEPENDENT_AMBULATORY_CARE_PROVIDER_SITE_OTHER): Payer: Medicaid Other | Admitting: Obstetrics and Gynecology

## 2019-06-12 ENCOUNTER — Encounter: Payer: Self-pay | Admitting: Obstetrics and Gynecology

## 2019-06-12 ENCOUNTER — Other Ambulatory Visit (HOSPITAL_COMMUNITY)
Admission: RE | Admit: 2019-06-12 | Discharge: 2019-06-12 | Disposition: A | Discharge status: Home / Self Care | Payer: Medicaid Other | Source: Ambulatory Visit | Attending: Obstetrics and Gynecology | Admitting: Obstetrics and Gynecology

## 2019-06-12 VITALS — BP 116/77 | HR 93 | Ht 59.0 in | Wt 122.0 lb

## 2019-06-12 DIAGNOSIS — Z01419 Encounter for gynecological examination (general) (routine) without abnormal findings: Secondary | ICD-10-CM | POA: Diagnosis not present

## 2019-06-12 DIAGNOSIS — F32A Depression, unspecified: Secondary | ICD-10-CM

## 2019-06-12 DIAGNOSIS — F419 Anxiety disorder, unspecified: Secondary | ICD-10-CM

## 2019-06-12 DIAGNOSIS — R1013 Epigastric pain: Secondary | ICD-10-CM | POA: Diagnosis not present

## 2019-06-12 DIAGNOSIS — Z113 Encounter for screening for infections with a predominantly sexual mode of transmission: Secondary | ICD-10-CM

## 2019-06-12 DIAGNOSIS — N644 Mastodynia: Secondary | ICD-10-CM

## 2019-06-12 DIAGNOSIS — F329 Major depressive disorder, single episode, unspecified: Secondary | ICD-10-CM

## 2019-06-12 MED ORDER — ESCITALOPRAM OXALATE 10 MG PO TABS: 10.0000 mg | ORAL_TABLET | Freq: Every day | ORAL | 0 refills | Status: DC

## 2019-06-12 MED ORDER — LANSOPRAZOLE 30 MG PO CPDR: 30.0000 mg | DELAYED_RELEASE_CAPSULE | Freq: Every day | ORAL | 0 refills | Status: DC

## 2019-06-12 NOTE — Progress Notes (Signed)
GYNECOLOGY ANNUAL PHYSICAL EXAM PROGRESS NOTE  Subjective:    [name] is a 24 y.o. [phone] female who presents for an annual exam. The patient is sexually active. The patient wears seatbelts: yes. The patient participates in regular exercise: no.   The patient has the following complaints today:  1. She continues to note epigastric pain mostly when eating, to the point where she is not able to eat much. Pain feels like an intense "band-like" distrubution across her abdomen. Notes that it does not matter what types of foods she eats. Also some associated diarrhea. Has tried Tums, Pepcid, and Prilosec. Has modified diet and eats smaller portions.  Pain happens every morning while fasting, and can last for hours.  Pain Notes that she has been losing weight due to not wanting to eat. Pain has been ongoing for since 2019. Has been seen by GI last year, had a normal RUQ ultrasound. States that the GI provider noted he didn't want to do any further evaluation at this time due to her young age.  2.  [name] reports intermittent bilateral pain in breasts. Is not always associated with her menstrual cycle. Denies caffiene intake. Has not tried anything for relief.  3. Patient also desires to discuss her anxiety (and depression).  Has a history in the past, especially in pregnancy, however was inconsistent with use of anti-depressant medications in the past. She states that she feels like her anxiety is getting somewhat worse.  Wonders if her GI symptoms could be related to stress and anxiety. States she has been stressed over trying to get housing for the past 1-2 years. She also reports that she has bene under more stress due to her kids being home schooled due to [name] pandemic.   Gynecologic History  Menarche age: 37 or 7 Patient's last menstrual period was 06/07/2019. Contraception: Xulane patches weekly History of STI's: Denies Last Pap: 05/2017. Results were: normal.  Denies h/o abnormal pap  smears.    OB History  Gravida Para Term Preterm AB Living  4 2 2  0 2 2  SAB TAB Ectopic Multiple Live Births  2 0 0 0 2    # Outcome Date GA Lbr Len/2nd Weight Sex Delivery Anes PTL Lv  4 Term 12/30/17 [redacted]w[redacted]d  6 lb 11.2 oz (3.04 kg) F CS-LTranv Spinal  LIV     Name: [name]     Apgar1: 8  Apgar5: 9  3 Term 11/29/16 [redacted]w[redacted]d  6 lb 10.5 oz (3.02 kg) M CS-LTranv EPI  LIV     Name: [name]     Apgar1: 8  Apgar5: 9  2 SAB 2015        ND  1 SAB 2012        ND    Past Medical History:  Diagnosis Date  . Abdominal pain   . Asthma   . History of hypertension   . Hypertension   . Nausea   . Peptic ulcer disease   . PONV (postoperative nausea and vomiting)   . Pre-diabetes (history)     Past Surgical History:  Procedure Laterality Date  . CESAREAN SECTION N/A 11/29/2016   Procedure: CESAREAN SECTION;  Surgeon: Brayton Mars, MD;  Location: [facility] ORS;  Service: Obstetrics;  Laterality: N/A;  . CESAREAN SECTION N/A 12/30/2017   Procedure: REPEAT CESAREAN SECTION;  Surgeon: [name], MD;  Location: [facility] ORS;  Service: Obstetrics;  Laterality: N/A;  . TONSILLECTOMY    . WISDOM TOOTH EXTRACTION  Family History  Problem Relation Age of Onset  . Cholelithiasis Mother   . SIDS Brother   . Cholelithiasis Maternal Grandmother   . Ulcers Maternal Grandmother   . Diabetes Maternal Grandfather   . Celiac disease Neg Hx     Social History   Socioeconomic History  . Marital status: Single    Spouse name: Not on file  . Number of children: Not on file  . Years of education: Not on file  . Highest education level: Not on file  Occupational History  . Not on file  Tobacco Use  . Smoking status: Current Every Day Smoker    Packs/day: 0.25    Types: Cigarettes    Last attempt to quit: 03/24/2016    Years since quitting: 3.2  . Smokeless tobacco: Never Used  Substance and Sexual Activity  . Alcohol use: No  . Drug use: Not Currently     Comment: Denies current use   . Sexual activity: Yes    Partners: Male    Birth control/protection: None, Implant    Comment: Nexplanon   Other Topics Concern  . Not on file  Social History Narrative   11th grade 2014-2015   Social Determinants of Health   Financial Resource Strain:   . Difficulty of Paying Living Expenses:   Food Insecurity:   . Worried About Programme researcher, broadcasting/film/video in the Last Year:   . Barista in the Last Year:   Transportation Needs:   . Freight forwarder (Medical):   Marland Kitchen Lack of Transportation (Non-Medical):   Physical Activity:   . Days of Exercise per Week:   . Minutes of Exercise per Session:   Stress:   . Feeling of Stress :   Social Connections:   . Frequency of Communication with Friends and Family:   . Frequency of Social Gatherings with Friends and Family:   . Attends Religious Services:   . Active Member of Clubs or Organizations:   . Attends Banker Meetings:   Marland Kitchen Marital Status:   Intimate Partner Violence:   . Fear of Current or Ex-Partner:   . Emotionally Abused:   Marland Kitchen Physically Abused:   . Sexually Abused:     Current Outpatient Medications on File Prior to Visit  Medication Sig Dispense Refill  . norelgestromin-ethinyl estradiol [facility]) 150-35 MCG/24HR transdermal patch Place 1 patch onto the skin once a week. 3 patch 12   No current facility-administered medications on file prior to visit.    Allergies  Allergen Reactions  . Metformin And Related Shortness Of Breath  . Sprintec 28 [Norgestimate-Eth Estradiol] Hives      Review of Systems Constitutional: negative for chills, fatigue, fevers and sweats Eyes: negative for irritation, redness and visual disturbance Ears, nose, mouth, throat, and face: negative for hearing loss, nasal congestion, snoring and tinnitus Respiratory: negative for asthma, cough, sputum Cardiovascular: negative for chest pain, dyspnea, exertional chest pressure/discomfort,  irregular heart beat, palpitations and syncope Gastrointestinal: positive for abdominal pain (see above), change in bowel habits.  Negative for nausea and vomiting.  Genitourinary: negative for abnormal menstrual periods, genital lesions, sexual problems and vaginal discharge, dysuria and urinary incontinence Integument/breast: negative for breast lump, breast tenderness and nipple discharge Hematologic/lymphatic: negative for bleeding and easy bruising Musculoskeletal:negative for back pain and muscle weakness Neurological: negative for dizziness, headaches, vertigo and weakness Endocrine: negative for diabetic symptoms including polydipsia, polyuria and skin dryness Allergic/Immunologic: negative for hay fever and urticaria  Objective:  Blood pressure 116/77, pulse 93, height 4\' 11"  (1.499 m), weight 122 lb (55.3 kg), last menstrual period 06/07/2019, not currently breastfeeding. Body mass index is 24.64 kg/m.  General Appearance:    Alert, cooperative, no distress, appears stated age, overweight  Head:    Normocephalic, without obvious abnormality, atraumatic  Eyes:    PERRL, conjunctiva/corneas clear, EOM's intact, both eyes  Ears:    Normal external ear canals, both ears  Nose:   Nares normal, septum midline, mucosa normal, no drainage or sinus tenderness  Throat:   Lips, mucosa, and tongue normal; teeth and gums normal  Neck:   Supple, symmetrical, trachea midline, no adenopathy; thyroid: no enlargement/tenderness/nodules; no carotid bruit or JVD  Back:     Symmetric, no curvature, ROM normal, no CVA tenderness  Lungs:     Clear to auscultation bilaterally, respirations unlabored  Chest Wall:    No tenderness or deformity   Heart:    Regular rate and rhythm, S1 and S2 normal, no murmur, rub or gallop  Breast Exam:    Mild general tenderness bilaterally. No masses, or nipple abnormality  Abdomen:     Soft, non-tender, bowel sounds active all four quadrants, no masses, no  organomegaly.    Genitalia:    Pelvic:external genitalia normal, vagina without lesions, discharge, or tenderness, rectovaginal septum  normal. Cervix normal in appearance, no cervical motion tenderness, no adnexal masses or tenderness.  Uterus normal size, shape, mobile, regular contours, nontender.  Rectal:    Normal external sphincter.  No hemorrhoids appreciated. Internal exam not done.   Extremities:   Extremities normal, atraumatic, no cyanosis or edema  Pulses:   2+ and symmetric all extremities  Skin:   Skin color, texture, turgor normal, no rashes or lesions  Lymph nodes:   Cervical, supraclavicular, and axillary nodes normal  Neurologic:   CNII-XII intact, normal strength, sensation and reflexes throughout   .  Labs:  Lab Results  Component Value Date   WBC 7.2 03/27/2018   HGB 12.5 09/05/2018   HCT 39.4 09/05/2018   MCV 80.7 03/27/2018   PLT 240 03/27/2018    Lab Results  Component Value Date   CREATININE 0.81 03/27/2018   BUN 13 03/27/2018   NA 141 03/27/2018   K 3.8 03/27/2018   CL 108 03/27/2018   CO2 28 03/27/2018    Lab Results  Component Value Date   ALT 34 03/27/2018   AST 23 03/27/2018   ALKPHOS 69 03/27/2018   BILITOT 0.4 03/27/2018    Lab Results  Component Value Date   TSH 0.878 04/16/2016    Assessment:   1. Encounter for well woman exam with routine gynecological exam   2. Anxiety and depression   3. Epigastric pain   4. Screening examination for STD (sexually transmitted disease)   5. Breast pain     Plan:    - Blood tests: CBC with diff and Comprehensive metabolic panel. - Breast self exam technique reviewed and patient encouraged to perform self-exam monthly. - Discussed that breast pain may be due to use of hormonal contraceptive. Is mild, intermittent. Offered a lower dose option however patient notes she likes the patch.  Patient not overly concerned, advised to use   - Contraception: Xulane contraceptive patch. - Discussed  healthy lifestyle modifications. - Pap smear up to date.  - STD screening performed as recommended by age-based screening guidelines.  - Discussed patient's anxiety and depression symptoms, she is willing to try medication, declines  counseling. Will start patient on Lexapro.  Epigastric pain of unknown cause. Has been worked up by GI with no findings, has tried OTC meds and at least 2 prescription meds with no relief. Will try Prevacid, to take in the evenings as most of her symptoms occur upon wakening. Will also see if symptoms are stress/anxiety related as she is simultaneously starting an anti-depressant.  - Follow up in 1 month for depression and GI symptoms. Follow up in 1 year for annual exam.     Hildred Laser, MD [facility]

## 2019-06-12 NOTE — Patient Instructions (Signed)
Health Maintenance, Female Adopting a healthy lifestyle and getting preventive care are important in promoting health and wellness. Ask your health care provider about:  The right schedule for you to have regular tests and exams.  Things you can do on your own to prevent diseases and keep yourself healthy. What should I know about diet, weight, and exercise? Eat a healthy diet   Eat a diet that includes plenty of vegetables, fruits, low-fat dairy products, and lean protein.  Do not eat a lot of foods that are high in solid fats, added sugars, or sodium. Maintain a healthy weight Body mass index (BMI) is used to identify weight problems. It estimates body fat based on height and weight. Your health care provider can help determine your BMI and help you achieve or maintain a healthy weight. Get regular exercise Get regular exercise. This is one of the most important things you can do for your health. Most adults should:  Exercise for at least 150 minutes each week. The exercise should increase your heart rate and make you sweat (moderate-intensity exercise).  Do strengthening exercises at least twice a week. This is in addition to the moderate-intensity exercise.  Spend less time sitting. Even light physical activity can be beneficial. Watch cholesterol and blood lipids Have your blood tested for lipids and cholesterol at 24 years of age, then have this test every 5 years. Have your cholesterol levels checked more often if:  Your lipid or cholesterol levels are high.  You are older than 24 years of age.  You are at high risk for heart disease. What should I know about cancer screening? Depending on your health history and family history, you may need to have cancer screening at various ages. This may include screening for:  Breast cancer.  Cervical cancer.  Colorectal cancer.  Skin cancer.  Lung cancer. What should I know about heart disease, diabetes, and high blood  pressure? Blood pressure and heart disease  High blood pressure causes heart disease and increases the risk of stroke. This is more likely to develop in people who have high blood pressure readings, are of African descent, or are overweight.  Have your blood pressure checked: ? Every 3-5 years if you are 18-39 years of age. ? Every year if you are 40 years old or older. Diabetes Have regular diabetes screenings. This checks your fasting blood sugar level. Have the screening done:  Once every three years after age 40 if you are at a normal weight and have a low risk for diabetes.  More often and at a younger age if you are overweight or have a high risk for diabetes. What should I know about preventing infection? Hepatitis B If you have a higher risk for hepatitis B, you should be screened for this virus. Talk with your health care provider to find out if you are at risk for hepatitis B infection. Hepatitis C Testing is recommended for:  Everyone born from 1945 through 1965.  Anyone with known risk factors for hepatitis C. Sexually transmitted infections (STIs)  Get screened for STIs, including gonorrhea and chlamydia, if: ? You are sexually active and are younger than 24 years of age. ? You are older than 24 years of age and your health care provider tells you that you are at risk for this type of infection. ? Your sexual activity has changed since you were last screened, and you are at increased risk for chlamydia or gonorrhea. Ask your health care provider if   you are at risk.  Ask your health care provider about whether you are at high risk for HIV. Your health care provider may recommend a prescription medicine to help prevent HIV infection. If you choose to take medicine to prevent HIV, you should first get tested for HIV. You should then be tested every 3 months for as long as you are taking the medicine. Pregnancy  If you are about to stop having your period (premenopausal) and  you may become pregnant, seek counseling before you get pregnant.  Take 400 to 800 micrograms (mcg) of folic acid every day if you become pregnant.  Ask for birth control (contraception) if you want to prevent pregnancy. Osteoporosis and menopause Osteoporosis is a disease in which the bones lose minerals and strength with aging. This can result in bone fractures. If you are 45 years old or older, or if you are at risk for osteoporosis and fractures, ask your health care provider if you should:  Be screened for bone loss.  Take a calcium or vitamin D supplement to lower your risk of fractures.  Be given hormone replacement therapy (HRT) to treat symptoms of menopause. Follow these instructions at home: Lifestyle  Do not use any products that contain nicotine or tobacco, such as cigarettes, e-cigarettes, and chewing tobacco. If you need help quitting, ask your health care provider.  Do not use street drugs.  Do not share needles.  Ask your health care provider for help if you need support or information about quitting drugs. Alcohol use  Do not drink alcohol if: ? Your health care provider tells you not to drink. ? You are pregnant, may be pregnant, or are planning to become pregnant.  If you drink alcohol: ? Limit how much you use to 0-1 drink a day. ? Limit intake if you are breastfeeding.  Be aware of how much alcohol is in your drink. In the [place] one drink equals one 12 oz bottle of beer (355 mL), one 5 oz glass of wine (148 mL), or one 1 oz glass of hard liquor (44 mL). General instructions  Schedule regular health, dental, and eye exams.  Stay current with your vaccines.  Tell your health care provider if: ? You often feel depressed. ? You have ever been abused or do not feel safe at home. Summary  Adopting a healthy lifestyle and getting preventive care are important in promoting health and wellness.  Follow your health care provider's instructions about healthy  diet, exercising, and getting tested or screened for diseases.  Follow your health care provider's instructions on monitoring your cholesterol and blood pressure. This information is not intended to replace advice given to you by your health care provider. Make sure you discuss any questions you have with your health care provider. Document Revised: 02/08/2018 Document Reviewed: 02/08/2018 Elsevier Patient Education  [employer].    Abdominal Pain, Adult Many things can cause belly (abdominal) pain. Most times, belly pain is not dangerous. Many cases of belly pain can be watched and treated at home. Sometimes, though, belly pain is serious. Your doctor will try to find the cause of your belly pain. Follow these instructions at home:  Medicines  Take over-the-counter and prescription medicines only as told by your doctor.  Do not take medicines that help you poop (laxatives) unless told by your doctor. General instructions  Watch your belly pain for any changes.  Drink enough fluid to keep your pee (urine) pale yellow.  Keep all follow-up  visits as told by your doctor. This is important. Contact a doctor if:  Your belly pain changes or gets worse.  You are not hungry, or you lose weight without trying.  You are having trouble pooping (constipated) or have watery poop (diarrhea) for more than 2-3 days.  You have pain when you pee or poop.  Your belly pain wakes you up at night.  Your pain gets worse with meals, after eating, or with certain foods.  You are vomiting and cannot keep anything down.  You have a fever.  You have blood in your pee. Get help right away if:  Your pain does not go away as soon as your doctor says it should.  You cannot stop vomiting.  Your pain is only in areas of your belly, such as the right side or the left lower part of the belly.  You have bloody or black poop, or poop that looks like tar.  You have very bad pain, cramping, or  bloating in your belly.  You have signs of not having enough fluid or water in your body (dehydration), such as: ? Dark pee, very little pee, or no pee. ? Cracked lips. ? Dry mouth. ? Sunken eyes. ? Sleepiness. ? Weakness.  You have trouble breathing or chest pain. Summary  Many cases of belly pain can be watched and treated at home.  Watch your belly pain for any changes.  Take over-the-counter and prescription medicines only as told by your doctor.  Contact a doctor if your belly pain changes or gets worse.  Get help right away if you have very bad pain, cramping, or bloating in your belly. This information is not intended to replace advice given to you by your health care provider. Make sure you discuss any questions you have with your health care provider. Document Revised: 06/26/2018 Document Reviewed: 06/26/2018 Elsevier Patient Education  2020 [employer].

## 2019-06-13 ENCOUNTER — Encounter (HOSPITAL_COMMUNITY): Payer: Self-pay | Admitting: *Deleted

## 2019-06-13 ENCOUNTER — Other Ambulatory Visit: Payer: Self-pay

## 2019-06-13 ENCOUNTER — Emergency Department (HOSPITAL_COMMUNITY)
Admission: EM | Admit: 2019-06-13 | Discharge: 2019-06-14 | Disposition: A | Discharge status: Home / Self Care | Payer: Medicaid Other | Attending: Emergency Medicine | Admitting: Emergency Medicine

## 2019-06-13 DIAGNOSIS — R197 Diarrhea, unspecified: Secondary | ICD-10-CM | POA: Insufficient documentation

## 2019-06-13 DIAGNOSIS — Z5321 Procedure and treatment not carried out due to patient leaving prior to being seen by health care provider: Secondary | ICD-10-CM | POA: Diagnosis not present

## 2019-06-13 DIAGNOSIS — R110 Nausea: Secondary | ICD-10-CM | POA: Insufficient documentation

## 2019-06-13 DIAGNOSIS — R1010 Upper abdominal pain, unspecified: Secondary | ICD-10-CM | POA: Diagnosis present

## 2019-06-13 LAB — COMPREHENSIVE METABOLIC PANEL
ALT: 13 IU/L (ref 0–32)
AST: 14 IU/L (ref 0–40)
Albumin/Globulin Ratio: 1.7 (ref 1.2–2.2)
Albumin: 4.3 g/dL (ref 3.9–5.0)
Alkaline Phosphatase: 64 IU/L (ref 39–117)
BUN/Creatinine Ratio: 14 (ref 9–23)
BUN: 10 mg/dL (ref 6–20)
Bilirubin Total: <0.2 mg/dL (ref 0.0–1.2)
CO2: 24 mmol/L (ref 20–29)
Calcium: 9 mg/dL (ref 8.7–10.2)
Chloride: 103 mmol/L (ref 96–106)
Creatinine, Ser: 0.73 mg/dL (ref 0.57–1.00)
GFR calc Af Amer: 134 mL/min/{1.73_m2} (ref >59)
GFR calc non Af Amer: 116 mL/min/{1.73_m2} (ref >59)
Globulin, Total: 2.5 g/dL (ref 1.5–4.5)
Glucose: 69 mg/dL (ref 65–99)
Potassium: 5.1 mmol/L (ref 3.5–5.2)
Sodium: 141 mmol/L (ref 134–144)
Total Protein: 6.8 g/dL (ref 6.0–8.5)

## 2019-06-13 LAB — CBC
HCT: 42.3 % (ref 36.0–46.0)
Hematocrit: 42.4 % (ref 34.0–46.6)
Hemoglobin: 14.1 g/dL (ref 11.1–15.9)
Hemoglobin: 13.4 g/dL (ref 12.0–15.0)
MCH: 29.9 pg (ref 26.6–33.0)
MCH: 28.8 pg (ref 26.0–34.0)
MCHC: 33.3 g/dL (ref 31.5–35.7)
MCHC: 31.7 g/dL (ref 30.0–36.0)
MCV: 90 fL (ref 79–97)
MCV: 91 fL (ref 80.0–100.0)
Platelets: 232 10*3/uL (ref 150–450)
Platelets: 239 10*3/uL (ref 150–400)
RBC: 4.72 x10E6/uL (ref 3.77–5.28)
RBC: 4.65 MIL/uL (ref 3.87–5.11)
RDW: 12.7 % (ref 11.7–15.4)
RDW: 13.4 % (ref 11.5–15.5)
WBC: 5.6 10*3/uL (ref 3.4–10.8)
WBC: 8.4 10*3/uL (ref 4.0–10.5)
nRBC: 0 % (ref 0.0–0.2)

## 2019-06-13 LAB — URINALYSIS, ROUTINE W REFLEX MICROSCOPIC
Bilirubin Urine: NEGATIVE
Glucose, UA: NEGATIVE mg/dL
Hgb urine dipstick: NEGATIVE
Ketones, ur: 5 mg/dL — AB
Leukocytes,Ua: NEGATIVE
Nitrite: NEGATIVE
Protein, ur: NEGATIVE mg/dL
Specific Gravity, Urine: 1.029 (ref 1.005–1.030)
pH: 5 (ref 5.0–8.0)

## 2019-06-13 MED ORDER — SODIUM CHLORIDE 0.9% FLUSH: 3.0000 mL | Freq: Once | INTRAVENOUS | Status: DC

## 2019-06-13 NOTE — ED Triage Notes (Signed)
Pt c/o episodes of upper abdominal pain, diarrhea and nausea. Happens "everyday in the mornings and then at night" Has been given antiacid medications, no relief for the same.

## 2019-06-14 ENCOUNTER — Telehealth: Payer: Self-pay | Admitting: Gastroenterology

## 2019-06-14 ENCOUNTER — Emergency Department
Admission: EM | Admit: 2019-06-14 | Discharge: 2019-06-14 | Disposition: A | Discharge status: Home / Self Care | Payer: Medicaid Other | Attending: Emergency Medicine | Admitting: Emergency Medicine

## 2019-06-14 ENCOUNTER — Emergency Department: Payer: Medicaid Other

## 2019-06-14 ENCOUNTER — Encounter: Payer: Self-pay | Admitting: Emergency Medicine

## 2019-06-14 DIAGNOSIS — I10 Essential (primary) hypertension: Secondary | ICD-10-CM | POA: Insufficient documentation

## 2019-06-14 DIAGNOSIS — Z79899 Other long term (current) drug therapy: Secondary | ICD-10-CM | POA: Insufficient documentation

## 2019-06-14 DIAGNOSIS — J45909 Unspecified asthma, uncomplicated: Secondary | ICD-10-CM | POA: Diagnosis not present

## 2019-06-14 DIAGNOSIS — R1013 Epigastric pain: Secondary | ICD-10-CM

## 2019-06-14 DIAGNOSIS — F17210 Nicotine dependence, cigarettes, uncomplicated: Secondary | ICD-10-CM | POA: Diagnosis not present

## 2019-06-14 LAB — CBC
HCT: 43 % (ref 36.0–46.0)
Hemoglobin: 13.9 g/dL (ref 12.0–15.0)
MCH: 29.1 pg (ref 26.0–34.0)
MCHC: 32.3 g/dL (ref 30.0–36.0)
MCV: 90 fL (ref 80.0–100.0)
Platelets: 235 10*3/uL (ref 150–400)
RBC: 4.78 MIL/uL (ref 3.87–5.11)
RDW: 13.2 % (ref 11.5–15.5)
WBC: 8.7 10*3/uL (ref 4.0–10.5)
nRBC: 0 % (ref 0.0–0.2)

## 2019-06-14 LAB — URINALYSIS, COMPLETE (UACMP) WITH MICROSCOPIC
Bacteria, UA: NONE SEEN
Bilirubin Urine: NEGATIVE
Glucose, UA: NEGATIVE mg/dL
Hgb urine dipstick: NEGATIVE
Ketones, ur: 5 mg/dL — AB
Leukocytes,Ua: NEGATIVE
Nitrite: NEGATIVE
Protein, ur: 30 mg/dL — AB
Specific Gravity, Urine: 1.032 — ABNORMAL HIGH (ref 1.005–1.030)
pH: 5 (ref 5.0–8.0)

## 2019-06-14 LAB — COMPREHENSIVE METABOLIC PANEL
ALT: 16 U/L (ref 0–44)
ALT: 14 U/L (ref 0–44)
AST: 15 U/L (ref 15–41)
AST: 14 U/L — ABNORMAL LOW (ref 15–41)
Albumin: 3.6 g/dL (ref 3.5–5.0)
Albumin: 3.9 g/dL (ref 3.5–5.0)
Alkaline Phosphatase: 54 U/L (ref 38–126)
Alkaline Phosphatase: 53 U/L (ref 38–126)
Anion gap: 11 (ref 5–15)
Anion gap: 10 (ref 5–15)
BUN: 12 mg/dL (ref 6–20)
BUN: 15 mg/dL (ref 6–20)
CO2: 27 mmol/L (ref 22–32)
CO2: 24 mmol/L (ref 22–32)
Calcium: 9 mg/dL (ref 8.9–10.3)
Calcium: 8.9 mg/dL (ref 8.9–10.3)
Chloride: 102 mmol/L (ref 98–111)
Chloride: 104 mmol/L (ref 98–111)
Creatinine, Ser: 0.82 mg/dL (ref 0.44–1.00)
Creatinine, Ser: 0.75 mg/dL (ref 0.44–1.00)
GFR calc Af Amer: >60 mL/min (ref >60)
GFR calc Af Amer: >60 mL/min (ref >60)
GFR calc non Af Amer: >60 mL/min (ref >60)
GFR calc non Af Amer: >60 mL/min (ref >60)
Glucose, Bld: 85 mg/dL (ref 70–99)
Glucose, Bld: 88 mg/dL (ref 70–99)
Potassium: 3.8 mmol/L (ref 3.5–5.1)
Potassium: 4 mmol/L (ref 3.5–5.1)
Sodium: 140 mmol/L (ref 135–145)
Sodium: 138 mmol/L (ref 135–145)
Total Bilirubin: 0.6 mg/dL (ref 0.3–1.2)
Total Bilirubin: 0.8 mg/dL (ref 0.3–1.2)
Total Protein: 6.9 g/dL (ref 6.5–8.1)
Total Protein: 7.1 g/dL (ref 6.5–8.1)

## 2019-06-14 LAB — I-STAT BETA HCG BLOOD, ED (MC, WL, AP ONLY): I-stat hCG, quantitative: <5 m[IU]/mL (ref <5)

## 2019-06-14 LAB — LIPASE, BLOOD
Lipase: 24 U/L (ref 11–51)
Lipase: 22 U/L (ref 11–51)

## 2019-06-14 LAB — CERVICOVAGINAL ANCILLARY ONLY
Chlamydia: NEGATIVE
Comment: NEGATIVE
Comment: NORMAL
Neisseria Gonorrhea: NEGATIVE

## 2019-06-14 LAB — POCT PREGNANCY, URINE: Preg Test, Ur: NEGATIVE

## 2019-06-14 LAB — TROPONIN I (HIGH SENSITIVITY): Troponin I (High Sensitivity): <2 ng/L (ref <18)

## 2019-06-14 MED ORDER — SODIUM CHLORIDE 0.9 % IV BOLUS
1000.0000 mL | Freq: Once | INTRAVENOUS | Status: AC
  Administered 2019-06-14: 1000 mL via INTRAVENOUS

## 2019-06-14 MED ORDER — IOHEXOL 300 MG/ML  SOLN
75.0000 mL | Freq: Once | INTRAMUSCULAR | Status: AC | PRN
  Administered 2019-06-14: 75 mL via INTRAVENOUS

## 2019-06-14 MED ORDER — ACETAMINOPHEN 500 MG PO TABS
1000.0000 mg | ORAL_TABLET | Freq: Once | ORAL | Status: AC
  Administered 2019-06-14: 1000 mg via ORAL
  Filled 2019-06-14: qty 2

## 2019-06-14 MED ORDER — DICYCLOMINE HCL 10 MG PO CAPS
10.0000 mg | ORAL_CAPSULE | Freq: Once | ORAL | Status: AC
  Administered 2019-06-14: 17:00:00 10 mg via ORAL
  Filled 2019-06-14: qty 1

## 2019-06-14 MED ORDER — ONDANSETRON HCL 4 MG/2ML IJ SOLN
4.0000 mg | Freq: Once | INTRAMUSCULAR | Status: AC
  Administered 2019-06-14: 17:00:00 4 mg via INTRAVENOUS
  Filled 2019-06-14: qty 2

## 2019-06-14 MED ORDER — SUCRALFATE 1 G PO TABS
1.0000 g | ORAL_TABLET | Freq: Once | ORAL | Status: AC
  Administered 2019-06-14: 17:00:00 1 g via ORAL
  Filled 2019-06-14: qty 1

## 2019-06-14 MED ORDER — PANTOPRAZOLE SODIUM 40 MG IV SOLR
40.0000 mg | Freq: Once | INTRAVENOUS | Status: AC
  Administered 2019-06-14: 17:00:00 40 mg via INTRAVENOUS
  Filled 2019-06-14: qty 40

## 2019-06-14 NOTE — ED Notes (Signed)
Pt denies nausea.

## 2019-06-14 NOTE — Telephone Encounter (Signed)
Spoke with pt regarding her symptoms. Pt stated she is not able to eat or drink anything due to the extreme pain. Questioned pt as to why she left the ER last night and she stated it was going to be a 7 hour wait before she would even be seen at [facility]. Advised pt due to the amount of pain she was experiencing, she needed to be evaluated in either the ER or urgent care as we cannot diagnosis her problem over the phone. Pt verbalized understanding and will head back over now.

## 2019-06-14 NOTE — Discharge Instructions (Addendum)
Your work-up was reassuring.  Can follow-up with Dr. Servando Snare  Return to the ER for any other concerns  MPRESSION: 1. Minimal formed stool in the colon mostly at the level of the rectal vault. May correlate with recent diarrheal illness. 2. Otherwise, no acute abdominopelvic abnormality to provide cause for patient's symptoms.

## 2019-06-14 NOTE — ED Notes (Signed)
Pt did not respond when called for vitals check 

## 2019-06-14 NOTE — ED Provider Notes (Signed)
[facility] Emergency Department Provider Note  ____________________________________________   First MD Initiated Contact with Patient 06/14/19 1601     (approximate)  I have reviewed the triage vital signs and the nursing notes.   HISTORY  Chief Complaint Abdominal Pain    HPI [name] is a 24 y.o. female with history of abdominal pain followed by GI who comes in with abdominal pain.  Patient states that she is had upper abdominal pain for 2 years now.  She is followed by GI.  She is had negative gallbladder ultrasound as well as EF functioning of her gallbladder.  Patient is followed by Dr. Servando Snare.  Patient states that the pain has been there intermittently for 2 years.  She states that it has been worse over the past few days and she came in due to worsening pain.  The pains in the upper abdomen, constant, occurs no matter what she eats except for fruit, has not been on any medications to help the pain.  Patient does have some associated loose stools and occasional vomiting.  Denies any shortness of breath or chest pain.          Past Medical History:  Diagnosis Date  . Abdominal pain   . Asthma   . History of hypertension   . Hypertension   . Nausea   . Peptic ulcer disease   . PONV (postoperative nausea and vomiting)   . Pre-diabetes (history)     Patient Active Problem List   Diagnosis Date Noted  . Increased BMI 05/11/2017  . Drug use 05/11/2017  . Tobacco user 01/11/2017  . History of C-section 11/29/2016  . Acquired acanthosis nigricans 11/11/2010  . Depressive disorder 07/19/2010  . Obesity (BMI 30.0-34.9) 07/19/2010  . Extrinsic asthma 04/03/2009    Past Surgical History:  Procedure Laterality Date  . CESAREAN SECTION N/A 11/29/2016   Procedure: CESAREAN SECTION;  Surgeon: Herold Harms, MD;  Location: [facility] ORS;  Service: Obstetrics;  Laterality: N/A;  . CESAREAN SECTION N/A 12/30/2017   Procedure: REPEAT CESAREAN  SECTION;  Surgeon: Hildred Laser, MD;  Location: [facility] ORS;  Service: Obstetrics;  Laterality: N/A;  . TONSILLECTOMY    . WISDOM TOOTH EXTRACTION      Prior to Admission medications   Medication Sig Start Date End Date Taking? Authorizing Provider  escitalopram (LEXAPRO) 10 MG tablet Take 1 tablet (10 mg total) by mouth at bedtime. 06/12/19   Hildred Laser, MD  lansoprazole (PREVACID) 30 MG capsule Take 1 capsule (30 mg total) by mouth daily at 12 noon. 06/12/19   Hildred Laser, MD  norelgestromin-ethinyl estradiol Burr Medico) 150-35 MCG/24HR transdermal patch Place 1 patch onto the skin once a week. 09/27/18   Hildred Laser, MD    Allergies Metformin and related and Sprintec 28 [norgestimate-eth estradiol]  Family History  Problem Relation Age of Onset  . Cholelithiasis Mother   . SIDS Brother   . Cholelithiasis Maternal Grandmother   . Ulcers Maternal Grandmother   . Diabetes Maternal Grandfather   . Celiac disease Neg Hx     Social History Social History   Tobacco Use  . Smoking status: Current Every Day Smoker    Packs/day: 0.25    Types: Cigarettes    Last attempt to quit: 03/24/2016    Years since quitting: 3.2  . Smokeless tobacco: Never Used  Substance Use Topics  . Alcohol use: No  . Drug use: Not Currently    Comment: Denies current use  Review of Systems Constitutional: No fever/chills Eyes: No visual changes. ENT: No sore throat. Cardiovascular: Denies chest pain. Respiratory: Denies shortness of breath. Gastrointestinal: Positive abdominal pain, vomiting, diarrhea Genitourinary: Negative for dysuria. Musculoskeletal: Negative for back pain. Skin: Negative for rash. Neurological: Negative for headaches, focal weakness or numbness. All other ROS negative ____________________________________________   PHYSICAL EXAM:  VITAL SIGNS: ED Triage Vitals  Enc Vitals Group     BP 06/14/19 1159 122/81     Pulse Rate 06/14/19 1159 76     Resp 06/14/19 1159  20     Temp 06/14/19 1159 98.1 F (36.7 C)     Temp Source 06/14/19 1159 Oral     SpO2 06/14/19 1159 100 %     Weight 06/14/19 1200 121 lb 14.6 oz (55.3 kg)     Height 06/14/19 1200 4\' 11"  (1.499 m)     Head Circumference --      Peak Flow --      Pain Score 06/14/19 1159 4     Pain Loc --      Pain Edu? --      Excl. in [place]? --     Constitutional: Alert and oriented. Well appearing and in no acute distress. Eyes: Conjunctivae are normal. EOMI. Head: Atraumatic. Nose: No congestion/rhinnorhea. Mouth/Throat: Mucous membranes are moist.   Neck: No stridor. Trachea Midline. FROM Cardiovascular: Normal rate, regular rhythm. Grossly normal heart sounds.  Good peripheral circulation. Respiratory: Normal respiratory effort.  No retractions. Lungs CTAB. Gastrointestinal: Soft but tender in the upper abdomen.  No distention. No abdominal bruits.  Musculoskeletal: No lower extremity tenderness nor edema.  No joint effusions. Neurologic:  Normal speech and language. No gross focal neurologic deficits are appreciated.  Skin:  Skin is warm, dry and intact. No rash noted. Psychiatric: Mood and affect are normal. Speech and behavior are normal. GU: Deferred   ____________________________________________   LABS (all labs ordered are listed, but only abnormal results are displayed)  Labs Reviewed  COMPREHENSIVE METABOLIC PANEL - Abnormal; Notable for the following components:      Result Value   AST 14 (*)    All other components within normal limits  URINALYSIS, COMPLETE (UACMP) WITH MICROSCOPIC - Abnormal; Notable for the following components:   Color, Urine YELLOW (*)    APPearance CLEAR (*)    Specific Gravity, Urine 1.032 (*)    Ketones, ur 5 (*)    Protein, ur 30 (*)    All other components within normal limits  GASTROINTESTINAL PANEL BY PCR, STOOL (REPLACES STOOL CULTURE)  C DIFFICILE QUICK SCREEN W PCR REFLEX  LIPASE, BLOOD  CBC  POCT PREGNANCY, URINE  POC URINE PREG, ED    TROPONIN I (HIGH SENSITIVITY)  TROPONIN I (HIGH SENSITIVITY)   ____________________________________________   ED ECG REPORT I, Vanessa Blackwater, the attending physician, personally viewed and interpreted this ECG.  EKG is sinus rate of 65, no ST elevation, no T wave inversions, normal intervals ____________________________________________  RADIOLOGY Robert Bellow, personally viewed and evaluated these images (plain radiographs) as part of my medical decision making, as well as reviewing the written report by the radiologist.  ED MD interpretation: X-ray no pneumonia or pneumothorax  Official radiology report(s): DG Chest 2 View  Result Date: 06/14/2019 CLINICAL DATA:  25 year old with an approximate 2 year history of recurrent postprandial BILATERAL upper quadrant abdominal pain associated with diarrhea. EXAM: CHEST - 2 VIEW COMPARISON:  06/05/2012 and earlier. FINDINGS: Cardiomediastinal silhouette unremarkable. Lungs clear. Bronchovascular  markings normal. Pulmonary vascularity normal. No pneumothorax. No pleural effusions. Visualized bony thorax intact. No interval change. IMPRESSION: Normal examination. Electronically Signed   By: Hulan Saas M.D.   On: 06/14/2019 18:06   CT ABDOMEN PELVIS W CONTRAST  Result Date: 06/14/2019 CLINICAL DATA:  Abdominal distension, upper abdominal pain, recurrent for the past 2 years, worsened by eating and drinking. Multiple bouts of diarrhea. EXAM: CT ABDOMEN AND PELVIS WITH CONTRAST TECHNIQUE: Multidetector CT imaging of the abdomen and pelvis was performed using the standard protocol following bolus administration of intravenous contrast. CONTRAST:  41mL OMNIPAQUE IOHEXOL 300 MG/ML  SOLN COMPARISON:  CT 05/17/2013 FINDINGS: Lower chest: Lung bases are clear. Normal heart size. No pericardial effusion. Hepatobiliary: No focal liver abnormality is seen. No gallstones, gallbladder wall thickening, or biliary dilatation. Pancreas: Unremarkable. No  pancreatic ductal dilatation or surrounding inflammatory changes. Spleen: Normal in size without focal abnormality. Adrenals/Urinary Tract: Adrenal glands are unremarkable. Kidneys are normal, without renal calculi, focal lesion, or hydronephrosis. Bladder is unremarkable. Stomach/Bowel: Distal esophagus, stomach and duodenal sweep are unremarkable. No small bowel wall thickening or dilatation. No evidence of obstruction. A normal appendix is visualized. No colonic dilatation or wall thickening. Minimal formed stool in the colon mostly at the level of the rectal vault. May correlate with recent diarrheal illness. Vascular/Lymphatic: The aorta is normal caliber. No suspicious or enlarged lymph nodes in the included lymphatic chains. Reproductive: Anteverted uterus. Radiolucent tampon noted within the vaginal canal. No concerning adnexal lesions. Other: No abdominopelvic free air or fluid. No bowel containing hernias. Musculoskeletal: Multilevel degenerative changes are present in the imaged portions of the spine. No acute osseous abnormality or suspicious osseous lesion. IMPRESSION: 1. Minimal formed stool in the colon mostly at the level of the rectal vault. May correlate with recent diarrheal illness. 2. Otherwise, no acute abdominopelvic abnormality to provide cause for patient's symptoms. Electronically Signed   By: Kreg Shropshire M.D.   On: 06/14/2019 17:11    ____________________________________________   PROCEDURES  Procedure(s) performed (including Critical Care):  Procedures   ____________________________________________   INITIAL IMPRESSION / ASSESSMENT AND PLAN / ED COURSE  Kailani Anastacio was evaluated in Emergency Department on 06/14/2019 for the symptoms described in the history of present illness. She was evaluated in the context of the global COVID-19 pandemic, which necessitated consideration that the patient might be at risk for infection with the SARS-CoV-2 virus that causes  COVID-19. Institutional protocols and algorithms that pertain to the evaluation of patients at risk for COVID-19 are in a state of rapid change based on information released by regulatory bodies including the CDC and federal and state organizations. These policies and algorithms were followed during the patient's care in the ED.    Patient is a 23 year old followed by GI for abdominal pain.  Patient had negative ultrasounds and gallbladder EF function test.  I reviewed Dr. Vilma Prader note who recommended CT imaging if patient's symptoms were continuing long-term.  Discussed with patient that we can proceed with CT imaging to make sure no evidence of mass, obstruction or other acute pathology.  Will give some symptom management in case this could be related to gastritis.  Patient denies any recent antibiotics to suggest C. difficile.  Will get labs to evaluate for electrolyte abnormalities, AKI.  No lower abdominal pain to suggest this being pelvic in origin.  Patient is not really feel short of breath but given the chest pain is in the upper abdomen mostly on the chest x-ray to make  sure is no pneumothorax or pneumonia.  Again she is not short of breath and her oxygen levels are normal and heart rate is normal so low suspicion for PE.  The risk factor is that she is on birth control but again patient's symptoms have been going on for 2 years and she is not short of breath so it seems to be unlikely to be PE in origin   Labs are reassuring.  No electrolyte abnormalities.  Pregnancy test is negative.  Chest x-ray was negative.  CT imaging was negative except for some stool in the colon that could be related to diarrheal illness.  Patient unable to give stool sample but denies any recent antibiotics to suggest C. Difficile.  Patient states that she is ready to be discharged home.  Patient reports that she does not feel any better although her nausea is better.  However when I will go to reevaluate her abdomen she  seems to be less tender upon palpation.  Patient states that she is going to call Dr. Annabell Sabal office tomorrow and does not want any other medications to go home with because she would rather just follow-up with him.    I discussed the provisional nature of ED diagnosis, the treatment so far, the ongoing plan of care, follow up appointments and return precautions with the patient and any family or support people present. They expressed understanding and agreed with the plan, discharged home.  ____________________________________________   FINAL CLINICAL IMPRESSION(S) / ED DIAGNOSES   Final diagnoses:  Epigastric pain      MEDICATIONS GIVEN DURING THIS VISIT:  Medications  pantoprazole (PROTONIX) injection 40 mg (40 mg Intravenous Given 06/14/19 1726)  sucralfate (CARAFATE) tablet 1 g (1 g Oral Given 06/14/19 1727)  dicyclomine (BENTYL) capsule 10 mg (10 mg Oral Given 06/14/19 1726)  ondansetron (ZOFRAN) injection 4 mg (4 mg Intravenous Given 06/14/19 1726)  acetaminophen (TYLENOL) tablet 1,000 mg (1,000 mg Oral Given 06/14/19 1726)  sodium chloride 0.9 % bolus 1,000 mL (1,000 mLs Intravenous New Bag/Given 06/14/19 1730)  iohexol (OMNIPAQUE) 300 MG/ML solution 75 mL (75 mLs Intravenous Contrast Given 06/14/19 1651)     ED Discharge Orders    None       Note:  This document was prepared using Dragon voice recognition software and may include unintentional dictation errors.   Concha Se, MD 06/14/19 [place]

## 2019-06-14 NOTE — Telephone Encounter (Signed)
Patient called & l/m on v/m stating she is having chest/abd/rib pain along with having sweats& chills. Patient has ask for a patient ASAP.

## 2019-06-14 NOTE — ED Triage Notes (Addendum)
Patient to upper abd pain bilaterally. Patient states pain has been recurrent for 2 years. Patient reports pain is worse with eating and drinking. Reports having diarrhea 2-3 times a day that has become her norm since abd pain started 2 years ago.

## 2019-06-14 NOTE — ED Notes (Signed)
Will give meds once pt back from imaging.

## 2019-06-14 NOTE — ED Notes (Signed)
EDP at bedside updating pt 

## 2019-07-02 ENCOUNTER — Ambulatory Visit: Payer: Medicaid Other | Admitting: Gastroenterology

## 2019-07-02 NOTE — Progress Notes (Deleted)
Primary Care Physician: [facility]  Primary Gastroenterologist:  Dr. Midge Minium  No chief complaint on file.   HPI: [name] is a 24 y.o. female here who has seen me in the past for recurrent abdominal pain.  The patient's abdominal pain has not been related to eating or drinking or any GI function.  The patient was determined to have musculoskeletal pain on a previous visit with clear exacerbation of the abdominal pain with flexion of the abdominal wall muscles.  The patient was in the emergency room on 415 of this year and at that time had a CT scan of the abdomen that showed:4  IMPRESSION: 1. Minimal formed stool in the colon mostly at the level of the rectal vault. May correlate with recent diarrheal illness. 2. Otherwise, no acute abdominopelvic abnormality to provide cause for patient's symptoms.  She had a normal CBC and her CMP did not show any elevation of her liver enzymes.  Past Medical History:  Diagnosis Date  . Abdominal pain   . Asthma   . History of hypertension   . Hypertension   . Nausea   . Peptic ulcer disease   . PONV (postoperative nausea and vomiting)   . Pre-diabetes (history)     Current Outpatient Medications  Medication Sig Dispense Refill  . escitalopram (LEXAPRO) 10 MG tablet Take 1 tablet (10 mg total) by mouth at bedtime. 90 tablet 0  . lansoprazole (PREVACID) 30 MG capsule Take 1 capsule (30 mg total) by mouth daily at 12 noon. 90 capsule 0  . norelgestromin-ethinyl estradiol [employer]) 150-35 MCG/24HR transdermal patch Place 1 patch onto the skin once a week. 3 patch 12   No current facility-administered medications for this visit.    Allergies as of 07/02/2019 - Review Complete 06/14/2019  Allergen Reaction Noted  . Metformin and related Shortness Of Breath 07/14/2011  . Sprintec 28 [norgestimate-eth estradiol] Hives 05/28/2013    ROS:  General: Negative for anorexia, weight loss, fever, chills,  fatigue, weakness. ENT: Negative for hoarseness, difficulty swallowing , nasal congestion. CV: Negative for chest pain, angina, palpitations, dyspnea on exertion, peripheral edema.  Respiratory: Negative for dyspnea at rest, dyspnea on exertion, cough, sputum, wheezing.  GI: See history of present illness. GU:  Negative for dysuria, hematuria, urinary incontinence, urinary frequency, nocturnal urination.  Endo: Negative for unusual weight change.    Physical Examination:   LMP 06/07/2019   General: Well-nourished, well-developed in no acute distress.  Eyes: No icterus. Conjunctivae pink. Lungs: Clear to auscultation bilaterally. Non-labored. Heart: Regular rate and rhythm, no murmurs rubs or gallops.  Abdomen: Bowel sounds are normal, nontender, nondistended, no hepatosplenomegaly or masses, no abdominal bruits or hernia , no rebound or guarding.   Extremities: No lower extremity edema. No clubbing or deformities. Neuro: Alert and oriented x 3.  Grossly intact. Skin: Warm and dry, no jaundice.   Psych: Alert and cooperative, normal mood and affect.  Labs:  ***  Imaging Studies: DG Chest 2 View  Result Date: 06/14/2019 CLINICAL DATA:  24 year old with an approximate 2 year history of recurrent postprandial BILATERAL upper quadrant abdominal pain associated with diarrhea. EXAM: CHEST - 2 VIEW COMPARISON:  06/05/2012 and earlier. FINDINGS: Cardiomediastinal silhouette unremarkable. Lungs clear. Bronchovascular markings normal. Pulmonary vascularity normal. No pneumothorax. No pleural effusions. Visualized bony thorax intact. No interval change. IMPRESSION: Normal examination. Electronically Signed   By: Hulan Saas M.D.   On: 06/14/2019 18:06   CT ABDOMEN PELVIS W CONTRAST  Result Date: 06/14/2019 CLINICAL DATA:  Abdominal distension, upper abdominal pain, recurrent for the past 2 years, worsened by eating and drinking. Multiple bouts of diarrhea. EXAM: CT ABDOMEN AND PELVIS WITH  CONTRAST TECHNIQUE: Multidetector CT imaging of the abdomen and pelvis was performed using the standard protocol following bolus administration of intravenous contrast. CONTRAST:  66mL OMNIPAQUE IOHEXOL 300 MG/ML  SOLN COMPARISON:  CT 05/17/2013 FINDINGS: Lower chest: Lung bases are clear. Normal heart size. No pericardial effusion. Hepatobiliary: No focal liver abnormality is seen. No gallstones, gallbladder wall thickening, or biliary dilatation. Pancreas: Unremarkable. No pancreatic ductal dilatation or surrounding inflammatory changes. Spleen: Normal in size without focal abnormality. Adrenals/Urinary Tract: Adrenal glands are unremarkable. Kidneys are normal, without renal calculi, focal lesion, or hydronephrosis. Bladder is unremarkable. Stomach/Bowel: Distal esophagus, stomach and duodenal sweep are unremarkable. No small bowel wall thickening or dilatation. No evidence of obstruction. A normal appendix is visualized. No colonic dilatation or wall thickening. Minimal formed stool in the colon mostly at the level of the rectal vault. May correlate with recent diarrheal illness. Vascular/Lymphatic: The aorta is normal caliber. No suspicious or enlarged lymph nodes in the included lymphatic chains. Reproductive: Anteverted uterus. Radiolucent tampon noted within the vaginal canal. No concerning adnexal lesions. Other: No abdominopelvic free air or fluid. No bowel containing hernias. Musculoskeletal: Multilevel degenerative changes are present in the imaged portions of the spine. No acute osseous abnormality or suspicious osseous lesion. IMPRESSION: 1. Minimal formed stool in the colon mostly at the level of the rectal vault. May correlate with recent diarrheal illness. 2. Otherwise, no acute abdominopelvic abnormality to provide cause for patient's symptoms. Electronically Signed   By: Lovena Le M.D.   On: 06/14/2019 17:11    Assessment and Plan:   [name] is a 65 y.o. y/o female  ***     Stephanie Lame, MD. Marval Regal    Note: This dictation was prepared with Dragon dictation along with smaller phrase technology. Any transcriptional errors that result from this process are unintentional.

## 2019-07-04 DIAGNOSIS — K8050 Calculus of bile duct without cholangitis or cholecystitis without obstruction: Secondary | ICD-10-CM | POA: Insufficient documentation

## 2019-07-09 NOTE — Progress Notes (Deleted)
Pt present today for follow up on Lexapro for anxiety and depression. Pt stated  PHQ-9= GAD-7=

## 2019-07-10 ENCOUNTER — Encounter: Payer: Medicaid Other | Admitting: Obstetrics and Gynecology

## 2019-07-10 HISTORY — PX: LAPAROSCOPIC CHOLECYSTECTOMY: SUR755

## 2019-07-12 ENCOUNTER — Ambulatory Visit: Payer: Medicaid Other | Admitting: Gastroenterology

## 2019-08-07 ENCOUNTER — Other Ambulatory Visit: Payer: Self-pay | Admitting: Obstetrics and Gynecology

## 2019-08-10 NOTE — Telephone Encounter (Signed)
Patient should have enough meds to last until July. Was supposed to have a 1 month f/u in May. Would recommend a visit (either in person or face to face within the next month to f/u with her symptoms (GI and depression symptoms). I see that she recently had surgery for her gallbladder at [facility].   Dr. Valentino Saxon

## 2019-09-26 ENCOUNTER — Other Ambulatory Visit: Payer: Self-pay | Admitting: Obstetrics and Gynecology

## 2019-10-11 ENCOUNTER — Other Ambulatory Visit: Payer: Self-pay | Admitting: Obstetrics and Gynecology

## 2019-10-11 NOTE — Telephone Encounter (Signed)
Pt was called due to receiveing a medication refill for Lexapo and pt canceled her follow up after appt.  Pt stated that she was confused and unsure which one of the medication was her Xulane birth control patches and called in the Lexapo pt stated that she do not need a refill of the Lexapo.

## 2020-01-01 ENCOUNTER — Telehealth: Payer: Self-pay

## 2020-01-01 ENCOUNTER — Other Ambulatory Visit: Payer: Self-pay

## 2020-01-01 DIAGNOSIS — Z111 Encounter for screening for respiratory tuberculosis: Secondary | ICD-10-CM

## 2020-01-01 NOTE — Telephone Encounter (Signed)
Order for TB has been placed.  Thanks [employer]

## 2020-01-01 NOTE — Telephone Encounter (Signed)
Pt called in and stated that her job is requesting her to take a TB TEST. The pt was told that an order has to be placed, I told the pt I will send a message and we made her a appointment for the test we made her appt with the nurse 11/15 at 10:15. Please advise

## 2020-01-14 ENCOUNTER — Other Ambulatory Visit: Payer: Medicaid Other

## 2020-01-14 ENCOUNTER — Other Ambulatory Visit: Payer: Self-pay

## 2020-01-14 DIAGNOSIS — Z111 Encounter for screening for respiratory tuberculosis: Secondary | ICD-10-CM

## 2020-01-16 LAB — QUANTIFERON-TB GOLD PLUS
QuantiFERON Mitogen Value: >10 IU/mL
QuantiFERON Nil Value: 0.19 IU/mL
QuantiFERON TB1 Ag Value: 0.16 IU/mL
QuantiFERON TB2 Ag Value: 0.11 IU/mL
QuantiFERON-TB Gold Plus: NEGATIVE

## 2020-02-07 ENCOUNTER — Telehealth: Payer: Self-pay

## 2020-02-07 NOTE — Telephone Encounter (Signed)
mychart sent.

## 2020-02-11 ENCOUNTER — Other Ambulatory Visit: Payer: Self-pay | Admitting: Obstetrics and Gynecology

## 2020-02-26 ENCOUNTER — Other Ambulatory Visit: Payer: Self-pay

## 2020-02-26 ENCOUNTER — Ambulatory Visit
Admission: EM | Admit: 2020-02-26 | Discharge: 2020-02-26 | Disposition: A | Discharge status: Home / Self Care | Payer: Medicaid Other | Attending: Emergency Medicine | Admitting: Emergency Medicine

## 2020-02-26 DIAGNOSIS — R059 Cough, unspecified: Secondary | ICD-10-CM | POA: Diagnosis present

## 2020-02-26 DIAGNOSIS — F17210 Nicotine dependence, cigarettes, uncomplicated: Secondary | ICD-10-CM | POA: Diagnosis not present

## 2020-02-26 DIAGNOSIS — Z20822 Contact with and (suspected) exposure to covid-19: Secondary | ICD-10-CM | POA: Insufficient documentation

## 2020-02-26 DIAGNOSIS — J069 Acute upper respiratory infection, unspecified: Secondary | ICD-10-CM | POA: Insufficient documentation

## 2020-02-26 DIAGNOSIS — J029 Acute pharyngitis, unspecified: Secondary | ICD-10-CM | POA: Diagnosis not present

## 2020-02-26 LAB — RESP PANEL BY RT-PCR (FLU A&B, COVID) ARPGX2
Influenza A by PCR: NEGATIVE
Influenza B by PCR: NEGATIVE
SARS Coronavirus 2 by RT PCR: NEGATIVE

## 2020-02-26 MED ORDER — BENZONATATE 100 MG PO CAPS: 200.0000 mg | ORAL_CAPSULE | Freq: Three times a day (TID) | ORAL | 0 refills | Status: DC

## 2020-02-26 MED ORDER — PROMETHAZINE-DM 6.25-15 MG/5ML PO SYRP: 5.0000 mL | ORAL_SOLUTION | Freq: Four times a day (QID) | ORAL | 0 refills | Status: DC | PRN

## 2020-02-26 NOTE — Discharge Instructions (Addendum)
You did not test positive for Covid or flu today.  Your symptoms are most likely the result of another upper respiratory virus.  Use Tylenol and ibuprofen over-the-counter as needed for pain and fever.  Use the Tessalon Perles every 8 hours during the day as needed for cough.  Take them with a small sip of water.  They may give you a numbness to the base of your tongue or a metallic taste in your mouth, this is normal.  Use the cough syrup at bedtime for cough, congestion, and sleep.  This medication will make you drowsy.  Perform sinus irrigation with a NeilMed sinus rinse kit and distilled water 2-3 times a day to help with nasal congestion.  Do not use tap water.  Return for reevaluation for any new or worsening symptoms, or see your primary care provider.

## 2020-02-26 NOTE — ED Provider Notes (Signed)
[facility]    CSN: [id number] Arrival date & time: 02/26/20  0955      History   Chief Complaint Chief Complaint  Patient presents with  . Cough    HPI [name] is a 24 y.o. female.   HPI   24 year old female here for evaluation of runny nose and nasal congestion that started 5 days ago and a productive cough that started 2 days ago.  Patient also reports that she has had a sore throat, ear pain or pressure, sinus pain, and body aches.  Patient denies fever, shortness breath or wheezing, GI complaints, changes to her taste or smell, or sick contacts.  Patient has not received her Covid vaccine or her flu shot.  Past Medical History:  Diagnosis Date  . Abdominal pain   . Asthma   . History of hypertension   . Hypertension   . Nausea   . Peptic ulcer disease   . PONV (postoperative nausea and vomiting)   . Pre-diabetes (history)     Patient Active Problem List   Diagnosis Date Noted  . Increased BMI 05/11/2017  . Drug use 05/11/2017  . Tobacco user 01/11/2017  . History of C-section 11/29/2016  . Acquired acanthosis nigricans 11/11/2010  . Depressive disorder 07/19/2010  . Obesity (BMI 30.0-34.9) 07/19/2010  . Extrinsic asthma 04/03/2009    Past Surgical History:  Procedure Laterality Date  . CESAREAN SECTION N/A 11/29/2016   Procedure: CESAREAN SECTION;  Surgeon: Brayton Mars, MD;  Location: [facility] ORS;  Service: Obstetrics;  Laterality: N/A;  . CESAREAN SECTION N/A 12/30/2017   Procedure: REPEAT CESAREAN SECTION;  Surgeon: [name], MD;  Location: [facility] ORS;  Service: Obstetrics;  Laterality: N/A;  . TONSILLECTOMY    . WISDOM TOOTH EXTRACTION      OB History    Gravida  4   Para  2   Term  2   Preterm      AB  2   Living  2     SAB  2   IAB      Ectopic      Multiple  0   Live Births  2            Home Medications    Prior to Admission medications   Medication Sig Start Date End Date Taking?  Authorizing Provider  benzonatate (TESSALON) 100 MG capsule Take 2 capsules (200 mg total) by mouth every 8 (eight) hours. 02/26/20  Yes Margarette Canada, NP  promethazine-dextromethorphan (PROMETHAZINE-DM) 6.25-15 MG/5ML syrup Take 5 mLs by mouth 4 (four) times daily as needed. 02/26/20  Yes Margarette Canada, NP  Marilu Favre 150-35 MCG/24HR transdermal patch APPLY 1 PATCH ONCE A WEEK 09/26/19  Yes [name], MD  lansoprazole (PREVACID) 30 MG capsule TAKE 1 CAPSULE (30 MG TOTAL) BY MOUTH DAILY AT 12 NOON. 02/11/20   [name], MD    Family History Family History  Problem Relation Age of Onset  . Cholelithiasis Mother   . SIDS Brother   . Cholelithiasis Maternal Grandmother   . Ulcers Maternal Grandmother   . Diabetes Maternal Grandfather   . Celiac disease Neg Hx     Social History Social History   Tobacco Use  . Smoking status: Current Every Day Smoker    Packs/day: 0.25    Types: Cigarettes    Last attempt to quit: 03/24/2016    Years since quitting: 3.9  . Smokeless tobacco: Never Used  Vaping Use  . Vaping Use: Never  used  Substance Use Topics  . Alcohol use: No  . Drug use: Not Currently    Comment: Denies current use      Allergies   Metformin and related and Sprintec 28 [norgestimate-eth estradiol]   Review of Systems Review of Systems  Constitutional: Negative for activity change, appetite change and fever.  HENT: Positive for congestion, ear pain, rhinorrhea, sinus pressure, sinus pain and sore throat.   Respiratory: Positive for cough. Negative for shortness of breath and wheezing.   Cardiovascular: Negative for chest pain.  Gastrointestinal: Negative for diarrhea, nausea and vomiting.  Musculoskeletal: Positive for arthralgias and myalgias.  Skin: Negative for rash.  Neurological: Negative for headaches.  Hematological: Negative.   Psychiatric/Behavioral: Negative.      Physical Exam Triage Vital Signs ED Triage Vitals  Enc Vitals Group     BP 02/26/20  1222 117/78     Pulse Rate 02/26/20 1222 94     Resp 02/26/20 1222 17     Temp 02/26/20 1222 98.3 F (36.8 C)     Temp Source 02/26/20 1222 Oral     SpO2 02/26/20 1222 98 %     Weight --      Height --      Head Circumference --      Peak Flow --      Pain Score 02/26/20 1217 2     Pain Loc --      Pain Edu? --      Excl. in [place]? --    No data found.  Updated Vital Signs BP 117/78 (BP Location: Left Arm)   Pulse 94   Temp 98.3 F (36.8 C) (Oral)   Resp 17   LMP 02/05/2020 (Approximate)   SpO2 98%   Visual Acuity Right Eye Distance:   Left Eye Distance:   Bilateral Distance:    Right Eye Near:   Left Eye Near:    Bilateral Near:     Physical Exam Vitals and nursing note reviewed.  Constitutional:      General: She is not in acute distress.    Appearance: Normal appearance. She is normal weight. She is not ill-appearing.  HENT:     Head: Normocephalic and atraumatic.     Right Ear: Tympanic membrane, ear canal and external ear normal.     Left Ear: Tympanic membrane, ear canal and external ear normal.     Nose: Congestion and rhinorrhea present.     Mouth/Throat:     Mouth: Mucous membranes are moist.     Pharynx: Posterior oropharyngeal erythema present. No oropharyngeal exudate.  Cardiovascular:     Rate and Rhythm: Normal rate and regular rhythm.     Pulses: Normal pulses.     Heart sounds: Normal heart sounds. No murmur heard. No gallop.   Pulmonary:     Effort: Pulmonary effort is normal.     Breath sounds: Normal breath sounds. No wheezing, rhonchi or rales.  Musculoskeletal:     Cervical back: Normal range of motion and neck supple.  Lymphadenopathy:     Cervical: No cervical adenopathy.  Skin:    General: Skin is warm and dry.     Capillary Refill: Capillary refill takes less than 2 seconds.     Findings: No erythema or rash.  Neurological:     General: No focal deficit present.     Mental Status: She is alert and oriented to person, place, and  time.  Psychiatric:  Mood and Affect: Mood normal.        Behavior: Behavior normal.        Thought Content: Thought content normal.        Judgment: Judgment normal.      UC Treatments / Results  Labs (all labs ordered are listed, but only abnormal results are displayed) Labs Reviewed  RESP PANEL BY RT-PCR (FLU A&B, COVID) [id number]    EKG   Radiology No results found.  Procedures Procedures (including critical care time)  Medications Ordered in UC Medications - No data to display  Initial Impression / Assessment and Plan / UC Course  I have reviewed the triage vital signs and the nursing notes.  Pertinent labs & imaging results that were available during my care of the patient were reviewed by me and considered in my medical decision making (see chart for details).   Patient is here for evaluation of respiratory complaints that have been going on for the past 5 days.  Patient's physical exam reveals a very pleasant, nontoxic-appearing 24 year old.  Patient does have mild erythema and edema of her bilateral nasal mucosa with yellow mucus.  Posterior oropharynx is erythematous with clear postnasal drip.  Tonsillar pillars unremarkable.  No cervical lymphadenopathy appreciated on exam.  Lungs are clear to auscultation in all fields.  Patient is unvaccinated against flu or Covid.  Will send respiratory triplex panel.  Respiratory panel is negative for Covid and flu.  Will discharge patient home with a diagnosis of upper respiratory infection.  Will give Tessalon Perles and Promethazine DM cough syrup for cough and congestion.  We will also instruct patient on sinus irrigation.   Final Clinical Impressions(s) / UC Diagnoses   Final diagnoses:  Viral URI with cough     Discharge Instructions     You did not test positive for Covid or flu today.  Your symptoms are most likely the result of another upper respiratory virus.  Use Tylenol and ibuprofen over-the-counter  as needed for pain and fever.  Use the Tessalon Perles every 8 hours during the day as needed for cough.  Take them with a small sip of water.  They may give you a numbness to the base of your tongue or a metallic taste in your mouth, this is normal.  Use the cough syrup at bedtime for cough, congestion, and sleep.  This medication will make you drowsy.  Perform sinus irrigation with a NeilMed sinus rinse kit and distilled water 2-3 times a day to help with nasal congestion.  Do not use tap water.  Return for reevaluation for any new or worsening symptoms, or see your primary care provider.    ED Prescriptions    Medication Sig Dispense Auth. Provider   benzonatate (TESSALON) 100 MG capsule Take 2 capsules (200 mg total) by mouth every 8 (eight) hours. 21 capsule Margarette Canada, NP   promethazine-dextromethorphan (PROMETHAZINE-DM) 6.25-15 MG/5ML syrup Take 5 mLs by mouth 4 (four) times daily as needed. 118 mL Margarette Canada, NP     PDMP not reviewed this encounter.   Margarette Canada, NP 02/26/20 1318

## 2020-02-26 NOTE — ED Triage Notes (Signed)
Pt c/o congestion, runny nose onset Thursday with yellow/green secretions-now clear secretions. Reports onset productive cough with green sputum and body aches on Sunday.  Denies n/v/d, fever, SOB, sore throat, ear pain.  Lungs CTA bilaterally.  Was taking robitussin for several days, then stopped.   No tylenol or ibuprofen taken.

## 2020-03-10 ENCOUNTER — Other Ambulatory Visit: Payer: Self-pay

## 2020-03-10 ENCOUNTER — Encounter: Payer: Self-pay | Admitting: Emergency Medicine

## 2020-03-10 ENCOUNTER — Ambulatory Visit
Admission: EM | Admit: 2020-03-10 | Discharge: 2020-03-10 | Disposition: A | Discharge status: Home / Self Care | Payer: Medicaid Other | Attending: Family Medicine | Admitting: Family Medicine

## 2020-03-10 DIAGNOSIS — Z20822 Contact with and (suspected) exposure to covid-19: Secondary | ICD-10-CM | POA: Insufficient documentation

## 2020-03-10 DIAGNOSIS — B9789 Other viral agents as the cause of diseases classified elsewhere: Secondary | ICD-10-CM | POA: Diagnosis not present

## 2020-03-10 DIAGNOSIS — J988 Other specified respiratory disorders: Secondary | ICD-10-CM | POA: Insufficient documentation

## 2020-03-10 MED ORDER — KETOROLAC TROMETHAMINE 10 MG PO TABS: 10.0000 mg | ORAL_TABLET | Freq: Four times a day (QID) | ORAL | 0 refills | Status: DC | PRN

## 2020-03-10 MED ORDER — ONDANSETRON 4 MG PO TBDP: 4.0000 mg | ORAL_TABLET | Freq: Three times a day (TID) | ORAL | 0 refills | Status: DC | PRN

## 2020-03-10 MED ORDER — IPRATROPIUM BROMIDE 0.06 % NA SOLN: 2.0000 | Freq: Four times a day (QID) | NASAL | 0 refills | Status: DC | PRN

## 2020-03-10 NOTE — Discharge Instructions (Signed)
Medication as prescribed.  Stay home.  Check my chart for COVID test results.  Take care  Dr. Tierany Appleby   

## 2020-03-10 NOTE — ED Provider Notes (Signed)
[facility]    CSN: [id number] Arrival date & time: 03/10/20  1239      History   Chief Complaint Chief Complaint  Patient presents with  . Cough  . Nasal Congestion  . Generalized Body Aches        HPI   25 year old female presents with respiratory symptoms.  Started on Saturday.  She reports cough, congestion, body aches, nausea.  She reports that her symptoms are severe.  Pain 5/10 in severity.  No relieving factors.  She is taken some over-the-counter medication without relief.  Desires Covid testing today.  No other complaints or concerns at this time.  Past Medical History:  Diagnosis Date  . Abdominal pain   . Asthma   . History of hypertension   . Hypertension   . Nausea   . Peptic ulcer disease   . PONV (postoperative nausea and vomiting)   . Pre-diabetes (history)     Patient Active Problem List   Diagnosis Date Noted  . Increased BMI 05/11/2017  . Drug use 05/11/2017  . Tobacco user 01/11/2017  . History of C-section 11/29/2016  . Acquired acanthosis nigricans 11/11/2010  . Depressive disorder 07/19/2010  . Obesity (BMI 30.0-34.9) 07/19/2010  . Extrinsic asthma 04/03/2009    Past Surgical History:  Procedure Laterality Date  . CESAREAN SECTION N/A 11/29/2016   Procedure: CESAREAN SECTION;  Surgeon: Herold Harms, MD;  Location: [facility] ORS;  Service: Obstetrics;  Laterality: N/A;  . CESAREAN SECTION N/A 12/30/2017   Procedure: REPEAT CESAREAN SECTION;  Surgeon: Hildred Laser, MD;  Location: [facility] ORS;  Service: Obstetrics;  Laterality: N/A;  . CHOLECYSTECTOMY    . TONSILLECTOMY    . WISDOM TOOTH EXTRACTION      OB History    Gravida  4   Para  2   Term  2   Preterm      AB  2   Living  2     SAB  2   IAB      Ectopic      Multiple  0   Live Births  2            Home Medications    Prior to Admission medications   Medication Sig Start Date End Date Taking? Authorizing Provider  ipratropium (ATROVENT)  0.06 % nasal spray Place 2 sprays into both nostrils 4 (four) times daily as needed for rhinitis. 03/10/20  Yes Karra Pink G, DO  ketorolac (TORADOL) 10 MG tablet Take 1 tablet (10 mg total) by mouth every 6 (six) hours as needed for moderate pain or severe pain. 03/10/20  Yes Toney Lizaola G, DO  ondansetron (ZOFRAN ODT) 4 MG disintegrating tablet Take 1 tablet (4 mg total) by mouth every 8 (eight) hours as needed for nausea or vomiting. 03/10/20  Yes Kesia Dalto G, DO  lansoprazole (PREVACID) 30 MG capsule TAKE 1 CAPSULE (30 MG TOTAL) BY MOUTH DAILY AT 12 NOON. 02/11/20 03/10/20  Hildred Laser, MD  Burr Medico 150-35 MCG/24HR transdermal patch APPLY 1 PATCH ONCE A WEEK 09/26/19 03/10/20  Hildred Laser, MD    Family History Family History  Problem Relation Age of Onset  . Cholelithiasis Mother   . SIDS Brother   . Cholelithiasis Maternal Grandmother   . Ulcers Maternal Grandmother   . Diabetes Maternal Grandfather   . Celiac disease Neg Hx     Social History Social History   Tobacco Use  . Smoking status: Current Every Day Smoker  Packs/day: 0.25    Types: Cigarettes    Last attempt to quit: 03/24/2016    Years since quitting: 3.9  . Smokeless tobacco: Never Used  Vaping Use  . Vaping Use: Never used  Substance Use Topics  . Alcohol use: No  . Drug use: Not Currently    Comment: Denies current use      Allergies   Metformin and related and Sprintec 28 [norgestimate-eth estradiol]   Review of Systems Review of Systems Per HPI  Physical Exam Triage Vital Signs ED Triage Vitals  Enc Vitals Group     BP 03/10/20 1454 114/78     Pulse Rate 03/10/20 1454 (!) 105     Resp 03/10/20 1454 18     Temp 03/10/20 1454 98.5 F (36.9 C)     Temp Source 03/10/20 1454 Oral     SpO2 03/10/20 1454 100 %     Weight 03/10/20 1455 121 lb 14.6 oz (55.3 kg)     Height 03/10/20 1455 4\' 11"  (1.499 m)     Head Circumference --      Peak Flow --      Pain Score 03/10/20 1453 5     Pain Loc --       Pain Edu? --      Excl. in [place]? --    Updated Vital Signs BP 114/78 (BP Location: Right Arm)   Pulse (!) 105   Temp 98.5 F (36.9 C) (Oral)   Resp 18   Ht 4\' 11"  (1.499 m)   Wt 55.3 kg   LMP 03/03/2020   SpO2 100%   BMI 24.62 kg/m   Visual Acuity Right Eye Distance:   Left Eye Distance:   Bilateral Distance:    Right Eye Near:   Left Eye Near:    Bilateral Near:     Physical Exam Vitals and nursing note reviewed.  Constitutional:      General: She is not in acute distress. HENT:     Head: Normocephalic and atraumatic.     Mouth/Throat:     Pharynx: Oropharynx is clear. No oropharyngeal exudate.  Eyes:     General:        Right eye: No discharge.        Left eye: No discharge.     Conjunctiva/sclera: Conjunctivae normal.  Cardiovascular:     Rate and Rhythm: Normal rate and regular rhythm.     Heart sounds: No murmur heard.   Pulmonary:     Effort: Pulmonary effort is normal.     Breath sounds: Normal breath sounds. No wheezing, rhonchi or rales.  Neurological:     Mental Status: She is alert.    UC Treatments / Results  Labs (all labs ordered are listed, but only abnormal results are displayed) Labs Reviewed  SARS CORONAVIRUS 2 (TAT 6-24 HRS)    EKG   Radiology No results found.  Procedures Procedures (including critical care time)  Medications Ordered in UC Medications - No data to display  Initial Impression / Assessment and Plan / UC Course  I have reviewed the triage vital signs and the nursing notes.  Pertinent labs & imaging results that were available during my care of the patient were reviewed by me and considered in my medical decision making (see chart for details).    25 year old female presents with a viral respiratory infection.  Suspected COVID-19.  Awaiting test results.  Toradol for body aches.  Atrovent nasal spray for congestion.  Zofran for  nausea.  Supportive care.  Final Clinical Impressions(s) / UC Diagnoses    Final diagnoses:  Viral respiratory infection  Suspected COVID-19 virus infection     Discharge Instructions     Medication as prescribed.  Stay home.  Check my chart for COVID test results.  Take care  Dr. Adriana Simas     ED Prescriptions    Medication Sig Dispense Auth. Provider   ketorolac (TORADOL) 10 MG tablet Take 1 tablet (10 mg total) by mouth every 6 (six) hours as needed for moderate pain or severe pain. 20 tablet Camren Lipsett G, DO   ipratropium (ATROVENT) 0.06 % nasal spray Place 2 sprays into both nostrils 4 (four) times daily as needed for rhinitis. 15 mL Arya Boxley G, DO   ondansetron (ZOFRAN ODT) 4 MG disintegrating tablet Take 1 tablet (4 mg total) by mouth every 8 (eight) hours as needed for nausea or vomiting. 20 tablet Tommie Sams, DO     PDMP not reviewed this encounter.   Tommie Sams, [place] 03/10/20 1628

## 2020-03-10 NOTE — ED Triage Notes (Signed)
Patient c/o cough, nasal congestion, body aches, nausea that started Saturday.

## 2020-03-11 LAB — SARS CORONAVIRUS 2 (TAT 6-24 HRS): SARS Coronavirus 2: NEGATIVE

## 2020-04-01 ENCOUNTER — Other Ambulatory Visit: Payer: Self-pay | Admitting: Family Medicine

## 2020-04-23 ENCOUNTER — Ambulatory Visit: Payer: Medicaid Other | Admitting: Neurology

## 2020-04-23 ENCOUNTER — Encounter: Payer: Self-pay | Admitting: Neurology

## 2020-04-23 VITALS — BP 118/75 | Ht 59.0 in | Wt 117.8 lb

## 2020-04-23 DIAGNOSIS — R55 Syncope and collapse: Secondary | ICD-10-CM

## 2020-04-23 DIAGNOSIS — R6889 Other general symptoms and signs: Secondary | ICD-10-CM

## 2020-04-23 DIAGNOSIS — F0781 Postconcussional syndrome: Secondary | ICD-10-CM

## 2020-04-23 NOTE — Progress Notes (Signed)
[facility] [address] [place]. [place] [address] [phone]       OFFICE CONSULT NOTE  Ms. [name] Date of Birth:  [birth year] Medical Record Number:  [id number]   Referring MD:  Tommie Sams  Reason for Referral: Postconcussion syndrome  HPI: Ms. Colton is a 25 year old Caucasian lady seen today for initial office consultation visit.  She is accompanied by her fianc.  History is obtained from patient and review of electronic medical records in Care Everywhere.  Imaging results available in PACS for review.  She has past medical history of peptic ulcer disease anastomotic and prediabetes.  She states on 03/11/2020 when she was suffering from flulike illness and had previously been seen in the urgent care a few days ago.  She was tested for Covid it was negative.  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  She was out with her husband and children when she suddenly felt hot dizzy lightheaded.  She tried to sit down and she fell and hit her head on the wall.  Husband did not see the actual fall but found her on the ground briefly unresponsive.  When she stroke he shook her she started opening her eyes within few seconds.  She appeared disoriented confused and this lasted about an hour and a half or so by the time they were seen in emergency room at [facility].  She had CT scan of the head as well as cervical spine both of which were unremarkable.  Patient complained of some headache and neck pain and soreness.  Since then the symptoms have been intermittent she is also complaining of intermittent ear pressure off and on she feels this is more related when her sugars dropped to the 50s and 60s.  She also has neck pain and soreness which start from the top of the neck and goes into her shoulders.  She takes Excedrin off-and-on which seems to help.  Bad headaches occur once a week or so.  She also has other  complaints of increased forgetfulness, decreased concentration and trouble with multitasking.  She is overall become slower in doing everything.  She also feels her mood is depressed and she has less energy and she is tired.  She also wants to sleep all the time.  She states she has had a few episodes which she calls a staring spells where she is just not responsive to the surroundings and to anybody she just sitting and staring ahead.  She feels she does not hear others talking.  After this episode she feels quite sleepy and wants to go to sleep.  There have been no eyewitnesses to these episodes and it is unclear if she has any twitchings on the face or seizure-like activity.  She has no prior history of seizures, febrile seizures as a child, family history of epilepsy.  There is no prior history of strokes TIA or migraines.  She works full-time at a daycare and has her own 2 young kids aged 2 and 3.  ROS:   14 system review of systems is positive for passing out, neck pain, headache, altered awareness, dizziness, decreased concentration and all other systems negative  PMH:  Past Medical History:  Diagnosis Date  . Abdominal pain   . Asthma   . History of hypertension   . Hypertension   . Nausea   . Peptic ulcer disease   . PONV (postoperative nausea  and vomiting)   . Pre-diabetes (history)     Social History:  Social History   Socioeconomic History  . Marital status: Single    Spouse name: Not on file  . Number of children: Not on file  . Years of education: Not on file  . Highest education level: Not on file  Occupational History  . Occupation: full time  Tobacco Use  . Smoking status: Current Every Day Smoker    Packs/day: 0.50    Types: Cigarettes  . Smokeless tobacco: Never Used  Vaping Use  . Vaping Use: Never used  Substance and Sexual Activity  . Alcohol use: No  . Drug use: Not Currently    Comment: Denies current use   . Sexual activity: Yes    Partners: Male     Birth control/protection: None, Patch    Comment: Nexplanon   Other Topics Concern  . Not on file  Social History Narrative   Lives with her kids and parents   Right Handed   Drinks 1-2 cups caffeine daily   Social Determinants of Health   Financial Resource Strain: Not on file  Food Insecurity: Not on file  Transportation Needs: Not on file  Physical Activity: Not on file  Stress: Not on file  Social Connections: Not on file  Intimate Partner Violence: Not on file    Medications:   Current Outpatient Medications on File Prior to Visit  Medication Sig Dispense Refill  . norelgestromin-ethinyl estradiol (ORTHO EVRA) 150-35 MCG/24HR transdermal patch Place 1 patch onto the skin once a week.    Marland Kitchen ipratropium (ATROVENT) 0.06 % nasal spray Place 2 sprays into both nostrils 4 (four) times daily as needed for rhinitis. 15 mL 0  . ketorolac (TORADOL) 10 MG tablet Take 1 tablet (10 mg total) by mouth every 6 (six) hours as needed for moderate pain or severe pain. 20 tablet 0  . ondansetron (ZOFRAN ODT) 4 MG disintegrating tablet Take 1 tablet (4 mg total) by mouth every 8 (eight) hours as needed for nausea or vomiting. 20 tablet 0  . [DISCONTINUED] lansoprazole (PREVACID) 30 MG capsule TAKE 1 CAPSULE (30 MG TOTAL) BY MOUTH DAILY AT 12 NOON. 90 capsule 1   No current facility-administered medications on file prior to visit.    Allergies:   Allergies  Allergen Reactions  . Metformin And Related Shortness Of Breath  . Sprintec 28 [Norgestimate-Eth Estradiol] Hives    Physical Exam General: well developed, well nourished petite young Caucasian lady, seated, in no evident distress Head: head normocephalic and atraumatic.   Neck: supple with no carotid or supraclavicular bruits Cardiovascular: regular rate and rhythm, no murmurs Musculoskeletal: no deformity Skin:  no rash/petichiae Vascular:  Normal pulses all extremities  Neurologic Exam Mental Status: Awake and fully alert.  Oriented to place and time. Recent and remote memory intact. Attention span, concentration and fund of knowledge appropriate. Mood and affect appropriate.  Cranial Nerves: Fundoscopic exam reveals sharp disc margins. Pupils equal, briskly reactive to light. Extraocular movements full without nystagmus. Visual fields full to confrontation. Hearing intact. Facial sensation intact. Face, tongue, palate moves normally and symmetrically.  Motor: Normal bulk and tone. Normal strength in all tested extremity muscles. Sensory.: intact to touch , pinprick , position and vibratory sensation.  Coordination: Rapid alternating movements normal in all extremities. Finger-to-nose and heel-to-shin performed accurately bilaterally. Gait and Station: Arises from chair without difficulty. Stance is normal. Gait demonstrates normal stride length and balance . Able to heel, toe  and tandem walk without difficulty.  Reflexes: 1+ and symmetric. Toes downgoing.       ASSESSMENT: 25 year old lady with multifocal symptoms of headache, decreased concentration, memory and altered awareness following minor head injury, syncopal event likely postconcussion syndrome.     PLAN: I had a long discussion with the patient and her fianc about her episode of brief loss of consciousness possibly representing a syncopal event and her multifocal symptoms after that likely suggestive of postconcussion syndrome.  I expect the symptoms to gradually improve.  I recommend further evaluation by checking MRI scan of the brain and MRA of the brain and neck and EEG.  I also recommend she do regular neck stretching exercises to help with her headache and neck soreness.  She will return for follow-up in the future in 2 months or call earlier if necessary.  Greater than 50% time during this 45-minute consultation visit was spent on counseling and coordination of care about her syncopal event and concussion and postconcussion's symptoms and answering  questions. Delia Heady, MD Medical Director [facility] Pager: [phone] 04/23/2020 9:19 AM  Note: This document was prepared with digital dictation and possible smart phrase technology. Any transcriptional errors that result from this process are unintentional.

## 2020-04-23 NOTE — Patient Instructions (Signed)
I had a long discussion with the patient and her fianc about her episode of brief loss of consciousness possibly representing a syncopal event and her multifocal symptoms after that likely suggestive of postconcussion syndrome.  I expect the symptoms to gradually improve.  I recommend further evaluation by checking MRI scan of the brain and MRA of the brain and neck and EEG.  I also recommend she do regular neck stretching exercises to help with her headache and neck soreness.  She will return for follow-up in the future in 2 months or call earlier if necessary.  Concussion, Adult  A concussion is a brain injury from a hard, direct hit (trauma) to your head or body. This direct hit causes your brain to quickly shake back and forth inside your skull. A concussion may also be called a mild traumatic brain injury (TBI). Healing from this injury can take time. What are the causes? This condition is caused by:  A direct hit to your head, such as: ? Running into a player during a game. ? Being hit in a fight. ? Hitting your head on a hard surface.  A quick and sudden movement of the head or neck, such as in a car crash. What are the signs or symptoms? The signs of a concussion can be hard to notice. They may be missed by you, family members, and doctors. You may look fine on the outside but may not act or feel normal. Physical symptoms  Headaches.  Being dizzy.  Problems with body balance.  Being sensitive to light or noise.  Vomiting or feeling like you may vomit.  Being tired.  Problems seeing or hearing.  Not sleeping or eating as you used to.  Seizure. Mental and emotional symptoms  Feeling grouchy (irritable).  Having mood changes.  Problems remembering things.  Trouble focusing your mind (concentrating), organizing, or making decisions.  Being slow to think, act, react, speak, or read.  Feeling worried or nervous (anxious).  Feeling sad (depressed). How is this  treated? This condition may be treated by:  Stopping sports or activity if you are injured. If you hit your head or have signs of concussion: ? Do not return to sports or activities the same day. ? Get checked by a doctor before you return to your activities.  Resting your body and your mind.  Being watched carefully, often at home.  Medicines to help with symptoms such as: ? Headaches. ? Feeling like you may vomit. ? Problems with sleep.  Avoiding alcohol and drugs.  Being asked to go to a concussion clinic or a place to help you recover (rehabilitation center). Recovery from a concussion can take time. Return to activities only:  When you are fully healed.  When your doctor says it is safe. Avoid taking strong pain medicines (opioids) for a concussion. Follow these instructions at home: Activity  Limit activities that need a lot of thought or focus, such as: ? Homework or work for your job. ? Watching TV. ? Using the computer or phone. ? Playing memory games and puzzles.  Rest. Rest helps your brain heal. Make sure you: ? Get plenty of sleep. Most adults should get 7-9 hours of sleep each night. ? Rest during the day. Take naps or breaks when you feel tired.  Avoid activity like exercise until your doctor says its safe. Stop any activity that makes symptoms worse.  Do not do activities that could cause a second concussion, such as riding a bike  or playing sports.  Ask your doctor when you can return to your normal activities, such as school, work, sports, and driving. Your ability to react may be slower. Do not do these activities if you are dizzy. General instructions  Take over-the-counter and prescription medicines only as told by your doctor.  Do not drink alcohol until your doctor says you can.  Watch your symptoms and tell other people to do the same. Other problems can occur after a concussion. Older adults have a higher risk of serious problems.  Tell your  work Production designer, theatre/television/film, teachers, Tax adviser, school counselor, coach, or Event organiser about your injury and symptoms. Tell them about what you can or cannot do.  Keep all follow-up visits as told by your doctor. This is important.   How is this prevented? It is very important that you do not get another brain injury. In rare cases, another injury can cause brain damage that will not go away, brain swelling, or death. The risk of this is greatest in the first 7-10 days after a head injury. To avoid injuries:  Stop activities that could lead to a second concussion, such as contact sports, until your doctor says it is okay.  When you return to sports or activities: ? Do not crash into other players. This is how most concussions happen. ? Follow the rules. ? Respect other players. Do not engage in violent behavior while playing.  Get regular exercise. Do strength and balance training.  Wear a helmet that fits you well during sports, biking, or other activities.  Helmets can help protect you from serious skull and brain injuries, but they do not protect you from a concussion. Even when wearing a helmet, you should avoid being hit in the head. Contact a doctor if:  Your symptoms do not get better.  You have new symptoms.  You have another injury. Get help right away if:  You have bad headaches or your headaches get worse.  You feel weak or numb in any part of your body.  You feel mixed up (confused).  Your balance gets worse.  You vomit often.  You feel more sleepy than normal.  You cannot speak well, or have slurred speech.  You have a seizure.  Others have trouble waking you up.  You have changes in how you act.  You have changes in how you see (vision).  You pass out (lose consciousness). These symptoms may be an emergency. Do not wait to see if the symptoms will go away. Get medical help right away. Call your local emergency services (911 in the [place] Do not drive  yourself to the hospital. Summary  A concussion is a brain injury from a hard, direct hit (trauma) to your head or body.  This condition is treated with rest and careful watching of symptoms.  Ask your doctor when you can return to your normal activities, such as school, work, or driving.  Get help right away if you have a very bad headache, feel weak in any part of your body, have a seizure, have changes in how you act or see, or if you are mixed up or more sleepy than normal. This information is not intended to replace advice given to you by your health care provider. Make sure you discuss any questions you have with your health care provider. Document Revised: 12/28/2018 Document Reviewed: 12/28/2018 Elsevier Patient Education  2021 [employer].

## 2020-04-23 NOTE — Progress Notes (Signed)
MMSE completed: 28/30 Depression scale 2/15

## 2020-04-24 ENCOUNTER — Telehealth: Payer: Self-pay | Admitting: Neurology

## 2020-04-24 NOTE — Telephone Encounter (Signed)
for [place] mcd healthy blue pending can take up to 15 business days

## 2020-04-28 NOTE — Telephone Encounter (Signed)
Checked the status on the portal it is still pending.  

## 2020-05-05 NOTE — Telephone Encounter (Signed)
Checked the status on the portal it is pending.  

## 2020-05-07 NOTE — Telephone Encounter (Signed)
mcd healthy blue [name]: [id number] (exp. 04/24/20 to 05/29/20) patient is scheduled at [facility] for Thursday 05/22/20 to [place] 4:[facility] [place] [facility]. Patient is aware of time and day. I also gave her their number of [phone] incase she needed to r/s.

## 2020-05-14 ENCOUNTER — Other Ambulatory Visit: Payer: Self-pay

## 2020-05-14 ENCOUNTER — Ambulatory Visit: Payer: Medicaid Other | Admitting: Neurology

## 2020-05-14 DIAGNOSIS — R55 Syncope and collapse: Secondary | ICD-10-CM

## 2020-05-14 DIAGNOSIS — R6889 Other general symptoms and signs: Secondary | ICD-10-CM

## 2020-05-20 ENCOUNTER — Other Ambulatory Visit: Payer: Self-pay | Admitting: Neurology

## 2020-05-20 ENCOUNTER — Telehealth: Payer: Self-pay | Admitting: Neurology

## 2020-05-20 DIAGNOSIS — F419 Anxiety disorder, unspecified: Secondary | ICD-10-CM

## 2020-05-20 MED ORDER — ALPRAZOLAM 0.25 MG PO TABS: 0.2500 mg | ORAL_TABLET | Freq: Once | ORAL | 0 refills | Status: AC

## 2020-05-20 NOTE — Telephone Encounter (Signed)
Pt called stating that she is having an MRI done on 3/24 and she is claustrophobic and is needing a prescription called in for her to the [facility] on [address]. Pt states she has to be at the MRI location by 4:30pm. Please advise.

## 2020-05-20 NOTE — Telephone Encounter (Signed)
Called pt back. Relayed Dr. Pearlean Brownie called in rx xanax for her to take prior to MRI. I went over directions. She will have driver to and from test.

## 2020-05-20 NOTE — Telephone Encounter (Signed)
done

## 2020-05-22 ENCOUNTER — Ambulatory Visit
Admission: RE | Admit: 2020-05-22 | Discharge: 2020-05-22 | Disposition: A | Discharge status: Home / Self Care | Payer: Medicaid Other | Source: Ambulatory Visit | Attending: Neurology | Admitting: Neurology

## 2020-05-22 ENCOUNTER — Other Ambulatory Visit: Payer: Medicaid Other

## 2020-05-22 ENCOUNTER — Other Ambulatory Visit: Payer: Self-pay

## 2020-05-22 DIAGNOSIS — R55 Syncope and collapse: Secondary | ICD-10-CM | POA: Insufficient documentation

## 2020-05-22 DIAGNOSIS — R6889 Other general symptoms and signs: Secondary | ICD-10-CM | POA: Diagnosis not present

## 2020-05-22 MED ORDER — GADOBUTROL 1 MMOL/ML IV SOLN
5.0000 mL | Freq: Once | INTRAVENOUS | Status: AC | PRN
  Administered 2020-05-22: 5 mL via INTRAVENOUS

## 2020-05-23 NOTE — Progress Notes (Signed)
Kindly inform the patient that MRI scan of the brain as well as MR angiogram study of the brain and the neck blood vessels was all satisfactory.  Nothing to worry about

## 2020-05-23 NOTE — Progress Notes (Signed)
Kindly inform the patient that EEG study was normal

## 2020-05-26 ENCOUNTER — Telehealth: Payer: Self-pay

## 2020-05-26 NOTE — Telephone Encounter (Signed)
I called the pt and advised of Dr. Marlis Edelson Results  Micki Riley, MD  05/23/2020 3:19 PM EDT Back to Top     Kindly inform the patient that MRI scan of the brain as well as MR angiogram study of the brain and the neck blood vessels was all satisfactory. Nothing to worry about    Micki Riley, MD  05/23/2020 3:17 PM EDT Back to Top     Kindly inform the patient that EEG study was normal   Pt verbalized understanding and had no questions/concerns.

## 2020-06-13 ENCOUNTER — Ambulatory Visit: Payer: Medicaid Other | Admitting: Neurology

## 2020-07-08 IMAGING — NM NUCLEAR MEDICINE HEPATOBILIARY IMAGING WITH GALLBLADDER EF
2 series · 12 of 12 positions shown · non-contrast
Comparison: 06/12/2013

CLINICAL DATA: Right upper quadrant pain.

EXAM:
NUCLEAR MEDICINE HEPATOBILIARY IMAGING WITH GALLBLADDER EF
TECHNIQUE: Sequential images of the abdomen were obtained [DATE] minutes
following intravenous administration of radiopharmaceutical. After
oral ingestion of Ensure, gallbladder ejection fraction was
determined. At 60 min, normal ejection fraction is greater than 33%.
RADIOPHARMACEUTICALS:  5.0 mCi Uc-YYm  Choletec IV

[Series 1000: gallbladder ef · 4.80mm/px · 6 of 120 frames shown]
[frame 11/120]
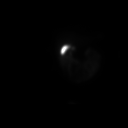
[frame 31/120]
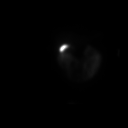
[frame 51/120]
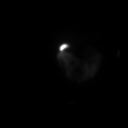
[frame 71/120]
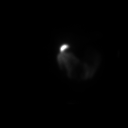
[frame 91/120]
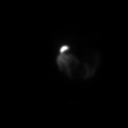
[frame 111/120]
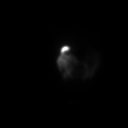

[Series 1000: hepatobiliary scan · 9.59mm/px · 6 of 60 frames shown]
[frame 6/60]
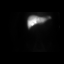
[frame 16/60]
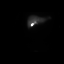
[frame 26/60]
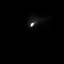
[frame 36/60]
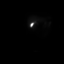
[frame 46/60]
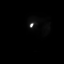
[frame 56/60]
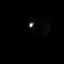

[12 of 12 positions shown; findings below may reference images not displayed]

FINDINGS: Prompt uptake and biliary excretion of activity by the liver is
seen. Gallbladder activity is visualized, consistent with patency of
cystic duct. Biliary activity passes into small bowel, consistent
with patent common bile duct.

Calculated gallbladder ejection fraction is 43%. (Normal gallbladder
ejection fraction with Ensure is greater than 33%.)
IMPRESSION: Normal hepatobiliary scan.

Normal gallbladder ejection fraction.

## 2020-08-12 ENCOUNTER — Ambulatory Visit: Payer: Medicaid Other | Admitting: Neurology

## 2020-09-08 ENCOUNTER — Encounter: Payer: Self-pay | Admitting: Certified Nurse Midwife

## 2020-09-08 ENCOUNTER — Other Ambulatory Visit: Payer: Self-pay

## 2020-09-08 ENCOUNTER — Other Ambulatory Visit (HOSPITAL_COMMUNITY)
Admission: RE | Admit: 2020-09-08 | Discharge: 2020-09-08 | Disposition: A | Discharge status: Home / Self Care | Payer: Medicaid Other | Source: Ambulatory Visit | Attending: Certified Nurse Midwife | Admitting: Certified Nurse Midwife

## 2020-09-08 ENCOUNTER — Ambulatory Visit (INDEPENDENT_AMBULATORY_CARE_PROVIDER_SITE_OTHER): Payer: Medicaid Other | Admitting: Certified Nurse Midwife

## 2020-09-08 VITALS — BP 109/70 | HR 99 | Ht 59.0 in | Wt 121.1 lb

## 2020-09-08 DIAGNOSIS — Z01419 Encounter for gynecological examination (general) (routine) without abnormal findings: Secondary | ICD-10-CM | POA: Diagnosis present

## 2020-09-08 DIAGNOSIS — Z13220 Encounter for screening for lipoid disorders: Secondary | ICD-10-CM

## 2020-09-08 DIAGNOSIS — Z124 Encounter for screening for malignant neoplasm of cervix: Secondary | ICD-10-CM

## 2020-09-08 DIAGNOSIS — Z1159 Encounter for screening for other viral diseases: Secondary | ICD-10-CM

## 2020-09-08 DIAGNOSIS — Z87898 Personal history of other specified conditions: Secondary | ICD-10-CM

## 2020-09-08 MED ORDER — NORELGESTROMIN-ETH ESTRADIOL 150-35 MCG/24HR TD PTWK: 1.0000 | MEDICATED_PATCH | TRANSDERMAL | 11 refills | Status: DC

## 2020-09-08 NOTE — Progress Notes (Signed)
GYNECOLOGY ANNUAL PREVENTATIVE CARE ENCOUNTER NOTE  History:     [name] is a 25 y.o. [phone] female here for a routine annual gynecologic exam.  Current complaints: denies .   Denies abnormal vaginal bleeding, discharge, pelvic pain, problems with intercourse or other gynecologic concerns.     Social Relationship:female partner Living: with 2 children  Work:daycare Exercise: none outside of work Smoke/Alcohol/drug use: smokes 1/2 pack day, denies drugs and alcohol use.   Gynecologic History Patient's last menstrual period was 08/25/2020 (approximate). Contraception: Ortho-Evra patches weekly Last Pap: 05/2017. Results were: normal Last mammogram: n/a    Upstream - 09/08/20 1530       Pregnancy Intention Screening   Does the patient want to become pregnant in the next year? Unsure    Does the patient's partner want to become pregnant in the next year? Unsure    Would the patient like to discuss contraceptive options today? N/A      Contraception Wrap Up   Current Method Contraceptive Patch    End Method Contraceptive Patch            The pregnancy intention screening data noted above was reviewed. Potential methods of contraception were discussed. The patient elected to proceed with Contraceptive Patch. .   Obstetric History OB History  Gravida Para Term Preterm AB Living  4 2 2   2 2   SAB IAB Ectopic Multiple Live Births  2     0 2    # Outcome Date GA Lbr Len/2nd Weight Sex Delivery Anes PTL Lv  4 Term 12/30/17 [redacted]w[redacted]d  6 lb 11.2 oz (3.04 kg) F CS-LTranv Spinal  LIV  3 Term 11/29/16 [redacted]w[redacted]d  6 lb 10.5 oz (3.02 kg) M CS-LTranv EPI  LIV  2 SAB 2015        ND  1 SAB 2012        ND    Past Medical History:  Diagnosis Date   Abdominal pain    Asthma    History of hypertension    Hypertension    Nausea    Peptic ulcer disease    PONV (postoperative nausea and vomiting)    Pre-diabetes (history)     Past Surgical History:  Procedure Laterality Date    CESAREAN SECTION N/A 11/29/2016   Procedure: CESAREAN SECTION;  Surgeon: 01/29/2017, MD;  Location: [facility] ORS;  Service: Obstetrics;  Laterality: N/A;   CESAREAN SECTION N/A 12/30/2017   Procedure: REPEAT CESAREAN SECTION;  Surgeon: 13/03/2017, MD;  Location: [facility] ORS;  Service: Obstetrics;  Laterality: N/A;   CHOLECYSTECTOMY     GALLBLADDER SURGERY     TONSILLECTOMY     WISDOM TOOTH EXTRACTION      Current Outpatient Medications on File Prior to Visit  Medication Sig Dispense Refill   ipratropium (ATROVENT) 0.06 % nasal spray Place 2 sprays into both nostrils 4 (four) times daily as needed for rhinitis. 15 mL 0   ketorolac (TORADOL) 10 MG tablet Take 1 tablet (10 mg total) by mouth every 6 (six) hours as needed for moderate pain or severe pain. 20 tablet 0   norelgestromin-ethinyl estradiol (ORTHO EVRA) 150-35 MCG/24HR transdermal patch Place 1 patch onto the skin once a week.     ondansetron (ZOFRAN ODT) 4 MG disintegrating tablet Take 1 tablet (4 mg total) by mouth every 8 (eight) hours as needed for nausea or vomiting. 20 tablet 0   [DISCONTINUED] lansoprazole (PREVACID) 30 MG capsule TAKE 1 CAPSULE (30  MG TOTAL) BY MOUTH DAILY AT 12 NOON. 90 capsule 1   No current facility-administered medications on file prior to visit.    Allergies  Allergen Reactions   Metformin And Related Shortness Of Breath   Sprintec 28 [Norgestimate-Eth Estradiol] Hives    Social History:  reports that she has quit smoking. Her smoking use included cigarettes. She smoked an average of 0.50 packs per day. She has never used smokeless tobacco. She reports previous drug use. She reports that she does not drink alcohol.  Family History  Problem Relation Age of Onset   Cholelithiasis Mother    SIDS Brother    Cholelithiasis Maternal Grandmother    Ulcers Maternal Grandmother    Diabetes Maternal Grandfather    Celiac disease Neg Hx     The following portions of the patient's history were  reviewed and updated as appropriate: allergies, current medications, past family history, past medical history, past social history, past surgical history and problem list.  Review of Systems Pertinent items noted in HPI and remainder of comprehensive ROS otherwise negative.  Physical Exam:  BP 109/70   Pulse 99   Ht 4\' 11"  (1.499 m)   Wt 121 lb 1.6 oz (54.9 kg)   LMP 08/25/2020 (Approximate)   BMI 24.46 kg/m  CONSTITUTIONAL: Well-developed, well-nourished female in no acute distress.  HENT:  Normocephalic, atraumatic, External right and left ear normal. Oropharynx is clear and moist EYES: Conjunctivae and EOM are normal. Pupils are equal, round, and reactive to light. No scleral icterus.  NECK: Normal range of motion, supple, no masses.  Normal thyroid.  SKIN: Skin is warm and dry. No rash noted. Not diaphoretic. No erythema. No pallor. MUSCULOSKELETAL: Normal range of motion. No tenderness.  No cyanosis, clubbing, or edema.  2+ distal pulses. NEUROLOGIC: Alert and oriented to person, place, and time. Normal reflexes, muscle tone coordination.  PSYCHIATRIC: Normal mood and affect. Normal behavior. Normal judgment and thought content. CARDIOVASCULAR: Normal heart rate noted, regular rhythm RESPIRATORY: Clear to auscultation bilaterally. Effort and breath sounds normal, no problems with respiration noted. BREASTS: Symmetric in size. No masses, tenderness, skin changes, nipple drainage, or lymphadenopathy bilaterally.  ABDOMEN: Soft, no distention noted.  No tenderness, rebound or guarding.  PELVIC: Normal appearing external genitalia and urethral meatus; normal appearing vaginal mucosa and cervix.  No abnormal discharge noted.  Pap smear obtained.  Normal uterine size, no other palpable masses, no uterine or adnexal tenderness.  .   Assessment and Plan:    1. Well woman exam with routine gynecological exam  Pap:Will follow up results of pap smear and manage accordingly. Mammogram : n/a   Labs: hep c, Lipid, hem a 1 c  Refills: patch  Referral: none  Routine preventative health maintenance measures emphasized. Please refer to After Visit Summary for other counseling recommendations.      08/27/2020, CNM [facility],  [facility]

## 2020-09-12 ENCOUNTER — Other Ambulatory Visit: Payer: Medicaid Other

## 2020-09-12 ENCOUNTER — Other Ambulatory Visit: Payer: Self-pay

## 2020-09-12 DIAGNOSIS — Z13220 Encounter for screening for lipoid disorders: Secondary | ICD-10-CM

## 2020-09-12 DIAGNOSIS — Z01419 Encounter for gynecological examination (general) (routine) without abnormal findings: Secondary | ICD-10-CM

## 2020-09-12 DIAGNOSIS — Z87898 Personal history of other specified conditions: Secondary | ICD-10-CM

## 2020-09-12 DIAGNOSIS — Z1159 Encounter for screening for other viral diseases: Secondary | ICD-10-CM

## 2020-09-13 LAB — LIPID PANEL
Chol/HDL Ratio: 2.8 ratio (ref 0.0–4.4)
Cholesterol, Total: 165 mg/dL (ref 100–199)
HDL: 59 mg/dL (ref >39)
LDL Chol Calc (NIH): 92 mg/dL (ref 0–99)
Triglycerides: 73 mg/dL (ref 0–149)
VLDL Cholesterol Cal: 14 mg/dL (ref 5–40)

## 2020-09-13 LAB — HEPATITIS C ANTIBODY: Hep C Virus Ab: 0.2 s/co ratio (ref 0.0–0.9)

## 2020-09-13 LAB — HEMOGLOBIN A1C
Est. average glucose Bld gHb Est-mCnc: 97 mg/dL
Hgb A1c MFr Bld: 5 % (ref 4.8–5.6)

## 2020-09-15 ENCOUNTER — Other Ambulatory Visit: Payer: Self-pay | Admitting: Certified Nurse Midwife

## 2020-09-15 LAB — CYTOLOGY - PAP: Diagnosis: NEGATIVE

## 2020-09-15 MED ORDER — METRONIDAZOLE 500 MG PO TABS: 500.0000 mg | ORAL_TABLET | Freq: Two times a day (BID) | ORAL | 0 refills | Status: AC

## 2020-09-15 NOTE — Progress Notes (Signed)
Pap smear show bacterial vaginosis. Orders placed for treatment. Pt notified via my chart.   Doreene Burke, CNM

## 2020-09-18 ENCOUNTER — Other Ambulatory Visit: Payer: Self-pay

## 2020-09-18 ENCOUNTER — Ambulatory Visit (INDEPENDENT_AMBULATORY_CARE_PROVIDER_SITE_OTHER): Payer: Medicaid Other

## 2020-09-18 DIAGNOSIS — Z23 Encounter for immunization: Secondary | ICD-10-CM | POA: Diagnosis not present

## 2020-09-18 NOTE — Patient Instructions (Addendum)
HPV (Human Papillomavirus) Vaccine: What You Need to Know 1. Why get vaccinated? HPV (human papillomavirus) vaccine can prevent infection with some types of human papillomavirus. HPV infections can cause certain types of cancers, including: cervical, vaginal, and vulvar cancers in women penile cancer in men anal cancers in both men and women cancers of tonsils, base of tongue, and back of throat (oropharyngeal cancer) in both men and women HPV infections can also cause anogenital warts. HPV vaccine can prevent over 90% of cancers caused by HPV. HPV is spread through intimate skin-to-skin or sexual contact. HPV infections are so common that nearly all people will get at least one type of HPV at some time in their lives. Most HPV infections go away on their own within 2 years. But sometimes HPV infections will last longer and can cause cancers later inlife. 2. HPV vaccine HPV vaccine is routinely recommended for adolescents at 11 or 25 years of age to ensure they are protected before they are exposed to the virus. HPV vaccine may be given beginning at age 9 years and vaccination is recommended foreveryone through 26 years of age. HPV vaccine may be given to adults 27 through 25 years of age, based ondiscussions between the patient and health care provider. Most children who get the first dose before 15 years of age need 2 doses of HPV vaccine. People who get the first dose at or after 15 years of age and younger people with certain immunocompromising conditions need 3 doses. Your healthcare provider can give you more information. HPV vaccine may be given at the same time as other vaccines. 3. Talk with your health care provider Tell your vaccination provider if the person getting the vaccine: Has had an allergic reaction after a previous dose of HPV vaccine, or has any severe, life-threatening allergies Is pregnant--HPV vaccine is not recommended until after pregnancy In some cases, your health  care provider may decide to postpone HPV vaccinationuntil a future visit. People with minor illnesses, such as a cold, may be vaccinated. People who are moderately or severely ill should usually wait until they recover beforegetting HPV vaccine. Your health care provider can give you more information. 4. Risks of a vaccine reaction Soreness, redness, or swelling where the shot is given can happen after HPV vaccination. Fever or headache can happen after HPV vaccination. People sometimes faint after medical procedures, including vaccination. Tellyour provider if you feel dizzy or have vision changes or ringing in the ears. As with any medicine, there is a very remote chance of a vaccine causing asevere allergic reaction, other serious injury, or death. 5. What if there is a serious problem? An allergic reaction could occur after the vaccinated person leaves the clinic. If you see signs of a severe allergic reaction (hives, swelling of the face and throat, difficulty breathing, a fast heartbeat, dizziness, or weakness), call 9-1-1 and get the person to the nearest hospital. For other signs that concern you, call your health care provider. Adverse reactions should be reported to the Vaccine Adverse Event Reporting System (VAERS). Your health care provider will usually file this report, or you can do it yourself. Visit the VAERS website at www.vaers.hhs.gov or call 1-800-822-7967. VAERS is only for reporting reactions, and VAERS staff members do not give medical advice. 6. The National Vaccine Injury Compensation Program The National Vaccine Injury Compensation Program (VICP) is a federal program that was created to compensate people who may have been injured by certain vaccines. Claims regarding alleged injury or   death due to vaccination have a time limit for filing, which may be as short as two years. Visit the VICP website at SpiritualWord.at or call 440-096-6489 to learn about the  program and about filing a claim. 7. How can I learn more? Ask your health care provider. Call your local or state health department. Visit the website of the Food and Drug Administration (FDA) for vaccine package inserts and additional information at FinderList.no. Contact the Centers for Disease Control and Prevention (CDC): Call 340-647-6522 (1-800-CDC-INFO) or Visit CDC's website at PicCapture.uy. Vaccine Information Statement HPV Vaccine (10/05/2019) This information is not intended to replace advice given to you by your health care provider. Make sure you discuss any questions you have with your healthcare provider. Document Revised: 11/13/2019 Document Reviewed: 11/13/2019 Elsevier Patient Education  2022 Elsevier Inc.      HPV Vaccine Information for Parents  HPV (human papillomavirus) is a common virus that spreads easily from person to person through skin-to-skin or sexual contact. There are many types of HPV viruses. They can cause warts in the genitals (genital or mucosal HPV), or on the hands or feet (cutaneous or nonmucosal HPV). Some genital HPV types are considered high-risk and may cause cancer. Your child can get a vaccination to help prevent certain HPV infections that can cause cancer as well as those types that cause genital and anal warts. The vaccine is safe and effective. It is recommended for boys and girls at about 33-108 years of age. Getting the vaccine at this age (before he or she is sexually active) gives your child the best protection from HPV infectionthrough adulthood. How can HPV affect my child? HPV infection can cause: Genital warts. Mouth or throat cancer. Anal cancer. Cervical, vulvar, or vaginal cancer. Penile cancer. During pregnancy, HPV infection can be passed to the baby. This infection cancause warts to develop in a baby's throat and windpipe. What actions can I take to lower my child's risk for HPV? To  lower your child's risk for genital HPV infection, have him or her get the HPV vaccine before becoming sexually active. The best time for vaccination is between ages 49 and 2, though it can be given to children as young as 23 years old. If your child gets the first dose before age 17, the vaccination can be given as 2 shots, 6-12 months apart. In some situations, 3 doses are needed. If your child starts the vaccine before age 61 but does not have a second dose within 6-12 months after the first dose, he or she will need 3 doses to complete the vaccination. When your child has the first dose, it is important to make an appointment for the next shot and keep the appointment. Teens who are not vaccinated before age 8 will need 3 doses, within six months of the first dose. If your child has a weak immune system, he or she may need 3 doses. Young adults can also get the vaccination, even if they are already sexually active and even if they have already been infected with HPV. The vaccination can still help prevent the types of cancer-causing HPV that a person has notbeen infected with. What are the risks and benefits of the HPV vaccine? Benefits The main benefit of getting vaccinated is to prevent certain cancers, including: Cervical, vulvar, and vaginal cancer in females. Penile cancer in males. Oral and anal cancer in both males and females. The risk of these cancers is lower if your child gets vaccinated before he  orshe becomes sexually active. The vaccine also prevents genital warts caused by HPV. Risks The risks, although low, include side effects or reactions to the vaccine. Very few reactions have been reported, but they can include: Soreness, redness, or swelling at the injection site. Dizziness or headache. Fever. Who should not get the HPV vaccine or should wait to get it? Some children should not get the HPV vaccine or should wait. Discuss the risks and benefits of the vaccine with your  child's health care provider if your child: Has had a severe allergic reaction to other vaccinations. Is allergic to yeast. Has a fever. Has had a recent illness. Is pregnant or may be pregnant. Where to find more information Centers for Disease Control and Prevention: https://www.mckee-gibson.com/ American Academy of Pediatrics: healthychildren.org Summary HPV (human papillomavirus) is a common virus that spreads from person to person through skin-to-skin or sexual contact. It can spread during vaginal, anal, or oral sex. Your child can get a vaccination to prevent HPV infection and cancer. It is best to get the vaccination before becoming sexually active. The HPV vaccine can protect your child from genital warts and certain types of cancer, including cancer of the cervix, throat, mouth, vulva, vagina, anus, and penis. The HPV vaccine is both safe and effective. The best time for boys and girls to get the vaccination is when they are between ages 72 and 49. This information is not intended to replace advice given to you by your health care provider. Make sure you discuss any questions you have with your healthcare provider. Document Revised: 10/23/2019 Document Reviewed: 10/02/2019 Elsevier Patient Education  2022 [employer].

## 2020-09-18 NOTE — Progress Notes (Signed)
Pt was given HPV vaccine today. Pt stated that she was doing well. After obtaining informed consent, the immunization is given by Shawn Route, LPN.

## 2020-11-17 ENCOUNTER — Telehealth: Payer: Self-pay | Admitting: Certified Nurse Midwife

## 2020-11-17 NOTE — Telephone Encounter (Signed)
Patient called in and states she took a pregnancy test and it was positive.  Then two days later she took another test and it was negative.  Patient started spotting on Wednesday and her spotting ended yesterday morning.  Patient is due to apply her birth control patch and didn't want to use it if she was pregnant.  I asked patient if she tested for pregnancy after her negative test and she stated she had not.  Patient would like to know what to do.  Please advise.

## 2020-11-19 ENCOUNTER — Telehealth: Payer: Self-pay | Admitting: Certified Nurse Midwife

## 2020-11-19 ENCOUNTER — Other Ambulatory Visit: Payer: Self-pay

## 2020-11-19 DIAGNOSIS — Z3200 Encounter for pregnancy test, result unknown: Secondary | ICD-10-CM

## 2020-11-19 NOTE — Telephone Encounter (Signed)
LVM informing patient order has been placed for bloodwork for her next visit.

## 2020-11-19 NOTE — Telephone Encounter (Signed)
Patient called following up- she states that she has taken 2 pregnancy test- one positive one negative, pt has been having spotting- states not like her normal cycle. Pt was suppose to replace her BC patch on Monday and did not since she was unsure if she is pregnant. Pt is scheduled to coming in tomorrow for 2nd HPV vaccine. Pt is wanting to know if she could have blood test or pregnant confirmation. Please Advise.

## 2020-11-20 ENCOUNTER — Other Ambulatory Visit: Payer: Self-pay

## 2020-11-20 ENCOUNTER — Ambulatory Visit (INDEPENDENT_AMBULATORY_CARE_PROVIDER_SITE_OTHER): Payer: Medicaid Other

## 2020-11-20 DIAGNOSIS — Z3200 Encounter for pregnancy test, result unknown: Secondary | ICD-10-CM

## 2020-11-20 NOTE — Progress Notes (Signed)
Pt present for second dose of HPV. Pt stated that she took a pregnancy test and it was possible then she took another one and it was negative. Pt is unsure if she is pregnant or not. Pt had beta labs drawn today. HPV vaccine not administered due to high risk during pregnancy.

## 2020-11-21 LAB — BETA HCG QUANT (REF LAB): hCG Quant: <1 m[IU]/mL

## 2021-01-10 IMAGING — US ULTRASOUND ABDOMEN LIMITED
1 series · 14 of 25 positions shown · non-contrast
Comparison: 05/12/2014

CLINICAL DATA: Right upper quadrant pain

EXAM:
ULTRASOUND ABDOMEN LIMITED RIGHT UPPER QUADRANT

[Series 1: ultrasound abdomen limited · 0.17mm/px · 14 of 83 slices shown]
[im 1/83]
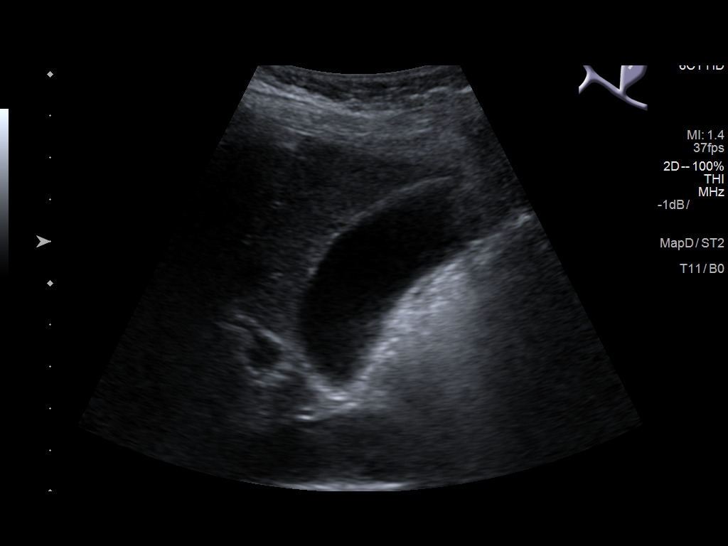
[im 7/83]
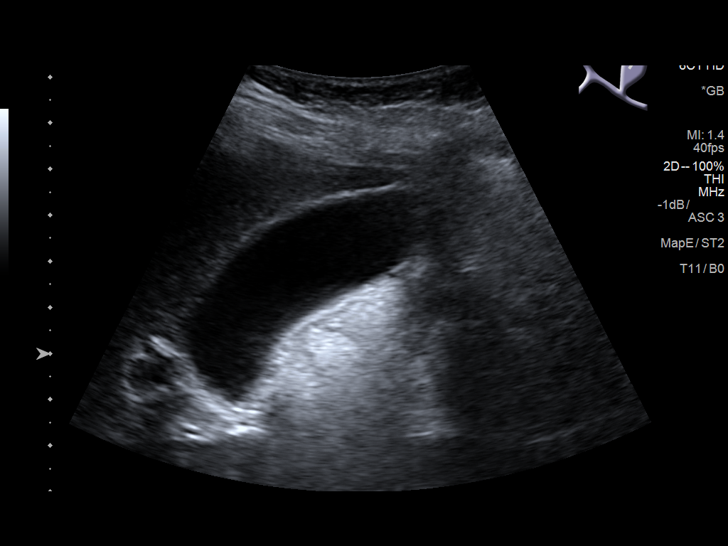
[im 14/83]
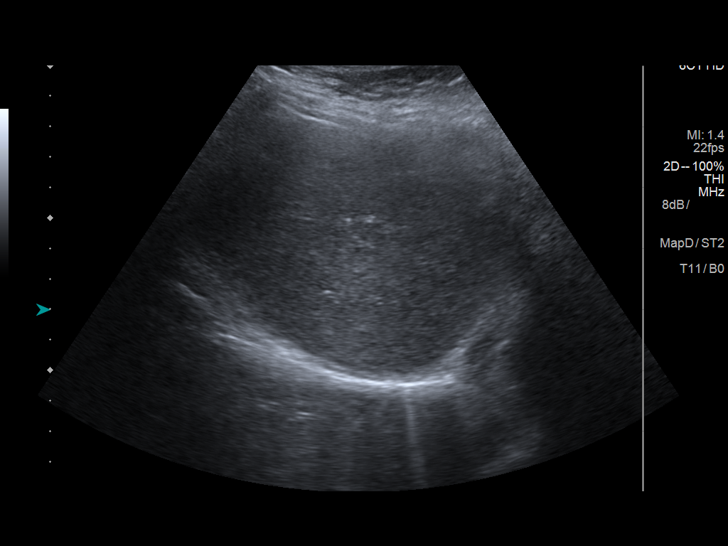
[im 21/83]
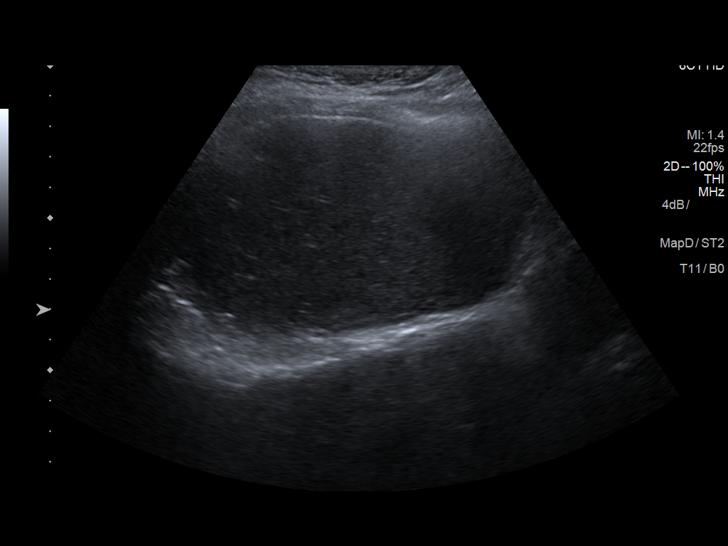
[im 28/83]
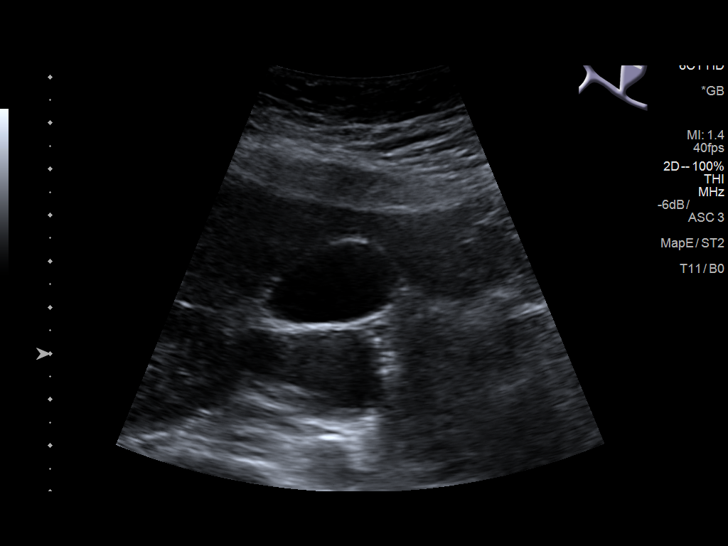
[im 31/83]
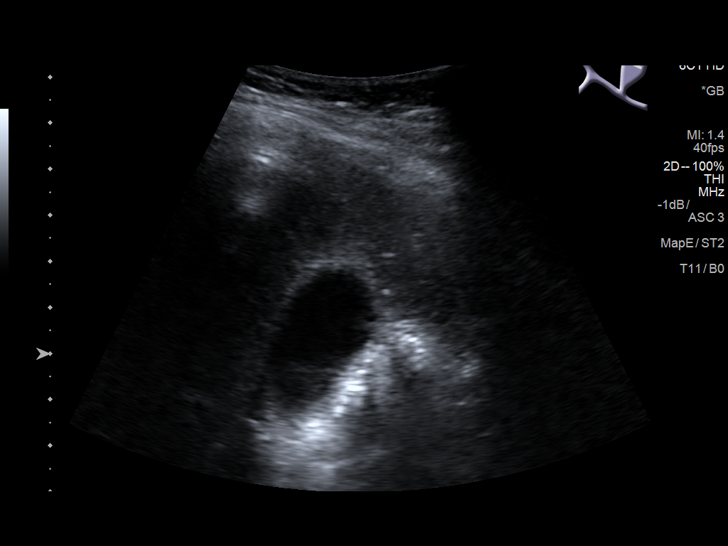
[im 38/83]
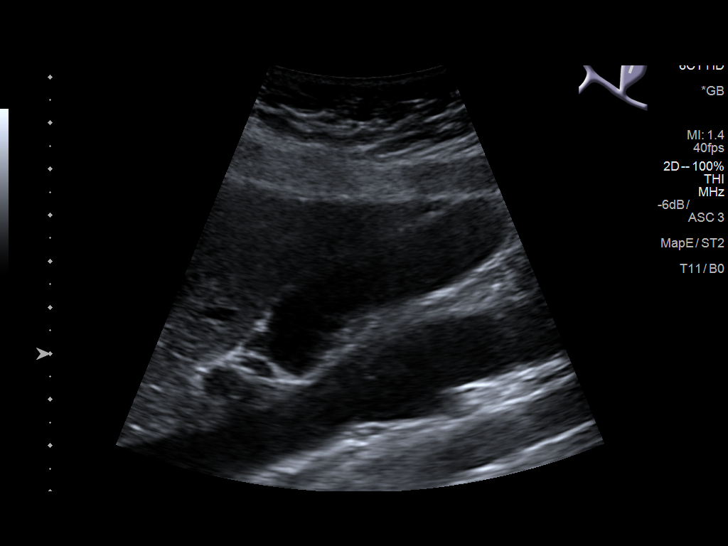
[im 45/83]
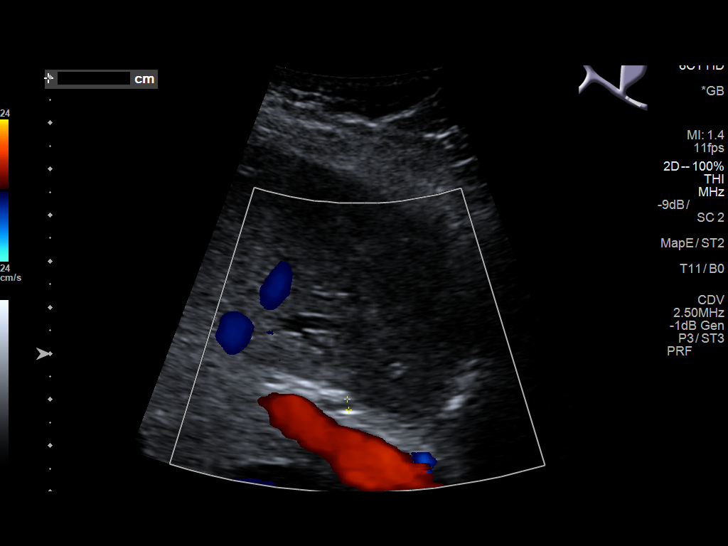
[im 52/83]
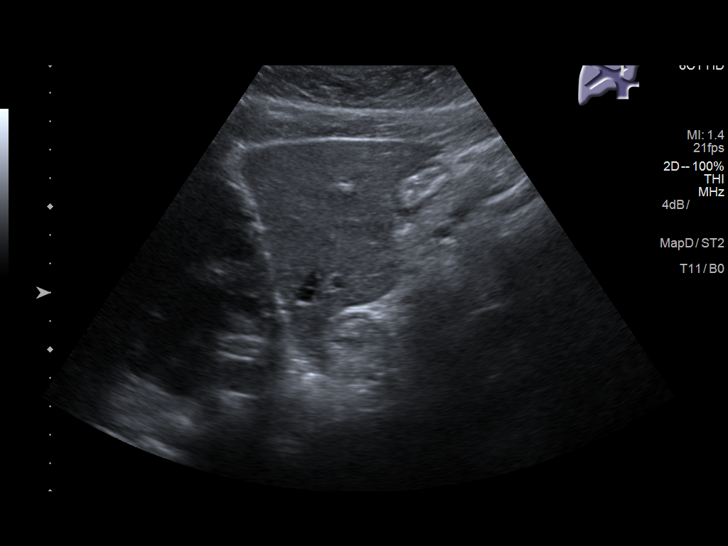
[im 55/83]
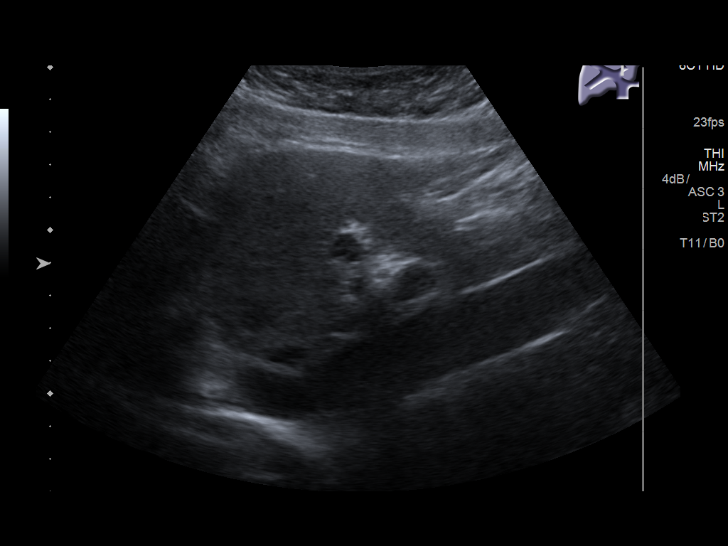
[im 62/83]
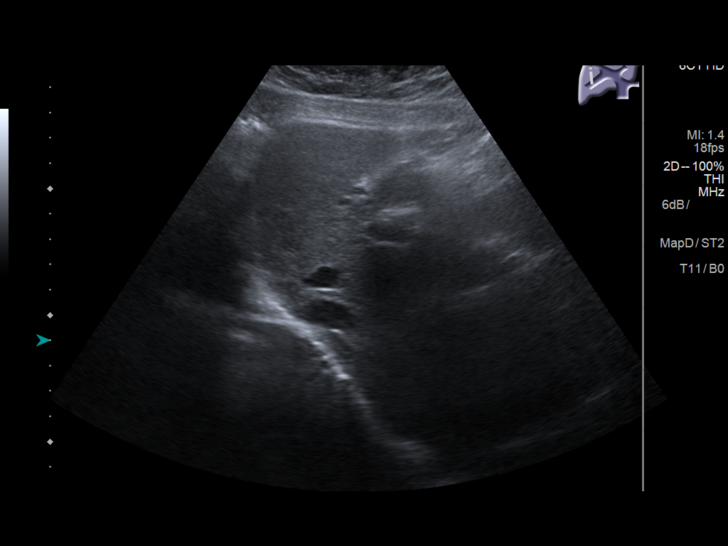
[im 69/83]
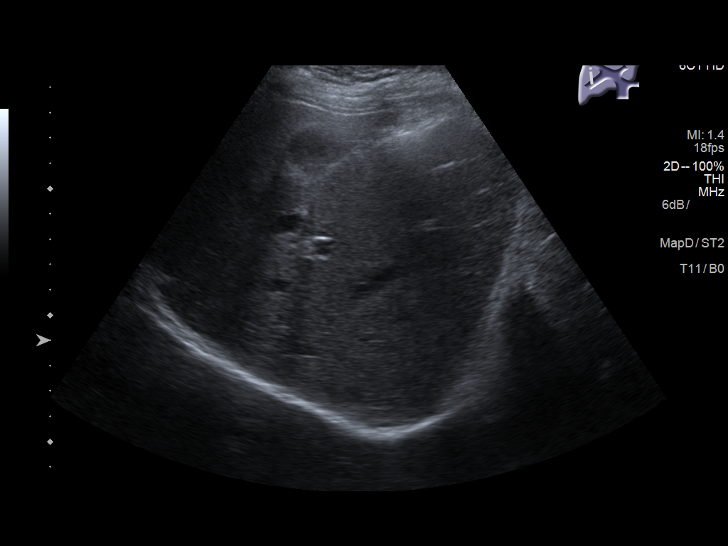
[im 76/83]
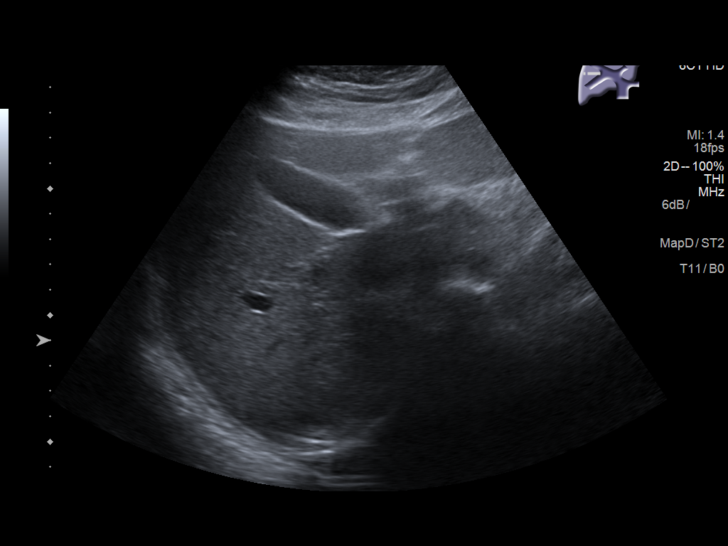
[im 83/83]
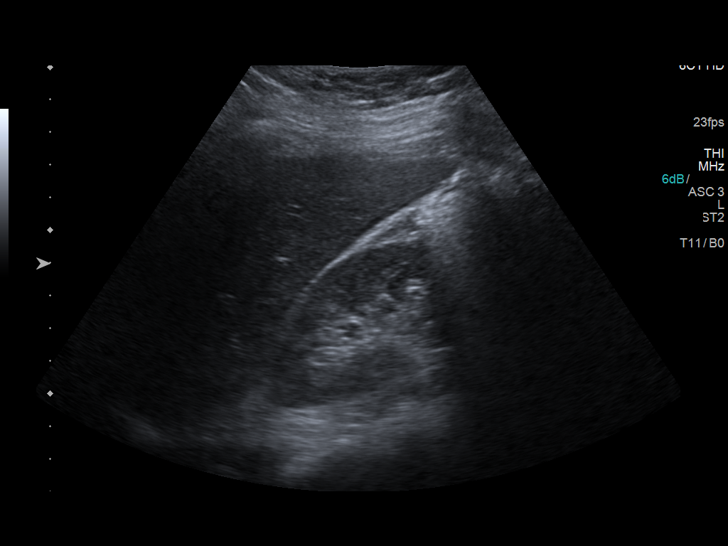

[14 of 25 positions shown; findings below may reference images not displayed]

FINDINGS: Gallbladder:

No gallstones or wall thickening visualized. No sonographic Murphy
sign noted by sonographer.

Common bile duct:

Diameter: Normal caliber, 1 mm

Liver:

No focal lesion identified. Within normal limits in parenchymal
echogenicity. Portal vein is patent on color Doppler imaging with
normal direction of blood flow towards the liver.
IMPRESSION: Normal right upper quadrant ultrasound.

## 2021-03-01 NOTE — L&D Delivery Note (Signed)
Delivery Summary for [facility]  Labor Events:   Preterm labor: No data found  Rupture date: No data found  Rupture time: No data found  Rupture type: No data found  Fluid Color: No data found  Induction: No data found  Augmentation: No data found  Complications: No data found  Cervical ripening: No data found No data found   No data found     Delivery:   Episiotomy: No data found  Lacerations: No data found  Repair suture: No data found  Repair # of packets: No data found  Blood loss (ml): 345   Information for the patient's newborn:  Kaho, Selle Girl Vianney [[id number]]   Delivery 02/24/2022 12:55 PM by  C-Section, Low Transverse Sex:  female Gestational Age: [redacted]w[redacted]d Delivery Clinician:   Living?:         APGARS  One minute Five minutes Ten minutes  Skin color:        Heart rate:        Grimace:        Muscle tone:        Breathing:        Totals: 8  9      Presentation/position:      Resuscitation:   Cord information:    Disposition of cord blood:     Blood gases sent?  Complications:   Placenta: Delivered:       appearance Newborn Measurements: Weight: 4 lb 7.6 oz (2030 g)  Height: 17.72"  Head circumference:    Chest circumference:    Other providers:    Additional  information: Forceps:   Vacuum:   Breech:   Observed anomalies       See Dr. Oretha Milch operative note for details of delivery procedure.    Hildred Laser, MD  OB/GYN at [facility]

## 2021-03-02 ENCOUNTER — Encounter: Payer: Self-pay | Admitting: Certified Nurse Midwife

## 2021-07-21 ENCOUNTER — Encounter: Payer: Self-pay | Admitting: Certified Nurse Midwife

## 2021-07-21 ENCOUNTER — Other Ambulatory Visit: Payer: Self-pay

## 2021-07-21 ENCOUNTER — Other Ambulatory Visit: Payer: Self-pay | Admitting: Certified Nurse Midwife

## 2021-07-21 MED ORDER — DOXYLAMINE-PYRIDOXINE 10-10 MG PO TBEC: 1.0000 | DELAYED_RELEASE_TABLET | Freq: Four times a day (QID) | ORAL | 5 refills | Status: DC

## 2021-07-22 ENCOUNTER — Other Ambulatory Visit: Payer: Self-pay

## 2021-07-22 ENCOUNTER — Encounter (HOSPITAL_COMMUNITY): Payer: Self-pay | Admitting: Obstetrics and Gynecology

## 2021-07-22 ENCOUNTER — Inpatient Hospital Stay (HOSPITAL_COMMUNITY)
Admission: AD | Admit: 2021-07-22 | Discharge: 2021-07-22 | Disposition: A | Discharge status: Home / Self Care | Payer: Medicaid Other | Attending: Obstetrics and Gynecology | Admitting: Obstetrics and Gynecology

## 2021-07-22 DIAGNOSIS — O219 Vomiting of pregnancy, unspecified: Secondary | ICD-10-CM | POA: Diagnosis present

## 2021-07-22 DIAGNOSIS — Z3A01 Less than 8 weeks gestation of pregnancy: Secondary | ICD-10-CM | POA: Diagnosis not present

## 2021-07-22 LAB — URINALYSIS, ROUTINE W REFLEX MICROSCOPIC
Bilirubin Urine: NEGATIVE
Glucose, UA: NEGATIVE mg/dL
Ketones, ur: 80 mg/dL — AB
Nitrite: NEGATIVE
Protein, ur: 30 mg/dL — AB
Specific Gravity, Urine: 1.031 — ABNORMAL HIGH (ref 1.005–1.030)
pH: 5 (ref 5.0–8.0)

## 2021-07-22 LAB — POCT PREGNANCY, URINE: Preg Test, Ur: POSITIVE — AB

## 2021-07-22 MED ORDER — LACTATED RINGERS IV BOLUS
1000.0000 mL | Freq: Once | INTRAVENOUS | Status: AC
  Administered 2021-07-22: 1000 mL via INTRAVENOUS

## 2021-07-22 MED ORDER — METOCLOPRAMIDE HCL 5 MG/ML IJ SOLN
10.0000 mg | Freq: Once | INTRAMUSCULAR | Status: AC
  Administered 2021-07-22: 10 mg via INTRAVENOUS
  Filled 2021-07-22: qty 2

## 2021-07-22 MED ORDER — METOCLOPRAMIDE HCL 10 MG PO TABS: 10.0000 mg | ORAL_TABLET | Freq: Three times a day (TID) | ORAL | 0 refills | Status: DC | PRN

## 2021-07-22 MED ORDER — FAMOTIDINE IN NACL 20-0.9 MG/50ML-% IV SOLN
20.0000 mg | Freq: Once | INTRAVENOUS | Status: AC
  Administered 2021-07-22: 20 mg via INTRAVENOUS
  Filled 2021-07-22: qty 50

## 2021-07-22 NOTE — MAU Note (Signed)
[name] is a 26 y.o. at Unknown here in MAU reporting: has not been able to keep anything down since Sunday, not drink or food.  Now has no energy, feeling weak, thinks she is dehydrated. +HPT 3 wks ago.  Has not been confirmed. No pain or bleeding.  LMP: 4/7 Onset of complaint: Sunday Pain score: none Vitals:   07/22/21 1852  BP: 113/76  Pulse: 97  Resp: 16  Temp: 97.7 F (36.5 C)  SpO2: 98%      Lab orders placed from triage:  UPT< UA if +

## 2021-07-22 NOTE — MAU Provider Note (Signed)
History     [id number]  Arrival date and time: 07/22/21 1722    Chief Complaint  Patient presents with   Emesis     HPI [name] is a 26 y.o. at [redacted]w[redacted]d who presents for vomiting. Reports daily nausea & vomiting since Sunday. Was prescribed diclegis by her OB but pharmacy doesn't have it ready for her yet. Reports vomiting twice this morning & continues to be nauseated. Has only been able to keep down ice chips today.  Denies abdominal pain, fever, diarrhea, or vaginal bleeding.   OB History     Gravida  5   Para  2   Term  2   Preterm      AB  2   Living  2      SAB  2   IAB      Ectopic      Multiple  0   Live Births  2           Past Medical History:  Diagnosis Date   Abdominal pain    Asthma    Hypertension    Nausea    Peptic ulcer disease    PONV (postoperative nausea and vomiting)    Pre-diabetes (history)     Past Surgical History:  Procedure Laterality Date   CESAREAN SECTION N/A 11/29/2016   Procedure: CESAREAN SECTION;  Surgeon: Herold Harms, MD;  Location: [facility] ORS;  Service: Obstetrics;  Laterality: N/A;   CESAREAN SECTION N/A 12/30/2017   Procedure: REPEAT CESAREAN SECTION;  Surgeon: Hildred Laser, MD;  Location: [facility] ORS;  Service: Obstetrics;  Laterality: N/A;   CHOLECYSTECTOMY     TONSILLECTOMY     WISDOM TOOTH EXTRACTION      Family History  Problem Relation Age of Onset   Cholelithiasis Mother    SIDS Brother    Cholelithiasis Maternal Grandmother    Ulcers Maternal Grandmother    Diabetes Maternal Grandfather    Celiac disease Neg Hx     Allergies  Allergen Reactions   Metformin And Related Shortness Of Breath   Sprintec 28 [Norgestimate-Eth Estradiol] Hives    No current facility-administered medications on file prior to encounter.   Current Outpatient Medications on File Prior to Encounter  Medication Sig Dispense Refill   Doxylamine-Pyridoxine 10-10 MG TBEC Take 1 tablet by mouth 4 (four)  times daily. Day 1 &2: 2 tablet at bedtimeDay 3 : if symptoms persists 1 tablet am; 2 tablet at bedtimeDay 4: 1 tablet am, 1 tab afternoon, 2 tab at bedtime 120 tablet 5   [DISCONTINUED] lansoprazole (PREVACID) 30 MG capsule TAKE 1 CAPSULE (30 MG TOTAL) BY MOUTH DAILY AT 12 NOON. 90 capsule 1     ROS Pertinent positives and negative per HPI, all others reviewed and negative  Physical Exam   BP 113/76 (BP Location: Right Arm)   Pulse 97   Temp 97.7 F (36.5 C) (Oral)   Resp 16   Ht 4\' 11"  (1.499 m)   Wt 47 kg   LMP 06/05/2021   SpO2 98%   BMI 20.94 kg/m   Patient Vitals for the past 24 hrs:  BP Temp Temp src Pulse Resp SpO2 Height Weight  07/22/21 1852 113/76 97.7 F (36.5 C) Oral 97 16 98 % 4\' 11"  (1.499 m) 47 kg    Physical Exam Vitals and nursing note reviewed.  Constitutional:      General: She is not in acute distress.    Appearance: Normal appearance. She is not  ill-appearing.  HENT:     Head: Normocephalic and atraumatic.  Eyes:     General: No scleral icterus.    Conjunctiva/sclera: Conjunctivae normal.     Pupils: Pupils are equal, round, and reactive to light.  Pulmonary:     Effort: Pulmonary effort is normal. No respiratory distress.  Neurological:     Mental Status: She is alert.  Psychiatric:        Mood and Affect: Mood normal.        Behavior: Behavior normal.     Labs Results for orders placed or performed during the hospital encounter of 07/22/21 (from the past 24 hour(s))  Pregnancy, urine POC     Status: Abnormal   Collection Time: 07/22/21  7:17 PM  Result Value Ref Range   Preg Test, Ur POSITIVE (A) NEGATIVE  Urinalysis, Routine w reflex microscopic Urine, Clean Catch     Status: Abnormal   Collection Time: 07/22/21  7:18 PM  Result Value Ref Range   Color, Urine AMBER (A) YELLOW   APPearance CLOUDY (A) CLEAR   Specific Gravity, Urine 1.031 (H) 1.005 - 1.030   pH 5.0 5.0 - 8.0   Glucose, UA NEGATIVE NEGATIVE mg/dL   Hgb urine dipstick  MODERATE (A) NEGATIVE   Bilirubin Urine NEGATIVE NEGATIVE   Ketones, ur 80 (A) NEGATIVE mg/dL   Protein, ur 30 (A) NEGATIVE mg/dL   Nitrite NEGATIVE NEGATIVE   Leukocytes,Ua TRACE (A) NEGATIVE   RBC / HPF 0-5 0 - 5 RBC/hpf   WBC, UA 11-20 0 - 5 WBC/hpf   Bacteria, UA RARE (A) NONE SEEN   Squamous Epithelial / LPF 21-50 0 - 5   Mucus PRESENT    Amorphous Crystal PRESENT     Imaging No results found.  MAU Course  Procedures Lab Orders         Urinalysis, Routine w reflex microscopic Urine, Clean Catch         Pregnancy, urine POC     Meds ordered this encounter  Medications   metoCLOPramide (REGLAN) injection 10 mg   famotidine (PEPCID) IVPB 20 mg premix   lactated ringers bolus 1,000 mL   metoCLOPramide (REGLAN) 10 MG tablet    Sig: Take 1 tablet (10 mg total) by mouth every 8 (eight) hours as needed for nausea.    Dispense:  30 tablet    Refill:  0    Order Specific Question:   Supervising Provider    Answer:   Nettie Elm L [[id number]]   Imaging Orders  No imaging studies ordered today    MDM No abdominal pain or vaginal bleeding. Has f/u with ob scheduled.   IV fluids, reglan, & pepcid given. Patient without vomiting in MAU & reports improvement of nausea.  Assessment and Plan   1. Nausea and vomiting during pregnancy prior to [redacted] weeks gestation   2. [redacted] weeks gestation of pregnancy    -Rx reglan -reviewed reasons to return to MAU -keep f/u with [facility] as scheduled   Judeth Horn, NP 07/22/21 9:57 PM

## 2021-07-23 ENCOUNTER — Encounter: Payer: Self-pay | Admitting: Certified Nurse Midwife

## 2021-07-29 ENCOUNTER — Encounter: Payer: Self-pay | Admitting: Certified Nurse Midwife

## 2021-07-29 ENCOUNTER — Ambulatory Visit (INDEPENDENT_AMBULATORY_CARE_PROVIDER_SITE_OTHER): Payer: Medicaid Other | Admitting: Certified Nurse Midwife

## 2021-07-29 VITALS — BP 124/85 | HR 82 | Ht 59.0 in | Wt 110.3 lb

## 2021-07-29 DIAGNOSIS — Z3200 Encounter for pregnancy test, result unknown: Secondary | ICD-10-CM

## 2021-07-29 LAB — POCT URINE PREGNANCY: Preg Test, Ur: POSITIVE — AB

## 2021-07-29 MED ORDER — CVS PRENATAL GUMMY 0.4 MG PO CHEW: 1.0000 | CHEWABLE_TABLET | Freq: Every day | ORAL | 3 refills | Status: DC

## 2021-07-29 NOTE — Progress Notes (Signed)
Subjective:    [name] is a 26 y.o. female who presents for evaluation of amenorrhea. She believes she could be pregnant. Pregnancy is desired. Sexual Activity: single partner, contraception: none. Current symptoms also include: nausea. Last period was normal.   Patient's last menstrual period was 06/05/2021. The following portions of the patient's history were reviewed and updated as appropriate: allergies, current medications, past family history, past medical history, past social history, past surgical history, and problem list.  Review of Systems Pertinent items are noted in HPI.     Objective:    BP 124/85   Pulse 82   Ht 4\' 11"  (1.499 m)   Wt 110 lb 4.8 oz (50 kg)   LMP 06/05/2021   BMI 22.28 kg/m  General: alert, cooperative, appears stated age, no distress, and no acute distress    Lab Review Urine HCG: positive    Assessment:    Absence of menstruation.     Plan:    Pregnancy Test:  Positive: EDC: 03/12/2022. Briefly discussed pre-natal care options.. Encouraged well-balanced diet, plenty of rest when needed, pre-natal vitamins daily and walking for exercise. Discussed self-help for nausea, avoiding OTC medications until consulting provider or pharmacist, other than Tylenol as needed, minimal caffeine (1-2 cups daily) and avoiding alcohol. She will schedule her u/s 1-2 wks , nurse visit in 3 wk sand initial OB visit in 5 wks. Feel free to call with any questions.    05/11/2022, CNM

## 2021-07-29 NOTE — Patient Instructions (Signed)
Prenatal Care Prenatal care is health care during pregnancy. It helps you and your unborn baby (fetus) stay as healthy as possible. Prenatal care may be provided by a midwife, a family practice doctor, a mid-level practitioner (nurse practitioner or physician assistant), or a childbirth and pregnancy doctor (obstetrician). How does this affect me? During pregnancy, you will be closely monitored for any new conditions that might develop. To lower your risk of pregnancy complications, you and your health care provider will talk about any underlying conditions you have. How does this affect my baby? Early and consistent prenatal care increases the chance that your baby will be healthy during pregnancy. Prenatal care lowers the risk that your baby will be: Born early (prematurely). Smaller than expected at birth (small for gestational age). What can I expect at the first prenatal care visit? Your first prenatal care visit will likely be the longest. You should schedule your first prenatal care visit as soon as you know that you are pregnant. Your first visit is a good time to talk about any questions or concerns you have about pregnancy. Medical history At your visit, you and your health care provider will talk about your medical history, including: Any past pregnancies. Your family's medical history. Medical history of the baby's father. Any long-term (chronic) health conditions you have and how you manage them. Any surgeries or procedures you have had. Any current over-the-counter or prescription medicines, herbs, or supplements that you are taking. Other factors that could pose a risk to your baby, including: Exposure to harmful chemicals or radiation at work or at home. Any substance use, including tobacco, alcohol, and drug use. Your home setting and your stress levels, including: Exposure to abuse or violence. Household financial strain. Your daily health habits, including diet and  exercise. Tests and screenings Your health care provider will: Measure your weight, height, and blood pressure. Do a physical exam, including a pelvic and breast exam. Perform blood tests and urine tests to check for: Urinary tract infection. Sexually transmitted infections (STIs). Low iron levels in your blood (anemia). Blood type and certain proteins on red blood cells (Rh antibodies). Infections and immunity to viruses, such as hepatitis B and rubella. HIV (human immunodeficiency virus). Discuss your options for genetic screening. Tips about staying healthy Your health care provider will also give you information about how to keep yourself and your baby healthy, including: Nutrition and taking vitamins. Physical activity. How to manage pregnancy symptoms such as nausea and vomiting (morning sickness). Infections and substances that may be harmful to your baby and how to avoid them. Food safety. Dental care. Working. Travel. Warning signs to watch for and when to call your health care provider. How often will I have prenatal care visits? After your first prenatal care visit, you will have regular visits throughout your pregnancy. The visit schedule is often as follows: Up to week 28 of pregnancy: once every 4 weeks. 28-36 weeks: once every 2 weeks. After 36 weeks: every week until delivery. Some women may have visits more or less often depending on any underlying health conditions and the health of the baby. Keep all follow-up and prenatal care visits. This is important. What happens during routine prenatal care visits? Your health care provider will: Measure your weight and blood pressure. Check for fetal heart sounds. Measure the height of your uterus in your abdomen (fundal height). This may be measured starting around week 20 of pregnancy. Check the position of your baby inside your uterus. Ask questions   about your diet, sleeping patterns, and whether you can feel the baby  move. Review warning signs to watch for and signs of labor. Ask about any pregnancy symptoms you are having and how you are dealing with them. Symptoms may include: Headaches. Nausea and vomiting. Vaginal discharge. Swelling. Fatigue. Constipation. Changes in your vision. Feeling persistently sad or anxious. Any discomfort, including back or pelvic pain. Bleeding or spotting. Make a list of questions to ask your health care provider at your routine visits. What tests might I have during prenatal care visits? You may have blood, urine, and imaging tests throughout your pregnancy, such as: Urine tests to check for glucose, protein, or signs of infection. Glucose tests to check for a form of diabetes that can develop during pregnancy (gestational diabetes mellitus). This is usually done around week 24 of pregnancy. Ultrasounds to check your baby's growth and development, to check for birth defects, and to check your baby's well-being. These can also help to decide when you should deliver your baby. A test to check for group B strep (GBS) infection. This is usually done around week 36 of pregnancy. Genetic testing. This may include blood, fluid, or tissue sampling, or imaging tests, such as an ultrasound. Some genetic tests are done during the first trimester and some are done during the second trimester. What else can I expect during prenatal care visits? Your health care provider may recommend getting certain vaccines during pregnancy. These may include: A yearly flu shot (annual influenza vaccine). This is especially important if you will be pregnant during flu season. Tdap (tetanus, diphtheria, pertussis) vaccine. Getting this vaccine during pregnancy can protect your baby from whooping cough (pertussis) after birth. This vaccine may be recommended between weeks 27 and 36 of pregnancy. A COVID-19 vaccine. Later in your pregnancy, your health care provider may give you information  about: Childbirth and breastfeeding classes. Choosing a health care provider for your baby. Umbilical cord banking. Breastfeeding. Birth control after your baby is born. The hospital labor and delivery unit and how to set up a tour. Registering at the hospital before you go into labor. Where to find more information Office on Women's Health: womenshealth.gov American Pregnancy Association: americanpregnancy.org March of Dimes: marchofdimes.org Summary Prenatal care helps you and your baby stay as healthy as possible during pregnancy. Your first prenatal care visit will most likely be the longest. You will have visits and tests throughout your pregnancy to monitor your health and your baby's health. Bring a list of questions to your visits to ask your health care provider. Make sure to keep all follow-up and prenatal care visits. This information is not intended to replace advice given to you by your health care provider. Make sure you discuss any questions you have with your health care provider. Document Revised: 11/29/2019 Document Reviewed: 11/29/2019 Elsevier Patient Education  2023 Elsevier Inc.  

## 2021-08-07 ENCOUNTER — Other Ambulatory Visit: Payer: Self-pay | Admitting: Certified Nurse Midwife

## 2021-08-07 ENCOUNTER — Ambulatory Visit (INDEPENDENT_AMBULATORY_CARE_PROVIDER_SITE_OTHER): Payer: Medicaid Other

## 2021-08-07 DIAGNOSIS — Z3A08 8 weeks gestation of pregnancy: Secondary | ICD-10-CM

## 2021-08-07 DIAGNOSIS — Z3687 Encounter for antenatal screening for uncertain dates: Secondary | ICD-10-CM

## 2021-08-18 ENCOUNTER — Ambulatory Visit (INDEPENDENT_AMBULATORY_CARE_PROVIDER_SITE_OTHER): Payer: Medicaid Other

## 2021-08-18 DIAGNOSIS — O0940 Supervision of pregnancy with grand multiparity, unspecified trimester: Secondary | ICD-10-CM

## 2021-08-18 DIAGNOSIS — Z3480 Encounter for supervision of other normal pregnancy, unspecified trimester: Secondary | ICD-10-CM

## 2021-08-18 DIAGNOSIS — Z3A00 Weeks of gestation of pregnancy not specified: Secondary | ICD-10-CM

## 2021-08-18 DIAGNOSIS — O0990 Supervision of high risk pregnancy, unspecified, unspecified trimester: Secondary | ICD-10-CM | POA: Insufficient documentation

## 2021-08-18 NOTE — Progress Notes (Signed)
New OB Intake  I connected with  [name] on 08/18/21 at  9:15 AM EDT by telephone Video Visit and verified that I am speaking with the correct person using two identifiers. Nurse is located at [facility] and pt is located at home.  I explained I am completing New OB Intake today. We discussed her EDD of 03/12/2022 that is based on LMP of 06/05/2021. Pt is G5/P2022. I reviewed her allergies, medications, Medical/Surgical/OB history, and appropriate screenings. Based on history, this is a/an pregnancy uncomplicated .   Patient Active Problem List   Diagnosis Date Noted   Biliary colic 07/04/2019   Drug use 05/11/2017   Tobacco user 01/11/2017   History of C-section 11/29/2016   Depressive disorder 07/19/2010   Extrinsic asthma 04/03/2009    Concerns addressed today None.  Delivery Plans:  Plans to deliver at [facility]  Anatomy US Explained first [place] will be scheduled between now and 09/03/21.  Labs Discussed genetic screening with patient. Patient desires genetic testing to be drawn withnew OB labs.  Discussed possible labs to be drawn at new OB appointment.  COVID Vaccine Patient has not had COVID vaccine.   Social Determinants of Health Food Insecurity: denies food insecurity Transportation: Patient denies transportation needs.  First visit review I reviewed new OB appt with pt. I explained she will have ob bloodwork and pap smear/pelvic exam if indicated. Explained pt will be seen by Doreene Burke, CNM at first visit; encounter routed to appropriate provider.   Loran Senters, [facility] 08/18/2021  9:37 AM  Clinical Staff Provider  Office Location  [facility] Dating    Language  English Anatomy US    Flu Vaccine  offer Genetic Screen  NIPS:   TDaP vaccine   offer Hgb A1C or  GTT Early : Third trimester :   Covid declines   LAB RESULTS   Rhogam   Blood Type     Feeding Plan formula Antibody    Contraception tubal Rubella     Circumcision yes RPR     Pediatrician  Undecided  HBsAg     Support Person FOB HIV    Prenatal Classes no Varicella     GBS  (For PCN allergy, check sensitivities)   BTL Consent desires Hep C   VBAC Consent  Pap      Hgb Electro      CF      SMA

## 2021-08-27 ENCOUNTER — Other Ambulatory Visit: Payer: Self-pay

## 2021-08-27 ENCOUNTER — Other Ambulatory Visit: Payer: Medicaid Other

## 2021-08-27 DIAGNOSIS — Z3480 Encounter for supervision of other normal pregnancy, unspecified trimester: Secondary | ICD-10-CM

## 2021-08-27 LAB — OB RESULTS CONSOLE VARICELLA ZOSTER ANTIBODY, IGG: Varicella: IMMUNE

## 2021-08-28 LAB — CBC/D/PLT+RPR+RH+ABO+RUBIGG...
Antibody Screen: NEGATIVE
Basophils Absolute: 0 10*3/uL (ref 0.0–0.2)
Basos: 1 %
EOS (ABSOLUTE): 0.1 10*3/uL (ref 0.0–0.4)
Eos: 1 %
HCV Ab: NONREACTIVE
HIV Screen 4th Generation wRfx: NONREACTIVE
Hematocrit: 39.8 % (ref 34.0–46.6)
Hemoglobin: 13.1 g/dL (ref 11.1–15.9)
Hepatitis B Surface Ag: NEGATIVE
Immature Grans (Abs): 0 10*3/uL (ref 0.0–0.1)
Immature Granulocytes: 0 %
Lymphocytes Absolute: 2.4 10*3/uL (ref 0.7–3.1)
Lymphs: 37 %
MCH: 29.6 pg (ref 26.6–33.0)
MCHC: 32.9 g/dL (ref 31.5–35.7)
MCV: 90 fL (ref 79–97)
Monocytes Absolute: 0.3 10*3/uL (ref 0.1–0.9)
Monocytes: 5 %
Neutrophils Absolute: 3.7 10*3/uL (ref 1.4–7.0)
Neutrophils: 56 %
Platelets: 211 10*3/uL (ref 150–450)
RBC: 4.42 x10E6/uL (ref 3.77–5.28)
RDW: 12.6 % (ref 11.7–15.4)
RPR Ser Ql: NONREACTIVE
Rh Factor: POSITIVE
Rubella Antibodies, IGG: 1.3 index (ref >0.99)
Varicella zoster IgG: 566 index (ref >165)
WBC: 6.5 10*3/uL (ref 3.4–10.8)

## 2021-08-28 LAB — URINALYSIS, ROUTINE W REFLEX MICROSCOPIC
Bilirubin, UA: NEGATIVE
Glucose, UA: NEGATIVE
Ketones, UA: NEGATIVE
Leukocytes,UA: NEGATIVE
Nitrite, UA: NEGATIVE
Protein,UA: NEGATIVE
RBC, UA: NEGATIVE
Specific Gravity, UA: 1.025 (ref 1.005–1.030)
Urobilinogen, Ur: 0.2 mg/dL (ref 0.2–1.0)
pH, UA: 6.5 (ref 5.0–7.5)

## 2021-08-28 LAB — HCV INTERPRETATION

## 2021-08-30 LAB — URINE CULTURE, OB REFLEX

## 2021-08-30 LAB — GC/CHLAMYDIA PROBE AMP
Chlamydia trachomatis, NAA: NEGATIVE
Neisseria Gonorrhoeae by PCR: NEGATIVE

## 2021-08-30 LAB — CULTURE, OB URINE

## 2021-09-01 LAB — MONITOR DRUG PROFILE 14(MW)
Amphetamine Scrn, Ur: NEGATIVE ng/mL
BARBITURATE SCREEN URINE: NEGATIVE ng/mL
BENZODIAZEPINE SCREEN, URINE: NEGATIVE ng/mL
Buprenorphine, Urine: NEGATIVE ng/mL
Cocaine (Metab) Scrn, Ur: NEGATIVE ng/mL
Creatinine(Crt), U: 138.4 mg/dL (ref 20.0–300.0)
Fentanyl, Urine: NEGATIVE pg/mL
Meperidine Screen, Urine: NEGATIVE ng/mL
Methadone Screen, Urine: NEGATIVE ng/mL
OXYCODONE+OXYMORPHONE UR QL SCN: NEGATIVE ng/mL
Opiate Scrn, Ur: NEGATIVE ng/mL
Ph of Urine: 6 (ref 4.5–8.9)
Phencyclidine Qn, Ur: NEGATIVE ng/mL
Propoxyphene Scrn, Ur: NEGATIVE ng/mL
SPECIFIC GRAVITY: 1.023
Tramadol Screen, Urine: NEGATIVE ng/mL

## 2021-09-01 LAB — MATERNIT 21 PLUS CORE, BLOOD
Fetal Fraction: 6
Result (T21): NEGATIVE
Trisomy 13 (Patau syndrome): NEGATIVE
Trisomy 18 (Edwards syndrome): NEGATIVE
Trisomy 21 (Down syndrome): NEGATIVE

## 2021-09-01 LAB — CANNABINOID (GC/MS), URINE
Cannabinoid: POSITIVE — AB
Carboxy THC (GC/MS): 463 ng/mL

## 2021-09-03 ENCOUNTER — Ambulatory Visit (INDEPENDENT_AMBULATORY_CARE_PROVIDER_SITE_OTHER): Payer: Medicaid Other | Admitting: Certified Nurse Midwife

## 2021-09-03 VITALS — BP 131/80 | HR 88 | Wt 109.9 lb

## 2021-09-03 DIAGNOSIS — Z3A12 12 weeks gestation of pregnancy: Secondary | ICD-10-CM

## 2021-09-03 NOTE — Patient Instructions (Signed)
Prenatal Care Prenatal care is health care during pregnancy. It helps you and your unborn baby (fetus) stay as healthy as possible. Prenatal care may be provided by a midwife, a family practice doctor, a mid-level practitioner (nurse practitioner or physician assistant), or a childbirth and pregnancy doctor (obstetrician). How does this affect me? During pregnancy, you will be closely monitored for any new conditions that might develop. To lower your risk of pregnancy complications, you and your health care provider will talk about any underlying conditions you have. How does this affect my baby? Early and consistent prenatal care increases the chance that your baby will be healthy during pregnancy. Prenatal care lowers the risk that your baby will be: Born early (prematurely). Smaller than expected at birth (small for gestational age). What can I expect at the first prenatal care visit? Your first prenatal care visit will likely be the longest. You should schedule your first prenatal care visit as soon as you know that you are pregnant. Your first visit is a good time to talk about any questions or concerns you have about pregnancy. Medical history At your visit, you and your health care provider will talk about your medical history, including: Any past pregnancies. Your family's medical history. Medical history of the baby's father. Any long-term (chronic) health conditions you have and how you manage them. Any surgeries or procedures you have had. Any current over-the-counter or prescription medicines, herbs, or supplements that you are taking. Other factors that could pose a risk to your baby, including: Exposure to harmful chemicals or radiation at work or at home. Any substance use, including tobacco, alcohol, and drug use. Your home setting and your stress levels, including: Exposure to abuse or violence. Household financial strain. Your daily health habits, including diet and  exercise. Tests and screenings Your health care provider will: Measure your weight, height, and blood pressure. Do a physical exam, including a pelvic and breast exam. Perform blood tests and urine tests to check for: Urinary tract infection. Sexually transmitted infections (STIs). Low iron levels in your blood (anemia). Blood type and certain proteins on red blood cells (Rh antibodies). Infections and immunity to viruses, such as hepatitis B and rubella. HIV (human immunodeficiency virus). Discuss your options for genetic screening. Tips about staying healthy Your health care provider will also give you information about how to keep yourself and your baby healthy, including: Nutrition and taking vitamins. Physical activity. How to manage pregnancy symptoms such as nausea and vomiting (morning sickness). Infections and substances that may be harmful to your baby and how to avoid them. Food safety. Dental care. Working. Travel. Warning signs to watch for and when to call your health care provider. How often will I have prenatal care visits? After your first prenatal care visit, you will have regular visits throughout your pregnancy. The visit schedule is often as follows: Up to week 28 of pregnancy: once every 4 weeks. 28-36 weeks: once every 2 weeks. After 36 weeks: every week until delivery. Some women may have visits more or less often depending on any underlying health conditions and the health of the baby. Keep all follow-up and prenatal care visits. This is important. What happens during routine prenatal care visits? Your health care provider will: Measure your weight and blood pressure. Check for fetal heart sounds. Measure the height of your uterus in your abdomen (fundal height). This may be measured starting around week 20 of pregnancy. Check the position of your baby inside your uterus. Ask questions   about your diet, sleeping patterns, and whether you can feel the baby  move. Review warning signs to watch for and signs of labor. Ask about any pregnancy symptoms you are having and how you are dealing with them. Symptoms may include: Headaches. Nausea and vomiting. Vaginal discharge. Swelling. Fatigue. Constipation. Changes in your vision. Feeling persistently sad or anxious. Any discomfort, including back or pelvic pain. Bleeding or spotting. Make a list of questions to ask your health care provider at your routine visits. What tests might I have during prenatal care visits? You may have blood, urine, and imaging tests throughout your pregnancy, such as: Urine tests to check for glucose, protein, or signs of infection. Glucose tests to check for a form of diabetes that can develop during pregnancy (gestational diabetes mellitus). This is usually done around week 24 of pregnancy. Ultrasounds to check your baby's growth and development, to check for birth defects, and to check your baby's well-being. These can also help to decide when you should deliver your baby. A test to check for group B strep (GBS) infection. This is usually done around week 36 of pregnancy. Genetic testing. This may include blood, fluid, or tissue sampling, or imaging tests, such as an ultrasound. Some genetic tests are done during the first trimester and some are done during the second trimester. What else can I expect during prenatal care visits? Your health care provider may recommend getting certain vaccines during pregnancy. These may include: A yearly flu shot (annual influenza vaccine). This is especially important if you will be pregnant during flu season. Tdap (tetanus, diphtheria, pertussis) vaccine. Getting this vaccine during pregnancy can protect your baby from whooping cough (pertussis) after birth. This vaccine may be recommended between weeks 27 and 36 of pregnancy. A COVID-19 vaccine. Later in your pregnancy, your health care provider may give you information  about: Childbirth and breastfeeding classes. Choosing a health care provider for your baby. Umbilical cord banking. Breastfeeding. Birth control after your baby is born. The hospital labor and delivery unit and how to set up a tour. Registering at the hospital before you go into labor. Where to find more information Office on Women's Health: womenshealth.gov American Pregnancy Association: americanpregnancy.org March of Dimes: marchofdimes.org Summary Prenatal care helps you and your baby stay as healthy as possible during pregnancy. Your first prenatal care visit will most likely be the longest. You will have visits and tests throughout your pregnancy to monitor your health and your baby's health. Bring a list of questions to your visits to ask your health care provider. Make sure to keep all follow-up and prenatal care visits. This information is not intended to replace advice given to you by your health care provider. Make sure you discuss any questions you have with your health care provider. Document Revised: 11/29/2019 Document Reviewed: 11/29/2019 Elsevier Patient Education  2023 Elsevier Inc.  

## 2021-09-03 NOTE — Progress Notes (Signed)
NEW OB HISTORY AND PHYSICAL  SUBJECTIVE:       [name] is a 26 y.o. [id number] female, Patient's last menstrual period was 06/05/2021 (exact date)., Estimated Date of Delivery: 03/12/22, [redacted]w[redacted]d, presents today for establishment of Prenatal Care. She has no unusual complaints . Admits to marijuana use due to not having appetite.  Denies nausea just not hungry. Her history is significant for c/s x 1. !st one was for arrest of decent and Non reassuring FHT. Second one was repeat. She is unsure if she would like to have repeat or TOLAC.     Gynecologic History Patient's last menstrual period was 06/05/2021 (exact date). Normal Contraception: none Last Pap: 09/08/2020. Results were: normal  Obstetric History OB History  Gravida Para Term Preterm AB Living  5 2 2   2 2   SAB IAB Ectopic Multiple Live Births  2     0 2    # Outcome Date GA Lbr Len/2nd Weight Sex Delivery Anes PTL Lv  5 Current           4 Term 12/30/17 [redacted]w[redacted]d  6 lb 11.2 oz (3.04 kg) F CS-LTranv Spinal  LIV  3 Term 11/29/16 [redacted]w[redacted]d  6 lb 10.5 oz (3.02 kg) M CS-LTranv EPI  LIV  2 SAB 2015        ND  1 SAB 2012        ND    Past Medical History:  Diagnosis Date   Abdominal pain    Asthma    Hypertension    Nausea    Peptic ulcer disease    PONV (postoperative nausea and vomiting)    Pre-diabetes (history)     Past Surgical History:  Procedure Laterality Date   CESAREAN SECTION N/A 11/29/2016   Procedure: CESAREAN SECTION;  Surgeon: 01/29/2017, MD;  Location: [facility] ORS;  Service: Obstetrics;  Laterality: N/A;   CESAREAN SECTION N/A 12/30/2017   Procedure: REPEAT CESAREAN SECTION;  Surgeon: 13/03/2017, MD;  Location: [facility] ORS;  Service: Obstetrics;  Laterality: N/A;   CHOLECYSTECTOMY     TONSILLECTOMY     WISDOM TOOTH EXTRACTION      Current Outpatient Medications on File Prior to Visit  Medication Sig Dispense Refill   Doxylamine-Pyridoxine 10-10 MG TBEC Take 1 tablet by mouth 4 (four) times daily.  Day 1 &2: 2 tablet at bedtimeDay 3 : if symptoms persists 1 tablet am; 2 tablet at bedtimeDay 4: 1 tablet am, 1 tab afternoon, 2 tab at bedtime 120 tablet 5   Prenatal Multivit-Min-FA ([facility] PRENATAL GUMMY) 0.4 MG CHEW Chew 1 tablet by mouth daily. 90 tablet 3   [DISCONTINUED] lansoprazole (PREVACID) 30 MG capsule TAKE 1 CAPSULE (30 MG TOTAL) BY MOUTH DAILY AT 12 NOON. 90 capsule 1   No current facility-administered medications on file prior to visit.    Allergies  Allergen Reactions   Metformin And Related Shortness Of Breath   Sprintec 28 [Norgestimate-Eth Estradiol] Hives    Social History   Socioeconomic History   Marital status: Significant Other    Spouse name: Not on file   Number of children: 2   Years of education: 10   Highest education level: Not on file  Occupational History   Occupation: full time    Comment: DayCare  Tobacco Use   Smoking status: Former    Packs/day: 0.50    Types: Cigarettes   Smokeless tobacco: Never  Vaping Use   Vaping Use: Never used  Substance and Sexual Activity  Alcohol use: No   Drug use: Not Currently    Comment: Denies current use    Sexual activity: Yes    Partners: Male    Birth control/protection: None    Comment: Nexplanon   Other Topics Concern   Not on file  Social History Narrative   Lives with her kids and parents   Right Handed   Drinks 1-2 cups caffeine daily   Social Determinants of Health   Financial Resource Strain: Low Risk  (08/18/2021)   Overall Financial Resource Strain (CARDIA)    Difficulty of Paying Living Expenses: Not hard at all  Food Insecurity: No Food Insecurity (08/18/2021)   Hunger Vital Sign    Worried About Running Out of Food in the Last Year: Never true    Ran Out of Food in the Last Year: Never true  Transportation Needs: No Transportation Needs (08/18/2021)   PRAPARE - Administrator, Civil Service (Medical): No    Lack of Transportation (Non-Medical): No  Physical Activity:  Inactive (08/18/2021)   Exercise Vital Sign    Days of Exercise per Week: 0 days    Minutes of Exercise per Session: 0 min  Stress: No Stress Concern Present (08/18/2021)   [employer] of Occupational Health - Occupational Stress Questionnaire    Feeling of Stress : Not at all  Social Connections: Moderately Isolated (08/18/2021)   Social Connection and Isolation Panel [NHANES]    Frequency of Communication with Friends and Family: More than three times a week    Frequency of Social Gatherings with Friends and Family: Once a week    Attends Religious Services: Never    Database administrator or Organizations: No    Attends Banker Meetings: Never    Marital Status: Living with partner  Intimate Partner Violence: Not At Risk (08/18/2021)   Humiliation, Afraid, Rape, and Kick questionnaire    Fear of Current or Ex-Partner: No    Emotionally Abused: No    Physically Abused: No    Sexually Abused: No    Family History  Problem Relation Age of Onset   Cholelithiasis Mother    SIDS Brother    Cholelithiasis Maternal Grandmother    Ulcers Maternal Grandmother    Diabetes Maternal Grandfather    Celiac disease Neg Hx     The following portions of the patient's history were reviewed and updated as appropriate: allergies, current medications, past OB history, past medical history, past surgical history, past family history, past social history, and problem list.  Relationship: With father of baby Living with her grandparents and her children. Work: Daycare Exercise " very active Denies alcohol use and smoking. Admits to marijuana use 1-2 times daily.   OBJECTIVE: Initial Physical Exam (New OB)  GENERAL APPEARANCE: alert, well appearing, in no apparent distress, oriented to person, place and time HEAD: normocephalic, atraumatic MOUTH: mucous membranes moist, pharynx normal without lesions THYROID: no thyromegaly or masses present BREASTS: no masses noted, no  significant tenderness, no palpable axillary nodes, no skin changes LUNGS: clear to auscultation, no wheezes, rales or rhonchi, symmetric air entry HEART: regular rate and rhythm, no murmurs ABDOMEN: soft, nontender, nondistended, no abnormal masses, no epigastric pain and FHT present EXTREMITIES: no redness or tenderness in the calves or thighs, no edema, no limitation in range of motion, intact peripheral pulses SKIN: normal coloration and turgor, no rashes LYMPH NODES: no adenopathy palpable NEUROLOGIC: alert, oriented, normal speech, no focal findings or movement disorder noted  PELVIC EXAM Pt declines  ASSESSMENT: Normal pregnancy  PLAN: Prenatal care See orders New OB counseling: The patient has been given an overview regarding routine prenatal care. Recommendations regarding diet, weight gain, and exercise in pregnancy were given. Encouraged protein shakes.  Prenatal testing, optional genetic testing, carrier screening and ultrasound use in pregnancy were reviewed. Matnerit already completed. Benefits of Breast Feeding were discussed. The patient is encouraged to consider nursing her baby post partum.   Doreene Burke, CNM

## 2021-09-14 ENCOUNTER — Encounter: Payer: Medicaid Other | Admitting: Certified Nurse Midwife

## 2021-09-28 ENCOUNTER — Encounter: Payer: Medicaid Other | Admitting: Obstetrics

## 2021-09-29 ENCOUNTER — Encounter: Payer: Self-pay | Admitting: Certified Nurse Midwife

## 2021-09-29 ENCOUNTER — Ambulatory Visit (INDEPENDENT_AMBULATORY_CARE_PROVIDER_SITE_OTHER): Payer: Medicaid Other | Admitting: Certified Nurse Midwife

## 2021-09-29 VITALS — BP 125/76 | HR 98 | Wt 115.2 lb

## 2021-09-29 DIAGNOSIS — Z3A16 16 weeks gestation of pregnancy: Secondary | ICD-10-CM

## 2021-09-29 NOTE — Patient Instructions (Signed)
Round Ligament Pain  The round ligaments are a pair of cord-like tissues that help support the uterus. They can become a source of pain during pregnancy as the ligaments soften and stretch as the baby grows. The pain usually begins in the second trimester (13-28 weeks) of pregnancy, and should only last for a few seconds when it occurs. However, the pain can come and go until the baby is delivered. The pain does not cause harm to the baby. Round ligament pain is usually a short, sharp, and pinching pain, but it can also be a dull, lingering, and aching pain. The pain is felt in the lower side of the abdomen or in the groin. It usually starts deep in the groin and moves up to the outside of the hip area. The pain may happen when you: Suddenly change position, such as quickly going from a sitting to standing position. Do physical activity. Cough or sneeze. Follow these instructions at home: Managing pain  When the pain starts, relax. Then, try any of these methods to help with the pain: Sit down. Flex your knees up to your abdomen. Lie on your side with one pillow under your abdomen and another pillow between your legs. Sit in a warm bath for 15-20 minutes or until the pain goes away. General instructions Watch your condition for any changes. Move slowly when you sit down or stand up. Stop or reduce your physical activities if they cause pain. Avoid long walks if they cause pain. Take over-the-counter and prescription medicines only as told by your health care provider. Keep all follow-up visits. This is important. Contact a health care provider if: Your pain does not go away with treatment. You feel pain in your back that you did not have before. Your medicine is not helping. You have a fever or chills. You have nausea or vomiting. You have diarrhea. You have pain when you urinate. Get help right away if: You have pain that is a rhythmic, cramping pain similar to labor pains. Labor  pains are usually 2 minutes apart, last for about 1 minute, and involve a bearing down feeling or pressure in your pelvis. You have vaginal bleeding. These symptoms may represent a serious problem that is an emergency. Do not wait to see if the symptoms will go away. Get medical help right away. Call your local emergency services (911 in the [place] Do not drive yourself to the hospital. Summary Round ligament pain is felt in the lower abdomen or groin. This pain usually begins in the second trimester (13-28 weeks) and should only last for a few seconds when it occurs. You may notice the pain when you suddenly change position, when you cough or sneeze, or during physical activity. Relaxing, flexing your knees to your abdomen, lying on one side, or taking a warm bath may help to get rid of the pain. Contact your health care provider if the pain does not go away. This information is not intended to replace advice given to you by your health care provider. Make sure you discuss any questions you have with your health care provider. Document Revised: 04/30/2020 Document Reviewed: 04/30/2020 Elsevier Patient Education  2023 Elsevier Inc.  

## 2021-09-29 NOTE — Progress Notes (Signed)
ROB doing well, feels some fluttering. PT state she has decided she would like to have a repeat cesarean section. Discussed appointments with MD 32-34 wks for scheduling and again 38 wks for pre op. She prefers Dr. Valentino Saxon as she knows her and she did her last one. Discussed u/s next appointment for anatomy. She is in agreement. Follow up 4 wks.   Doreene Burke, CNM

## 2021-11-03 ENCOUNTER — Ambulatory Visit (INDEPENDENT_AMBULATORY_CARE_PROVIDER_SITE_OTHER): Payer: Medicaid Other

## 2021-11-03 ENCOUNTER — Ambulatory Visit (INDEPENDENT_AMBULATORY_CARE_PROVIDER_SITE_OTHER): Payer: Medicaid Other | Admitting: Obstetrics

## 2021-11-03 ENCOUNTER — Encounter: Payer: Self-pay | Admitting: Obstetrics

## 2021-11-03 VITALS — BP 117/74 | HR 76 | Wt 119.3 lb

## 2021-11-03 DIAGNOSIS — Z3A20 20 weeks gestation of pregnancy: Secondary | ICD-10-CM

## 2021-11-03 DIAGNOSIS — Z3689 Encounter for other specified antenatal screening: Secondary | ICD-10-CM | POA: Diagnosis not present

## 2021-11-03 DIAGNOSIS — Z3A16 16 weeks gestation of pregnancy: Secondary | ICD-10-CM

## 2021-11-03 DIAGNOSIS — Z3A21 21 weeks gestation of pregnancy: Secondary | ICD-10-CM

## 2021-11-03 LAB — POCT URINALYSIS DIPSTICK OB
Bilirubin, UA: NEGATIVE
Blood, UA: NEGATIVE
Glucose, UA: NEGATIVE
Ketones, UA: NEGATIVE
Leukocytes, UA: NEGATIVE
Nitrite, UA: NEGATIVE
POC,PROTEIN,UA: NEGATIVE
Spec Grav, UA: 1.02 (ref 1.010–1.025)
Urobilinogen, UA: 0.2 E.U./dL
pH, UA: 6 (ref 5.0–8.0)

## 2021-11-03 NOTE — Progress Notes (Signed)
ROB at [redacted]w[redacted]d. [place] today - measuring 10 days behind. [name] reports that [facility] is concerned about her low weight gain and needs rx for whole milk. Will fax info to [facility]. Consider growth scan at 28 weeks. [name] is feeling some small fetal movements. Appetite is poor but she has been trying to add protein shakes. Discussed good sources of protein and encouraged to increase calories from all sources. RTC in 3 4 weeks.  [name] Spanish, CNM

## 2021-11-04 ENCOUNTER — Telehealth: Payer: Self-pay | Admitting: Certified Nurse Midwife

## 2021-11-04 NOTE — Telephone Encounter (Signed)
Spoke with [facility] personal and have fixed the ICD 10 code for low weight in pregnancy per [facility] Kristen FAXED ON 11/04/2021

## 2021-11-04 NOTE — Telephone Encounter (Addendum)
Kristen from [facility] is calling about an wic form sent over for this patient. She needs an new form faxed explaining more detail then pregnancy for patient to be able to receive whole milk. Please call. Thank you! [phone]

## 2021-11-24 ENCOUNTER — Encounter: Payer: Medicaid Other | Admitting: Certified Nurse Midwife

## 2021-11-25 ENCOUNTER — Ambulatory Visit (INDEPENDENT_AMBULATORY_CARE_PROVIDER_SITE_OTHER): Payer: Medicaid Other | Admitting: Certified Nurse Midwife

## 2021-11-25 ENCOUNTER — Encounter: Payer: Self-pay | Admitting: Certified Nurse Midwife

## 2021-11-25 VITALS — BP 116/75 | HR 96 | Wt 115.0 lb

## 2021-11-25 DIAGNOSIS — Z3A24 24 weeks gestation of pregnancy: Secondary | ICD-10-CM

## 2021-11-25 NOTE — Progress Notes (Signed)
ROB doing well, feeling good fetal movement. Discussed glucose screening and u/s next visit. Pt has had poor weight gain and anatomy u/s showed fetus at 10 days behind. Growth scheduled for 28 wks. Pt state she has increased protein and is taking in protein shakes. She has some nausea but is not having vomiting. Pt encouraged to continue protein shakes. Information sheet given on eating prior to glucose testing. She verbalizes and agree. Follow up 4 wks.   Philip Aspen, CNM

## 2021-11-25 NOTE — Patient Instructions (Signed)
Oral Glucose Tolerance Test During Pregnancy Why am I having this test? The oral glucose tolerance test (OGTT) is done to check how your body processes blood sugar (glucose). This is one of several tests used to diagnose diabetes that develops during pregnancy (gestational diabetes mellitus). Gestational diabetes is a short-term form of diabetes that some women develop while they are pregnant. It usually occurs during the second trimester of pregnancy and goes away after delivery. Testing, or screening, for gestational diabetes usually occurs at weeks 24-28 of pregnancy. You may have the OGTT test after having a 1-hour glucose screening test if the results from that test indicate that you may have gestational diabetes. This test may also be needed if: You have a history of gestational diabetes. There is a history of giving birth to very large babies or of losing pregnancies (having stillbirths). You have signs and symptoms of diabetes, such as: Changes in your eyesight. Tingling or numbness in your hands or feet. Changes in hunger, thirst, and urination, and these are not explained by your pregnancy. What is being tested? This test measures the amount of glucose in your blood at different times during a period of 3 hours. This shows how well your body can process glucose. What kind of sample is taken?  Blood samples are required for this test. They are usually collected by inserting a needle into a blood vessel. How do I prepare for this test? For 3 days before your test, eat normally. Have plenty of carbohydrate-rich foods. Follow instructions from your health care provider about: Eating or drinking restrictions on the day of the test. You may be asked not to eat or drink anything other than water (to fast) starting 8-10 hours before the test. Changing or stopping your regular medicines. Some medicines may interfere with this test. Tell a health care provider about: All medicines you are  taking, including vitamins, herbs, eye drops, creams, and over-the-counter medicines. Any blood disorders you have. Any surgeries you have had. Any medical conditions you have. What happens during the test? First, your blood glucose will be measured. This is referred to as your fasting blood glucose because you fasted before the test. Then, you will drink a glucose solution that contains a certain amount of glucose. Your blood glucose will be measured again 1, 2, and 3 hours after you drink the solution. This test takes about 3 hours to complete. You will need to stay at the testing location during this time. During the testing period: Do not eat or drink anything other than the glucose solution. Do not exercise. Do not use any products that contain nicotine or tobacco, such as cigarettes, e-cigarettes, and chewing tobacco. These can affect your test results. If you need help quitting, ask your health care provider. The testing procedure may vary among health care providers and hospitals. How are the results reported? Your results will be reported as milligrams of glucose per deciliter of blood (mg/dL) or millimoles per liter (mmol/L). There is more than one source for screening and diagnosis reference values used to diagnose gestational diabetes. Your health care provider will compare your results to normal values that were established after testing a large group of people (reference values). Reference values may vary among labs and hospitals. For this test (Carpenter-Coustan), reference values are: Fasting: 95 mg/dL (5.3 mmol/L). 1 hour: 180 mg/dL (10.0 mmol/L). 2 hour: 155 mg/dL (8.6 mmol/L). 3 hour: 140 mg/dL (7.8 mmol/L). What do the results mean? Results below the reference values are   considered normal. If two or more of your blood glucose levels are at or above the reference values, you may be diagnosed with gestational diabetes. If only one level is high, your health care provider may  suggest repeat testing or other tests to confirm a diagnosis. Talk with your health care provider about what your results mean. Questions to ask your health care provider Ask your health care provider, or the department that is doing the test: When will my results be ready? How will I get my results? What are my treatment options? What other tests do I need? What are my next steps? Summary The oral glucose tolerance test (OGTT) is one of several tests used to diagnose diabetes that develops during pregnancy (gestational diabetes mellitus). Gestational diabetes is a short-term form of diabetes that some women develop while they are pregnant. You may have the OGTT test after having a 1-hour glucose screening test if the results from that test show that you may have gestational diabetes. You may also have this test if you have any symptoms or risk factors for this type of diabetes. Talk with your health care provider about what your results mean. This information is not intended to replace advice given to you by your health care provider. Make sure you discuss any questions you have with your health care provider. Document Revised: 07/26/2019 Document Reviewed: 07/26/2019 Elsevier Patient Education  2023 Elsevier Inc.  

## 2021-12-11 ENCOUNTER — Other Ambulatory Visit: Payer: Self-pay | Admitting: Certified Nurse Midwife

## 2021-12-11 ENCOUNTER — Telehealth: Payer: Self-pay

## 2021-12-11 ENCOUNTER — Encounter: Payer: Self-pay | Admitting: Obstetrics and Gynecology

## 2021-12-11 NOTE — Telephone Encounter (Signed)
Pt called triage needing [place] to add more info to her Wic letter so they could give her the whole milk, I added she should be given Whole milk for weight gaining concerns, Letter faxed to [facility] [phone]

## 2021-12-11 NOTE — Telephone Encounter (Signed)
Wic office will fax [place] over a paper to fill out.

## 2021-12-11 NOTE — Telephone Encounter (Signed)
Kristen from [facility] calling regarding the letter I faxed for Pt to receive whole milk. She states this needs to be on prescription paper. Can you write an RX for Whole Milk? Fax is [phone] and Phone is [phone]

## 2021-12-14 NOTE — Telephone Encounter (Signed)
I have not received anything this morning

## 2021-12-14 NOTE — Telephone Encounter (Signed)
Form completed by Barbette Merino  faxed back to [facility].

## 2021-12-22 ENCOUNTER — Other Ambulatory Visit: Payer: Self-pay

## 2021-12-22 DIAGNOSIS — Z3A28 28 weeks gestation of pregnancy: Secondary | ICD-10-CM

## 2021-12-23 ENCOUNTER — Ambulatory Visit (INDEPENDENT_AMBULATORY_CARE_PROVIDER_SITE_OTHER): Payer: Medicaid Other | Admitting: Advanced Practice Midwife

## 2021-12-23 ENCOUNTER — Ambulatory Visit (INDEPENDENT_AMBULATORY_CARE_PROVIDER_SITE_OTHER): Payer: Medicaid Other

## 2021-12-23 ENCOUNTER — Other Ambulatory Visit: Payer: Self-pay

## 2021-12-23 ENCOUNTER — Other Ambulatory Visit: Payer: Medicaid Other

## 2021-12-23 ENCOUNTER — Other Ambulatory Visit: Payer: Self-pay | Admitting: Certified Nurse Midwife

## 2021-12-23 ENCOUNTER — Encounter: Payer: Self-pay | Admitting: Advanced Practice Midwife

## 2021-12-23 VITALS — BP 104/57 | Wt 124.0 lb

## 2021-12-23 DIAGNOSIS — O365920 Maternal care for other known or suspected poor fetal growth, second trimester, not applicable or unspecified: Secondary | ICD-10-CM | POA: Diagnosis not present

## 2021-12-23 DIAGNOSIS — O365931 Maternal care for other known or suspected poor fetal growth, third trimester, fetus 1: Secondary | ICD-10-CM

## 2021-12-23 DIAGNOSIS — Z3A24 24 weeks gestation of pregnancy: Secondary | ICD-10-CM

## 2021-12-23 DIAGNOSIS — Z3A26 26 weeks gestation of pregnancy: Secondary | ICD-10-CM | POA: Diagnosis not present

## 2021-12-23 DIAGNOSIS — O0993 Supervision of high risk pregnancy, unspecified, third trimester: Secondary | ICD-10-CM

## 2021-12-23 DIAGNOSIS — Z369 Encounter for antenatal screening, unspecified: Secondary | ICD-10-CM

## 2021-12-23 DIAGNOSIS — Z3A28 28 weeks gestation of pregnancy: Secondary | ICD-10-CM

## 2021-12-23 DIAGNOSIS — O365930 Maternal care for other known or suspected poor fetal growth, third trimester, not applicable or unspecified: Secondary | ICD-10-CM

## 2021-12-23 DIAGNOSIS — O365990 Maternal care for other known or suspected poor fetal growth, unspecified trimester, not applicable or unspecified: Secondary | ICD-10-CM | POA: Insufficient documentation

## 2021-12-23 NOTE — Patient Instructions (Signed)
Fetal Growth Restriction  Fetal growth restriction, also known as intrauterine growth restriction (IUGR), is when a baby is not growing normally during pregnancy. A baby with fetal growth restriction is smaller than he or she should be and may weigh less than normal at birth. Fetal growth restriction can result from a problem with the placenta, which is an organ that supplies the unborn baby (fetus) with oxygen and nutrition. Babies with fetal growth restriction are at higher risk for early delivery and may need more care than usual after birth. What are the causes? The most common cause of fetal growth restriction is a problem with the placenta or umbilical cord that causes the fetus to get less oxygen or nutrition than needed. Other causes include: Poor maternal nutrition and not enough weight gain during pregnancy. Exposure to chemicals found in substances such as cigarettes, alcohol, and some drugs. Some prescription medicines. Other problems that develop in the womb (congenital birth defects). Genetic disorders. Infection. Carrying more than one baby. What increases the risk? This condition is more likely to affect your baby if you: Are older than age 26 or younger than age 42. Have medical conditions such as high blood pressure, preeclampsia, diabetes, heart or kidney disease, systemic lupus erythematosus, or anemia. Live at a very high altitude during pregnancy. Have a personal history or family history of: Fetal growth restriction. A genetic disorder. Have had treatments to help have children (infertility treatments). What are the signs or symptoms? Fetal growth restriction does not cause many symptoms. Your health care provider may suspect this condition if your belly area (fundus) is not as big as expected for the stage of your pregnancy. How is this diagnosed? This condition is diagnosed with physical exams and prenatal exams. You may also have: Fundal height measurements to  check the size of your uterus. The fundal height is the distance from the pubic bone to the top of the uterus. An ultrasound done to measure your baby's size compared to the size of other babies at the same stage of development (gestational age). You may also have tests to find the cause of fetal growth restriction. These may include: Amniocentesis. This is a procedure that involves passing a needle into the uterus to collect a sample of fluid that surrounds the fetus (amniotic fluid). This may be done to check for signs of infection or congenital defects. Tests to evaluate blood flow to your baby and placenta. How is this treated? In most cases, the goal of treatment is to treat the cause of fetal growth restriction. Your health care providers will monitor your pregnancy closely and help you manage your pregnancy. If your condition is caused by a problem with the placenta and your baby is not getting enough blood, you may need: Medicine to start labor and deliver your baby early (induction). Cesarean delivery, also called a C-section. In this procedure, your baby is delivered through an incision in your abdomen and uterus. Follow these instructions at home: Medicines Take over-the-counter and prescription medicines only as told by your health care provider. This includes vitamins and supplements. Make sure that your health care provider knows about and approves of all medicines, supplements, vitamins, eye drops, and creams that you use. General instructions Eat a healthy diet that includes fresh fruits and vegetables, lean proteins, whole grains, and calcium-rich foods such as milk, yogurt, and dark, leafy greens. Work with your health care provider or a dietitian to make sure that: You are getting enough nutrients. You are  gaining enough weight during your pregnancy. Rest as needed. Try to get at least 8 hours of sleep every night. Do not drink alcohol or use drugs. Do not use any products that  contain nicotine or tobacco. These products include cigarettes, chewing tobacco, and vaping devices, such as e-cigarettes. If you need help quitting, ask your health care provider. Keep all follow-up visits. This is important. Get help right away if: You notice that your unborn baby is moving less than usual or is not moving. You have contractions that are 5 minutes or less apart, or that increase in frequency, intensity, or length. You have signs and symptoms of infection, including a fever. You have vaginal bleeding. You have increased swelling in your legs, hands, or face. You have vision changes, including seeing spots or having blurry or double vision. You have a severe headache that does not go away. You have a sudden, sharp pain in the abdomen or low back pain. You have an uncontrolled gush or trickle of fluid from your vagina. Summary Fetal growth restriction is when a baby is not growing normally during pregnancy. The most common cause of fetal growth restriction is a problem with the placenta or umbilical cord that causes the fetus to get less oxygen or nutrition than needed. This condition is diagnosed with physical and prenatal exams. Your health care provider will monitor your baby's growth with ultrasounds throughout pregnancy. Make sure that your health care provider knows about and approves of all medicines, supplements, vitamins, eye drops, and creams that you use. This information is not intended to replace advice given to you by your health care provider. Make sure you discuss any questions you have with your health care provider. Document Revised: 10/08/2019 Document Reviewed: 10/08/2019 Elsevier Patient Education  2023 [employer].

## 2021-12-23 NOTE — Progress Notes (Signed)
Routine Prenatal Care Visit  Subjective  [name] is a 26 y.o. [id number] at [redacted]w[redacted]d being seen today for ongoing prenatal care.  She is currently monitored for the following issues for this high-risk pregnancy and has Depressive disorder; Extrinsic asthma; History of C-section; Drug use; Biliary colic; Supervision of high-risk pregnancy; and IUGR (intrauterine growth restriction) affecting care of mother on their problem list.  ----------------------------------------------------------------------------------- Patient reports no complaints.  She had growth scan today. Report not available yet, however, doppler studies were done and patient reports that growth is less than 10th percentile. Referral sent to MFM for follow up scans/dopplers. Contractions: Not present. Vag. Bleeding: None.  Movement: Present. Leaking Fluid denies.  ----------------------------------------------------------------------------------- The following portions of the patient's history were reviewed and updated as appropriate: allergies, current medications, past family history, past medical history, past social history, past surgical history and problem list. Problem list updated.  Objective  Blood pressure (!) 104/57, weight 124 lb (56.2 kg), last menstrual period 06/05/2021. Pregravid weight 115 lb (52.2 kg) Total Weight Gain 9 lb (4.082 kg) Urinalysis: Urine Protein    Urine Glucose    Fetal Status: Fetal Heart Rate (bpm): 145 Fundal Height: 26 cm Movement: Present     General:  Alert, oriented and cooperative. Patient is in no acute distress.  Skin: Skin is warm and dry. No rash noted.   Cardiovascular: Normal heart rate noted  Respiratory: Normal respiratory effort, no problems with respiration noted  Abdomen: Soft, gravid, appropriate for gestational age. Pain/Pressure: Absent     Pelvic:  Cervical exam deferred        Extremities: Normal range of motion.  Edema: None  Mental Status: Normal mood and affect.  Normal behavior. Normal judgment and thought content.   Assessment   26 y.o. [id number] at [redacted]w[redacted]d by  03/12/2022, by Last Menstrual Period presenting for routine prenatal visit  Plan   May 2023; fifth Problems (from 07/22/21 to present)    Problem Noted Resolved   IUGR (intrauterine growth restriction) affecting care of mother 12/23/2021 by Rod Can, CNM No   Supervision of high-risk pregnancy 08/18/2021 by Cleophas Dunker, [place] No   Overview Signed 12/23/2021  1:27 PM by Rod Can, CNM     Nursing Staff Provider  Office Location  [facility] Dating    Language  English Anatomy US    Flu Vaccine   Genetic Screen  NIPS:   TDaP vaccine    Hgb A1C or  GTT Early : Third trimester :   Covid    LAB RESULTS   Rhogam   Blood Type O/Positive/-- (06/29 0815)   Feeding Plan  Antibody Negative (06/29 0815)  Contraception  Rubella 1.30 (06/29 0815)  Circumcision  RPR Non Reactive (06/29 0815)   Pediatrician   HBsAg Negative (06/29 0815)   Support Person  HIV Non Reactive (06/29 0815)  Prenatal Classes  Varicella     GBS  (For PCN allergy, check sensitivities)   BTL Consent     VBAC Consent  Pap      Hgb Electro    Pelvis Tested  CF      SMA                   Preterm labor symptoms and general obstetric precautions including but not limited to vaginal bleeding, contractions, leaking of fluid and fetal movement were reviewed in detail with the patient. Please refer to After Visit Summary for other counseling recommendations.   Return in about 2 weeks (around  01/06/2022) for rob.  Rod Can, CNM 12/23/2021 1:41 PM

## 2021-12-24 LAB — CBC
Hematocrit: 34 % (ref 34.0–46.6)
Hemoglobin: 11.4 g/dL (ref 11.1–15.9)
MCH: 30.7 pg (ref 26.6–33.0)
MCHC: 33.5 g/dL (ref 31.5–35.7)
MCV: 92 fL (ref 79–97)
Platelets: 224 10*3/uL (ref 150–450)
RBC: 3.71 x10E6/uL — ABNORMAL LOW (ref 3.77–5.28)
RDW: 13.1 % (ref 11.7–15.4)
WBC: 9.7 10*3/uL (ref 3.4–10.8)

## 2021-12-24 LAB — RPR: RPR Ser Ql: NONREACTIVE

## 2021-12-24 LAB — GLUCOSE, 1 HOUR GESTATIONAL: Gestational Diabetes Screen: 74 mg/dL (ref 70–139)

## 2022-01-05 ENCOUNTER — Ambulatory Visit: Payer: Medicaid Other | Admitting: *Deleted

## 2022-01-05 ENCOUNTER — Ambulatory Visit (HOSPITAL_BASED_OUTPATIENT_CLINIC_OR_DEPARTMENT_OTHER): Payer: Medicaid Other | Admitting: *Deleted

## 2022-01-05 ENCOUNTER — Ambulatory Visit (HOSPITAL_BASED_OUTPATIENT_CLINIC_OR_DEPARTMENT_OTHER): Payer: Medicaid Other | Admitting: Maternal & Fetal Medicine

## 2022-01-05 ENCOUNTER — Other Ambulatory Visit: Payer: Self-pay | Admitting: *Deleted

## 2022-01-05 ENCOUNTER — Ambulatory Visit: Payer: Medicaid Other | Attending: Advanced Practice Midwife

## 2022-01-05 VITALS — BP 108/61 | HR 77

## 2022-01-05 DIAGNOSIS — Z3A31 31 weeks gestation of pregnancy: Secondary | ICD-10-CM

## 2022-01-05 DIAGNOSIS — O365931 Maternal care for other known or suspected poor fetal growth, third trimester, fetus 1: Secondary | ICD-10-CM | POA: Diagnosis present

## 2022-01-05 DIAGNOSIS — O34219 Maternal care for unspecified type scar from previous cesarean delivery: Secondary | ICD-10-CM

## 2022-01-05 DIAGNOSIS — O365910 Maternal care for other known or suspected poor fetal growth, first trimester, not applicable or unspecified: Secondary | ICD-10-CM

## 2022-01-05 DIAGNOSIS — Z3A30 30 weeks gestation of pregnancy: Secondary | ICD-10-CM

## 2022-01-05 DIAGNOSIS — O0993 Supervision of high risk pregnancy, unspecified, third trimester: Secondary | ICD-10-CM | POA: Diagnosis present

## 2022-01-05 DIAGNOSIS — Z369 Encounter for antenatal screening, unspecified: Secondary | ICD-10-CM | POA: Diagnosis present

## 2022-01-05 DIAGNOSIS — O365930 Maternal care for other known or suspected poor fetal growth, third trimester, not applicable or unspecified: Secondary | ICD-10-CM

## 2022-01-05 NOTE — Progress Notes (Deleted)
MFM Consult Note Patient Name: [name]  Patient MRN:   [id number]  Referring provider: Startup Reason for Consult: FGR   HPI: [name] is a 26 y.o. H8N2778 at [redacted]w[redacted]d  here for ultrasound and consultation.   Counseling  I discussed the finding of fetal growth restriction (FGR) with the patient today. The ultrasound shows an overall growth at the 1.5 percentile and the abdominal circumference at the 4th percentile. The umbilical artery Dopplers are normal. NST was reactive.  I counseled her about the clinical significance of the Doppler findings and antenatal testing. Her prior to fetuses were 6lbs 11oz.   I discussed the various causes of growth restriction including constitutionally small fetus, placental insufficiency, genetic problems and chronic maternal disease. Currently there is no evidence of sonographic stigmata suggesting infection or aneuploidy.  I discussed the most likely cause of her fetal growth restriction is either a constitutionally small fetus or placental insufficiency. We discussed it is often challenging to differentiate between a fetus that is constitutionally small but is fulfilling its growth potential and a fetus that is not fulfilling its growth potential because of an underlying pathologic condition. Approximately 70% of fetuses with birth weight below the 10th percentile for gestational age are constitutionally small; in the remaining 30%, the cause of the small size is pathologic, meeting intrauterine growth restriction definition. I also discussed the importance of antenatal fetal surveillance including antenatal testing and umbilical artery Doppler assessment to reduce the risk of stillbirth.  I discussed the management going forward in the pregnancy with potential alteration in the timing of delivery. Currently she feels well and denies headache, vision changes, right upper quadrant pain, contractions, vaginal bleeding or loss of fluid.  She reports good fetal  movement.  Review of Systems: A review of systems was performed and was negative except per HPI   Vitals and Physical Exam See intake sheet for vitals Sitting comfortably on the sonogram table Nonlabored breathing Normal rate and rhythm Abdomen is nontender  Genetic testing: Low risk NIPS  Sonographic findings Single intrauterine pregnancy. Observed fetal cardiac activity. Cephalic presentation. Fetal anatomy that was well seen appears normal without evidence of soft markers. Not all fetal structures were well seen due to a technically diffcult exam and the anatomic survey remains incomplete with limited views of the heart view, profile, ACI, and spine skin line. Fetal biometry shows the estimated fetal weight at the 1.5 percentile.  Amniotic fluid volume: Within normal limits. Placenta: Anterior. Cervix: Not visualized (advanced GA >24wks) with a cervical length of  cm. Adnexa: No masses visualized.  Umbilical artery dopplers findings: -S/D:3.1 which are normal at this gestational age.  -Absent end-diastolic flow: No.  -Reversed end-diastolic flow:  No.  I discussed the limitations of prenatal ultrasound with the patient including inability to detect certain abnormalities and poor visualization of certain anatomic structures due to fetal position, gestational age and maternal body habitus.  The patient verbalized understanding and had time to ask questions that were answered to her satisfaction.     Assessment/Plan FGR TCH - Continue weekly UA dopplers until delivery.  - Continue to avoid tobacco use and exposure to harmful substances. The patient denies current use.  - Weekly NST to start at 28 weeks - Weekly BPP to start at 32 weeks.  - Betamethasone indicated if absent or reversed flow is seen or antenatal testing is abnormal in the future  - Serial growth [place] every 3 weeks until delivery  - Delivery likley around 37 weeks  or sooner if inidcated.  - With cases of early  onset growth restriction with onset at less than 32 weeks, amniocentesis to assess chromosomal MicroArray and CMV is recommended.  The patient was offered this and declined.   The patient had time to ask questions which were answered to her satisfaction.  She verbalized understanding and request to proceed with the plan. I spent 45 minutes total in patient care including chart review and counseling with the patient.   Braxton Feathers [place] [facility]

## 2022-01-05 NOTE — Procedures (Signed)
[name] 1996/02/15 [redacted]w[redacted]d  Fetus A Non-Stress Test Interpretation for 01/05/22  Indication: IUGR  Fetal Heart Rate A Mode: External Baseline Rate (A): 125 bpm Variability: Moderate Accelerations: 10 x 10 Decelerations: None Multiple birth?: No  Uterine Activity Mode: Toco Contraction Frequency (min): none Resting Tone Palpated: Relaxed  Interpretation (Fetal Testing) Nonstress Test Interpretation: Reactive Overall Impression: Reassuring for gestational age Comments: tracing reviewed by Dr. Epimenio Sarin

## 2022-01-05 NOTE — Progress Notes (Signed)
MFM Consult Note Patient Name: [name]  Patient MRN:   [id number]  Referring provider: [employer] Reason for Consult: FGR   HPI: PEYSON DELAO is a 26 y.o. H8N2778 at [redacted]w[redacted]d  here for ultrasound and consultation.   Counseling  I discussed the finding of fetal growth restriction (FGR) with the patient today. The ultrasound shows an overall growth at the 1.5 percentile and the abdominal circumference at the 4th percentile. The umbilical artery Dopplers are normal. NST was reactive.  I counseled her about the clinical significance of the Doppler findings and antenatal testing. Her prior to fetuses were 6lbs 11oz.   I discussed the various causes of growth restriction including constitutionally small fetus, placental insufficiency, genetic problems and chronic maternal disease. Currently there is no evidence of sonographic stigmata suggesting infection or aneuploidy.  I discussed the most likely cause of her fetal growth restriction is either a constitutionally small fetus or placental insufficiency. We discussed it is often challenging to differentiate between a fetus that is constitutionally small but is fulfilling its growth potential and a fetus that is not fulfilling its growth potential because of an underlying pathologic condition. Approximately 70% of fetuses with birth weight below the 10th percentile for gestational age are constitutionally small; in the remaining 30%, the cause of the small size is pathologic, meeting intrauterine growth restriction definition. I also discussed the importance of antenatal fetal surveillance including antenatal testing and umbilical artery Doppler assessment to reduce the risk of stillbirth.  I discussed the management going forward in the pregnancy with potential alteration in the timing of delivery. Currently she feels well and denies headache, vision changes, right upper quadrant pain, contractions, vaginal bleeding or loss of fluid.  She reports good fetal  movement.  Review of Systems: A review of systems was performed and was negative except per HPI   Vitals and Physical Exam See intake sheet for vitals Sitting comfortably on the sonogram table Nonlabored breathing Normal rate and rhythm Abdomen is nontender  Genetic testing: Low risk NIPS  Sonographic findings Single intrauterine pregnancy. Observed fetal cardiac activity. Cephalic presentation. Fetal anatomy that was well seen appears normal without evidence of soft markers. Not all fetal structures were well seen due to a technically diffcult exam and the anatomic survey remains incomplete with limited views of the heart view, profile, ACI, and spine skin line. Fetal biometry shows the estimated fetal weight at the 1.5 percentile.  Amniotic fluid volume: Within normal limits. Placenta: Anterior. Cervix: Not visualized (advanced GA >24wks) with a cervical length of  cm. Adnexa: No masses visualized.  Umbilical artery dopplers findings: -S/D:3.1 which are normal at this gestational age.  -Absent end-diastolic flow: No.  -Reversed end-diastolic flow:  No.  I discussed the limitations of prenatal ultrasound with the patient including inability to detect certain abnormalities and poor visualization of certain anatomic structures due to fetal position, gestational age and maternal body habitus.  The patient verbalized understanding and had time to ask questions that were answered to her satisfaction.     Assessment/Plan FGR TCH - Continue weekly UA dopplers until delivery.  - Continue to avoid tobacco use and exposure to harmful substances. The patient denies current use.  - Weekly NST to start at 28 weeks - Weekly BPP to start at 32 weeks.  - Betamethasone indicated if absent or reversed flow is seen or antenatal testing is abnormal in the future  - Serial growth [place] every 3 weeks until delivery  - Delivery likley around 37 weeks  or sooner if inidcated.  - With cases of early  onset growth restriction with onset at less than 32 weeks, amniocentesis to assess chromosomal MicroArray and CMV is recommended.  The patient was offered this and declined.   The patient had time to ask questions which were answered to her satisfaction.  She verbalized understanding and request to proceed with the plan. I spent 45 minutes total in patient care including chart review and counseling with the patient.   Braxton Feathers [place] M[facility]

## 2022-01-06 ENCOUNTER — Other Ambulatory Visit: Payer: Self-pay | Admitting: *Deleted

## 2022-01-06 DIAGNOSIS — O34219 Maternal care for unspecified type scar from previous cesarean delivery: Secondary | ICD-10-CM

## 2022-01-06 DIAGNOSIS — O365931 Maternal care for other known or suspected poor fetal growth, third trimester, fetus 1: Secondary | ICD-10-CM

## 2022-01-08 ENCOUNTER — Ambulatory Visit (INDEPENDENT_AMBULATORY_CARE_PROVIDER_SITE_OTHER): Payer: Medicaid Other | Admitting: Obstetrics and Gynecology

## 2022-01-08 ENCOUNTER — Encounter: Payer: Self-pay | Admitting: Obstetrics and Gynecology

## 2022-01-08 VITALS — BP 109/70 | HR 114 | Wt 127.6 lb

## 2022-01-08 DIAGNOSIS — O365930 Maternal care for other known or suspected poor fetal growth, third trimester, not applicable or unspecified: Secondary | ICD-10-CM

## 2022-01-08 DIAGNOSIS — O0993 Supervision of high risk pregnancy, unspecified, third trimester: Secondary | ICD-10-CM

## 2022-01-08 DIAGNOSIS — Z98891 History of uterine scar from previous surgery: Secondary | ICD-10-CM

## 2022-01-08 DIAGNOSIS — Z3A31 31 weeks gestation of pregnancy: Secondary | ICD-10-CM

## 2022-01-08 LAB — POCT URINALYSIS DIPSTICK OB
Bilirubin, UA: NEGATIVE
Blood, UA: NEGATIVE
Glucose, UA: NEGATIVE
Ketones, UA: NEGATIVE
Leukocytes, UA: NEGATIVE
Nitrite, UA: NEGATIVE
Spec Grav, UA: 1.02 (ref 1.010–1.025)
Urobilinogen, UA: 0.2 E.U./dL
pH, UA: 6.5 (ref 5.0–8.0)

## 2022-01-08 NOTE — Progress Notes (Signed)
ROB: Doing well, no major issues. Concerned about growth of her fetus, currently growth is at 1.5%ile based on last week's growth scan by MFM. Recommending weekly BPP/dopplers and growth scans q 3 weeks. Scheduled repeat C-section for h/o C-section x 2, will tentatively schedule for 03/01/2021 .  Unsure of contraceptive desires at this time. Formula and breast feeding. Will submit Rx for [facility] for Boost/Ensure supplements to help with weight gain.  RTC in 2 weeks.

## 2022-01-12 ENCOUNTER — Encounter: Payer: Self-pay | Admitting: *Deleted

## 2022-01-12 ENCOUNTER — Ambulatory Visit: Payer: Medicaid Other | Attending: Maternal & Fetal Medicine

## 2022-01-12 ENCOUNTER — Ambulatory Visit: Payer: Medicaid Other | Admitting: *Deleted

## 2022-01-12 VITALS — BP 117/62 | HR 95

## 2022-01-12 DIAGNOSIS — O0993 Supervision of high risk pregnancy, unspecified, third trimester: Secondary | ICD-10-CM

## 2022-01-12 DIAGNOSIS — O34219 Maternal care for unspecified type scar from previous cesarean delivery: Secondary | ICD-10-CM | POA: Diagnosis not present

## 2022-01-12 DIAGNOSIS — Z3A31 31 weeks gestation of pregnancy: Secondary | ICD-10-CM | POA: Diagnosis not present

## 2022-01-12 DIAGNOSIS — O365930 Maternal care for other known or suspected poor fetal growth, third trimester, not applicable or unspecified: Secondary | ICD-10-CM | POA: Diagnosis not present

## 2022-01-19 ENCOUNTER — Other Ambulatory Visit: Payer: Self-pay | Admitting: *Deleted

## 2022-01-19 ENCOUNTER — Ambulatory Visit: Payer: Medicaid Other | Attending: Maternal & Fetal Medicine

## 2022-01-19 ENCOUNTER — Ambulatory Visit: Payer: Medicaid Other | Admitting: *Deleted

## 2022-01-19 VITALS — BP 114/62 | HR 115

## 2022-01-19 DIAGNOSIS — O365930 Maternal care for other known or suspected poor fetal growth, third trimester, not applicable or unspecified: Secondary | ICD-10-CM

## 2022-01-19 DIAGNOSIS — O34219 Maternal care for unspecified type scar from previous cesarean delivery: Secondary | ICD-10-CM

## 2022-01-19 DIAGNOSIS — O0993 Supervision of high risk pregnancy, unspecified, third trimester: Secondary | ICD-10-CM | POA: Diagnosis present

## 2022-01-19 DIAGNOSIS — O99323 Drug use complicating pregnancy, third trimester: Secondary | ICD-10-CM

## 2022-01-19 DIAGNOSIS — O99513 Diseases of the respiratory system complicating pregnancy, third trimester: Secondary | ICD-10-CM | POA: Diagnosis not present

## 2022-01-19 DIAGNOSIS — Z3A32 32 weeks gestation of pregnancy: Secondary | ICD-10-CM

## 2022-01-19 DIAGNOSIS — J45909 Unspecified asthma, uncomplicated: Secondary | ICD-10-CM

## 2022-01-19 NOTE — Procedures (Signed)
Trust Feltus 1995/06/30 [redacted]w[redacted]d  Fetus A Non-Stress Test Interpretation for 01/19/22  Indication: IUGR  Fetal Heart Rate A Mode: External Baseline Rate (A): 130 bpm Variability: Moderate Accelerations: 15 x 15 Decelerations: None Multiple birth?: No  Uterine Activity Mode: Palpation, Toco Contraction Frequency (min): None  Interpretation (Fetal Testing) Nonstress Test Interpretation: Reactive Comments: Dr. Grace Bushy reviewed tracing.

## 2022-01-25 DIAGNOSIS — Z3A33 33 weeks gestation of pregnancy: Secondary | ICD-10-CM | POA: Insufficient documentation

## 2022-01-25 NOTE — Progress Notes (Deleted)
    NURSE VISIT NOTE  Subjective:    Patient ID: [name], female    DOB: April 23, 1995, 26 y.o.   MRN: [id number]  HPI  Patient is a 26 y.o. [id number] female who presents for fetal monitoring per order from Hildred Laser, MD.   Objective:    LMP 06/05/2021 (Exact Date)  There is no height or weight on file to calculate BMI. Estimated Date of Delivery: 03/12/22  Assessment:   No diagnosis found.   Plan:   Results reviewed and discussed with patient by  Hildred Laser, MD.     Rocco Serene, LPN

## 2022-01-26 ENCOUNTER — Encounter: Payer: Medicaid Other | Admitting: Obstetrics and Gynecology

## 2022-01-26 ENCOUNTER — Other Ambulatory Visit: Payer: Medicaid Other

## 2022-01-26 DIAGNOSIS — O0993 Supervision of high risk pregnancy, unspecified, third trimester: Secondary | ICD-10-CM

## 2022-01-26 DIAGNOSIS — O365930 Maternal care for other known or suspected poor fetal growth, third trimester, not applicable or unspecified: Secondary | ICD-10-CM

## 2022-01-26 DIAGNOSIS — Z3A33 33 weeks gestation of pregnancy: Secondary | ICD-10-CM

## 2022-01-27 ENCOUNTER — Ambulatory Visit (HOSPITAL_BASED_OUTPATIENT_CLINIC_OR_DEPARTMENT_OTHER): Payer: Medicaid Other | Admitting: *Deleted

## 2022-01-27 ENCOUNTER — Ambulatory Visit: Payer: Medicaid Other | Admitting: *Deleted

## 2022-01-27 ENCOUNTER — Ambulatory Visit: Payer: Medicaid Other | Attending: Maternal & Fetal Medicine

## 2022-01-27 ENCOUNTER — Other Ambulatory Visit: Payer: Self-pay | Admitting: *Deleted

## 2022-01-27 ENCOUNTER — Ambulatory Visit: Payer: Medicaid Other

## 2022-01-27 VITALS — BP 122/64 | HR 104

## 2022-01-27 DIAGNOSIS — O365931 Maternal care for other known or suspected poor fetal growth, third trimester, fetus 1: Secondary | ICD-10-CM | POA: Insufficient documentation

## 2022-01-27 DIAGNOSIS — O34219 Maternal care for unspecified type scar from previous cesarean delivery: Secondary | ICD-10-CM | POA: Diagnosis not present

## 2022-01-27 DIAGNOSIS — O99513 Diseases of the respiratory system complicating pregnancy, third trimester: Secondary | ICD-10-CM

## 2022-01-27 DIAGNOSIS — Z3A33 33 weeks gestation of pregnancy: Secondary | ICD-10-CM | POA: Diagnosis not present

## 2022-01-27 DIAGNOSIS — F1910 Other psychoactive substance abuse, uncomplicated: Secondary | ICD-10-CM | POA: Diagnosis not present

## 2022-01-27 DIAGNOSIS — O0993 Supervision of high risk pregnancy, unspecified, third trimester: Secondary | ICD-10-CM

## 2022-01-27 DIAGNOSIS — J45909 Unspecified asthma, uncomplicated: Secondary | ICD-10-CM

## 2022-01-27 DIAGNOSIS — O365930 Maternal care for other known or suspected poor fetal growth, third trimester, not applicable or unspecified: Secondary | ICD-10-CM | POA: Diagnosis not present

## 2022-01-27 DIAGNOSIS — O99323 Drug use complicating pregnancy, third trimester: Secondary | ICD-10-CM | POA: Diagnosis not present

## 2022-01-27 NOTE — Procedures (Signed)
[name] Feb 04, 1996 [redacted]w[redacted]d  Fetus A Non-Stress Test Interpretation for 01/27/22  Indication: IUGR  Fetal Heart Rate A Mode: External Baseline Rate (A): 135 bpm Variability: Moderate Accelerations: 15 x 15 Decelerations: None Multiple birth?: No  Uterine Activity Mode: Palpation, Toco Contraction Frequency (min): none Resting Tone Palpated: Relaxed  Interpretation (Fetal Testing) Nonstress Test Interpretation: Reactive Overall Impression: Reassuring for gestational age Comments: Dr. Grace Bushy reviewed tracing

## 2022-02-01 ENCOUNTER — Other Ambulatory Visit: Payer: Self-pay | Admitting: *Deleted

## 2022-02-01 ENCOUNTER — Other Ambulatory Visit: Payer: Self-pay | Admitting: Maternal & Fetal Medicine

## 2022-02-01 ENCOUNTER — Ambulatory Visit: Payer: Medicaid Other | Admitting: *Deleted

## 2022-02-01 ENCOUNTER — Ambulatory Visit: Payer: Medicaid Other | Attending: Maternal & Fetal Medicine

## 2022-02-01 VITALS — BP 139/72 | HR 105

## 2022-02-01 DIAGNOSIS — O34219 Maternal care for unspecified type scar from previous cesarean delivery: Secondary | ICD-10-CM

## 2022-02-01 DIAGNOSIS — O365930 Maternal care for other known or suspected poor fetal growth, third trimester, not applicable or unspecified: Secondary | ICD-10-CM | POA: Diagnosis not present

## 2022-02-01 DIAGNOSIS — O365931 Maternal care for other known or suspected poor fetal growth, third trimester, fetus 1: Secondary | ICD-10-CM | POA: Insufficient documentation

## 2022-02-01 DIAGNOSIS — Z3A34 34 weeks gestation of pregnancy: Secondary | ICD-10-CM | POA: Diagnosis present

## 2022-02-01 DIAGNOSIS — O99323 Drug use complicating pregnancy, third trimester: Secondary | ICD-10-CM | POA: Diagnosis not present

## 2022-02-01 DIAGNOSIS — O0993 Supervision of high risk pregnancy, unspecified, third trimester: Secondary | ICD-10-CM | POA: Insufficient documentation

## 2022-02-01 DIAGNOSIS — J45909 Unspecified asthma, uncomplicated: Secondary | ICD-10-CM

## 2022-02-01 DIAGNOSIS — O99513 Diseases of the respiratory system complicating pregnancy, third trimester: Secondary | ICD-10-CM | POA: Diagnosis not present

## 2022-02-01 NOTE — Procedures (Signed)
[name] 18-Mar-1995 110w3d  Fetus A Non-Stress Test Interpretation for 02/01/22  Indication: IUGR  Fetal Heart Rate A Mode: External Baseline Rate (A): 135 bpm Variability: Moderate Accelerations: 15 x 15 Decelerations: None Multiple birth?: No  Uterine Activity Mode: Palpation, Toco Contraction Frequency (min): None  Interpretation (Fetal Testing) Nonstress Test Interpretation: Reactive Comments: Dr. Parke Poisson reviewed tracing.

## 2022-02-05 ENCOUNTER — Observation Stay
Admission: EM | Admit: 2022-02-05 | Discharge: 2022-02-05 | Disposition: A | Discharge status: Home / Self Care | Payer: Medicaid Other | Source: Ambulatory Visit | Attending: Obstetrics | Admitting: Obstetrics

## 2022-02-05 ENCOUNTER — Encounter: Payer: Self-pay | Admitting: Obstetrics and Gynecology

## 2022-02-05 ENCOUNTER — Observation Stay: Payer: Medicaid Other

## 2022-02-05 ENCOUNTER — Other Ambulatory Visit: Payer: Self-pay

## 2022-02-05 DIAGNOSIS — O368130 Decreased fetal movements, third trimester, not applicable or unspecified: Secondary | ICD-10-CM | POA: Diagnosis not present

## 2022-02-05 DIAGNOSIS — M549 Dorsalgia, unspecified: Secondary | ICD-10-CM | POA: Diagnosis not present

## 2022-02-05 DIAGNOSIS — O99891 Other specified diseases and conditions complicating pregnancy: Secondary | ICD-10-CM | POA: Diagnosis present

## 2022-02-05 DIAGNOSIS — Z3A35 35 weeks gestation of pregnancy: Secondary | ICD-10-CM | POA: Diagnosis not present

## 2022-02-05 DIAGNOSIS — O368131 Decreased fetal movements, third trimester, fetus 1: Secondary | ICD-10-CM

## 2022-02-05 DIAGNOSIS — O365930 Maternal care for other known or suspected poor fetal growth, third trimester, not applicable or unspecified: Secondary | ICD-10-CM

## 2022-02-05 DIAGNOSIS — O0993 Supervision of high risk pregnancy, unspecified, third trimester: Secondary | ICD-10-CM

## 2022-02-05 DIAGNOSIS — O288 Other abnormal findings on antenatal screening of mother: Secondary | ICD-10-CM

## 2022-02-05 LAB — URINALYSIS, COMPLETE (UACMP) WITH MICROSCOPIC
Bilirubin Urine: NEGATIVE
Glucose, UA: NEGATIVE mg/dL
Hgb urine dipstick: NEGATIVE
Ketones, ur: NEGATIVE mg/dL
Nitrite: NEGATIVE
Protein, ur: NEGATIVE mg/dL
Specific Gravity, Urine: 1.024 (ref 1.005–1.030)
pH: 5 (ref 5.0–8.0)

## 2022-02-05 MED ORDER — ACETAMINOPHEN 325 MG PO TABS
650.0000 mg | ORAL_TABLET | ORAL | Status: DC | PRN
  Administered 2022-02-05: 650 mg via ORAL
  Filled 2022-02-05: qty 2

## 2022-02-05 MED ORDER — LACTATED RINGERS IV BOLUS
500.0000 mL | Freq: Once | INTRAVENOUS | Status: AC
  Administered 2022-02-05: 500 mL via INTRAVENOUS

## 2022-02-05 MED ORDER — LACTATED RINGERS IV SOLN: INTRAVENOUS | Status: DC

## 2022-02-05 MED ORDER — CYCLOBENZAPRINE HCL 10 MG PO TABS: 10.0000 mg | ORAL_TABLET | Freq: Three times a day (TID) | ORAL | 1 refills | Status: DC | PRN

## 2022-02-05 NOTE — Progress Notes (Signed)
Discharged home. Left floor by self. [name]

## 2022-02-05 NOTE — OB Triage Note (Signed)
Pt here co lower back pain starting yesterday morning. Denies ctx. Denies LOF, vaginal bleeding. Also reporting decreased fetal movement. Rating back pain 6 out of 10. Has not taken anything for back pain. Denies eating or drinking anything this am. Elaina Hoops

## 2022-02-05 NOTE — OB Triage Note (Signed)
LABOR & DELIVERY OB TRIAGE NOTE  SUBJECTIVE  HPI [name] is a 26 y.o. [id number] at [redacted]w[redacted]d who presents to Labor & Delivery for back pain and decreased fetal movement. Her pregnancy is complicated by severe FGR with EFW < 1st %ile and h/o of CS x 2. She reports that yesterday morning, she woke up with back pain so severe that she did not want to get out of bed. She describes it as aching and throbbing, "like if you fell on your back." The pain has been continuous with just a few breaks. She denies any trauma, heavy lifting, or other clear precipitating event. She has not tried Tylenol or heating pad, but she states she did get relief while she was in the tub. She has been feeling increased pelvic pressure but no contractions. She denies LOF and vaginal bleeding. She has only felt the baby move ~5 times since yesterday morning. She has not eaten anything today. She has had a poor appetite this entire pregnancy. She has weekly BPPs with Dopplers. BPP 02/01/22 was 10/10.  OB History     Gravida  5   Para  2   Term  2   Preterm      AB  2   Living  2      SAB  2   IAB      Ectopic      Multiple  0   Live Births  2           Scheduled Meds: Continuous Infusions:  lactated ringers     Followed by   lactated ringers     PRN Meds:.acetaminophen  OBJECTIVE  Ht 4\' 11"  (1.499 m)   Wt 60.3 kg   LMP 06/05/2021 (Exact Date)   BMI 26.86 kg/m   General: alert, cooperative, no acute distress Heart: RRR Lungs: CTAB Abdomen: soft, gravid, tender to palpation in suprapubic area  NST I reviewed the NST and it was reactive.  Baseline: 125 Variability: minimal initially, now moderate Accelerations: present Decelerations:none Toco: irritability, rare contraction Category 1/2  ASSESSMENT Impression  1) Pregnancy at 08/05/2021, [redacted]w[redacted]d, Estimated Date of Delivery: 03/12/22 2) Decreased fetal movement 3) Back pain - likely musculoskeletal  PLAN  1) IV hydration 2)  Tylenol and heating pad for back pain 3) BPP  05/11/22, CNM 02/05/22 10:56 AM

## 2022-02-05 NOTE — OB Triage Note (Signed)
LABOR & DELIVERY OB TRIAGE NOTE  SUBJECTIVE  HPI [name] is a 26 y.o. [id number] at [redacted]w[redacted]d who presents to Labor & Delivery for back pain and decreased fetal movement. Her pain improved slightly with Tylenol. She declines Flexeril because she needs to drive and thinks it will make her sleepy. She has felt some fetal movements since being in triage. BPP was 8/8 and NST in triage has been reactive. She has felt some abdominal tightening but no regular, painful contractions.   OB History     Gravida  5   Para  2   Term  2   Preterm      AB  2   Living  2      SAB  2   IAB      Ectopic      Multiple  0   Live Births  2           Scheduled Meds: Continuous Infusions:  lactated ringers 125 mL/hr at 02/05/22 1100   PRN Meds:.acetaminophen  OBJECTIVE  Ht 4\' 11"  (1.499 m)   Wt 60.3 kg   LMP 06/05/2021 (Exact Date)   BMI 26.86 kg/m   NST I reviewed the NST and it was reactive.  Baseline: 125 Variability: moderate Accelerations: present Decelerations:none Toco: irritability Category 1  BPP 8/8  ASSESSMENT Impression  1) Pregnancy at 08/05/2021, [redacted]w[redacted]d, Estimated Date of Delivery: 03/12/22 2) Reassuring maternal/fetal status 3) Appropriate fetal movement 4) Musculoskeletal pain  PLAN  1) Discharge home with return/labor precautions 2) Encouraged adequate hydration, nutrition, and rest 3) Tylenol, heating pad, warm bath, Flexeril for back pain 4) Cesarean birth recommended at 37 weeks per MFM. Discussed timing of birth with Dr. 05/11/22. Rescheduled for 02/24/22 at noon.  02/26/22, CNM 02/05/22  1:09 PM

## 2022-02-07 LAB — URINE CULTURE: Culture: NO GROWTH

## 2022-02-09 ENCOUNTER — Ambulatory Visit: Payer: Medicaid Other | Admitting: *Deleted

## 2022-02-09 ENCOUNTER — Ambulatory Visit: Payer: Medicaid Other

## 2022-02-09 ENCOUNTER — Ambulatory Visit: Payer: Medicaid Other | Attending: Maternal & Fetal Medicine

## 2022-02-09 VITALS — BP 133/78 | HR 108

## 2022-02-09 DIAGNOSIS — O365930 Maternal care for other known or suspected poor fetal growth, third trimester, not applicable or unspecified: Secondary | ICD-10-CM | POA: Insufficient documentation

## 2022-02-09 DIAGNOSIS — O34219 Maternal care for unspecified type scar from previous cesarean delivery: Secondary | ICD-10-CM | POA: Diagnosis not present

## 2022-02-09 DIAGNOSIS — O99323 Drug use complicating pregnancy, third trimester: Secondary | ICD-10-CM | POA: Diagnosis not present

## 2022-02-09 DIAGNOSIS — O99513 Diseases of the respiratory system complicating pregnancy, third trimester: Secondary | ICD-10-CM

## 2022-02-09 DIAGNOSIS — J45909 Unspecified asthma, uncomplicated: Secondary | ICD-10-CM

## 2022-02-09 DIAGNOSIS — O0993 Supervision of high risk pregnancy, unspecified, third trimester: Secondary | ICD-10-CM | POA: Diagnosis present

## 2022-02-09 DIAGNOSIS — F1290 Cannabis use, unspecified, uncomplicated: Secondary | ICD-10-CM | POA: Diagnosis not present

## 2022-02-09 DIAGNOSIS — Z3A35 35 weeks gestation of pregnancy: Secondary | ICD-10-CM

## 2022-02-10 ENCOUNTER — Other Ambulatory Visit: Payer: Medicaid Other

## 2022-02-10 ENCOUNTER — Other Ambulatory Visit: Payer: Self-pay | Admitting: Maternal & Fetal Medicine

## 2022-02-10 ENCOUNTER — Encounter: Payer: Self-pay | Admitting: Obstetrics and Gynecology

## 2022-02-10 ENCOUNTER — Ambulatory Visit (INDEPENDENT_AMBULATORY_CARE_PROVIDER_SITE_OTHER): Payer: Medicaid Other | Admitting: Obstetrics and Gynecology

## 2022-02-10 ENCOUNTER — Other Ambulatory Visit (HOSPITAL_COMMUNITY)
Admission: RE | Admit: 2022-02-10 | Discharge: 2022-02-10 | Disposition: A | Discharge status: Home / Self Care | Payer: Medicaid Other | Source: Ambulatory Visit | Attending: Obstetrics and Gynecology | Admitting: Obstetrics and Gynecology

## 2022-02-10 VITALS — BP 114/69 | HR 90 | Wt 133.1 lb

## 2022-02-10 DIAGNOSIS — Z01812 Encounter for preprocedural laboratory examination: Secondary | ICD-10-CM

## 2022-02-10 DIAGNOSIS — O0993 Supervision of high risk pregnancy, unspecified, third trimester: Secondary | ICD-10-CM

## 2022-02-10 DIAGNOSIS — O365930 Maternal care for other known or suspected poor fetal growth, third trimester, not applicable or unspecified: Secondary | ICD-10-CM

## 2022-02-10 DIAGNOSIS — Z113 Encounter for screening for infections with a predominantly sexual mode of transmission: Secondary | ICD-10-CM | POA: Insufficient documentation

## 2022-02-10 DIAGNOSIS — O34219 Maternal care for unspecified type scar from previous cesarean delivery: Secondary | ICD-10-CM

## 2022-02-10 DIAGNOSIS — Z3A35 35 weeks gestation of pregnancy: Secondary | ICD-10-CM

## 2022-02-10 DIAGNOSIS — Z23 Encounter for immunization: Secondary | ICD-10-CM

## 2022-02-10 DIAGNOSIS — Z3685 Encounter for antenatal screening for Streptococcus B: Secondary | ICD-10-CM

## 2022-02-10 LAB — POCT URINALYSIS DIPSTICK OB
Bilirubin, UA: NEGATIVE
Glucose, UA: NEGATIVE
Ketones, UA: NEGATIVE
Leukocytes, UA: NEGATIVE
Nitrite, UA: NEGATIVE
Spec Grav, UA: 1.02 (ref 1.010–1.025)
Urobilinogen, UA: 0.2 E.U./dL
pH, UA: 7 (ref 5.0–8.0)

## 2022-02-10 NOTE — Progress Notes (Signed)
ROB 35.5w: She is doing well. She has no new concerns. Good fetal movement. TDAP and BTC form signed today.

## 2022-02-10 NOTE — Progress Notes (Signed)
ROB: [name] is a 26 y.o. [phone] female at [redacted]w[redacted]d. Doing well, no complaints. Seen in triage over the weekend due to concerns for back pain and contractions. Still noting some back pain. Was prescribed Flexeril for back pain but notes she has not taken.  Advised on increasing water intake (as patient notes she drinks mostly sodas, to decrease risk of kidney stone), and use of heating pads. Ruled out for UTI in triage. Cultures done today. Patient's C-section has been moved from 03/02/21 to 02/24/22 due to significant growth restriction.  Continue antenatal testing with MFM. RTC in 1 week.

## 2022-02-11 ENCOUNTER — Encounter: Payer: Self-pay | Admitting: Obstetrics and Gynecology

## 2022-02-11 ENCOUNTER — Other Ambulatory Visit: Payer: Self-pay

## 2022-02-11 DIAGNOSIS — Z98891 History of uterine scar from previous surgery: Secondary | ICD-10-CM

## 2022-02-11 DIAGNOSIS — O365930 Maternal care for other known or suspected poor fetal growth, third trimester, not applicable or unspecified: Secondary | ICD-10-CM

## 2022-02-11 LAB — CERVICOVAGINAL ANCILLARY ONLY
Chlamydia: NEGATIVE
Comment: NEGATIVE
Comment: NORMAL
Neisseria Gonorrhea: NEGATIVE

## 2022-02-12 LAB — STREP GP B NAA: Strep Gp B NAA: NEGATIVE

## 2022-02-17 ENCOUNTER — Ambulatory Visit: Payer: Medicaid Other

## 2022-02-17 ENCOUNTER — Encounter: Payer: Self-pay | Admitting: Obstetrics and Gynecology

## 2022-02-17 ENCOUNTER — Ambulatory Visit (INDEPENDENT_AMBULATORY_CARE_PROVIDER_SITE_OTHER): Payer: Medicaid Other | Admitting: Obstetrics and Gynecology

## 2022-02-17 VITALS — BP 114/67 | HR 92 | Wt 137.0 lb

## 2022-02-17 VITALS — BP 114/67 | HR 92 | Ht 59.0 in | Wt 137.0 lb

## 2022-02-17 DIAGNOSIS — O365930 Maternal care for other known or suspected poor fetal growth, third trimester, not applicable or unspecified: Secondary | ICD-10-CM

## 2022-02-17 DIAGNOSIS — O0993 Supervision of high risk pregnancy, unspecified, third trimester: Secondary | ICD-10-CM

## 2022-02-17 DIAGNOSIS — Z3A36 36 weeks gestation of pregnancy: Secondary | ICD-10-CM | POA: Diagnosis not present

## 2022-02-17 DIAGNOSIS — O34219 Maternal care for unspecified type scar from previous cesarean delivery: Secondary | ICD-10-CM

## 2022-02-17 DIAGNOSIS — Z98891 History of uterine scar from previous surgery: Secondary | ICD-10-CM

## 2022-02-17 LAB — POCT URINALYSIS DIPSTICK OB
Bilirubin, UA: NEGATIVE
Glucose, UA: NEGATIVE
Ketones, UA: NEGATIVE
Leukocytes, UA: NEGATIVE
Nitrite, UA: NEGATIVE
Spec Grav, UA: 1.025 (ref 1.010–1.025)
Urobilinogen, UA: 0.2 E.U./dL
pH, UA: 6.5 (ref 5.0–8.0)

## 2022-02-17 NOTE — Patient Instructions (Addendum)
Fetal Movement Counts Patient Name: ________________________________________________ Patient Due Date: ____________________ What is a fetal movement count?  A fetal movement count is the number of times that you feel your baby move during a certain amount of time. This may also be called a fetal kick count. A fetal movement count is recommended for every pregnant woman. You may be asked to start counting fetal movements as early as week 28 of your pregnancy. Pay attention to when your baby is most active. You may notice your baby's sleep and wake cycles. You may also notice things that make your baby move more. You should do a fetal movement count: When your baby is normally most active. At the same time each day. A good time to count movements is while you are resting, after having something to eat and drink. How do I count fetal movements? Find a quiet, comfortable area. Sit, or lie down on your side. Write down the date, the start time and stop time, and the number of movements that you felt between those two times. Take this information with you to your health care visits. Write down your start time when you feel the first movement. Count kicks, flutters, swishes, rolls, and jabs. You should feel at least 10 movements. You may stop counting after you have felt 10 movements, or if you have been counting for 2 hours. Write down the stop time. If you do not feel 10 movements in 2 hours, contact your health care provider for further instructions. Your health care provider may want to do additional tests to assess your baby's well-being. Contact a health care provider if: You feel fewer than 10 movements in 2 hours. Your baby is not moving like he or she usually does. Date: ____________ Start time: ____________ Stop time: ____________ Movements: ____________ Date: ____________ Start time: ____________ Stop time: ____________ Movements: ____________ Date: ____________ Start time: ____________ Stop  time: ____________ Movements: ____________ Date: ____________ Start time: ____________ Stop time: ____________ Movements: ____________ Date: ____________ Start time: ____________ Stop time: ____________ Movements: ____________ Date: ____________ Start time: ____________ Stop time: ____________ Movements: ____________ Date: ____________ Start time: ____________ Stop time: ____________ Movements: ____________ Date: ____________ Start time: ____________ Stop time: ____________ Movements: ____________ Date: ____________ Start time: ____________ Stop time: ____________ Movements: ____________ This information is not intended to replace advice given to you by your health care provider. Make sure you discuss any questions you have with your health care provider. Document Revised: 10/05/2018 Document Reviewed: 10/05/2018 Elsevier Patient Education  2023 [employer]  

## 2022-02-17 NOTE — Progress Notes (Signed)
ROB [redacted]w[redacted]d: She is doing well today. She has good fetal movement. Has no new concerns.

## 2022-02-17 NOTE — Progress Notes (Signed)
ROB: Patient is a 26 y.o. F1T0211 at [redacted]w[redacted]d. Pregnancy complicated by severe IUGR (< 1%ile).  Scheduled for repeat C-section next week. Has no questions regarding surgery.  Does note she has not yet heard anything regarding her pre-admit appointment yet. Will reach out to hospital.  Patient scheduled for MFM visit and ultrasound tomorrow. Audible decel heard on Dopplers, NST performed today, with 2 shallow variables noted at beginning of tracing but otherwise reassuring.  Also very active fetal movement during NST. Advised on fetal kick counts, f/u with MFM tomorrow.    NONSTRESS TEST INTERPRETATION  INDICATIONS:  Audible fetal deceleration on Doppler  and  IUGR  FHR baseline: 130 bpm Accelerations: present Decelerations: 2 shallow variables within 3 minutes apart at beginning of NST.  Variability: moderate Tocometry: No contractions  RESULTS:Reactive    PLAN: 1. Continue fetal kick counts twice a day. 2. Continue antepartum testing as scheduled.  Has appt tomorrow.  C-section scheduled for 12/27.

## 2022-02-17 NOTE — Progress Notes (Signed)
    NURSE VISIT NOTE  Subjective:    Patient ID: Antony Odea Cozart, female    DOB: [birth year], 26 y.o.   MRN: [id number]  HPI  Patient is a 26 y.o. [phone] female who presents for fetal monitoring per order from Hildred Laser, MD.   Objective:    BP 114/67   Pulse 92   Ht 4\' 11"  (1.499 m)   Wt 137 lb (62.1 kg)   LMP 06/05/2021 (Exact Date)   BMI 27.67 kg/m  Estimated Date of Delivery: 03/12/22  Assessment:   1. Supervision of high risk pregnancy in third trimester   2. [redacted] weeks gestation of pregnancy   3. Poor fetal growth affecting management of mother in third trimester, single or unspecified fetus      Plan:   Results reviewed and discussed with patient by  05/11/22, MD.     Hildred Laser, LPN

## 2022-02-18 ENCOUNTER — Ambulatory Visit (HOSPITAL_BASED_OUTPATIENT_CLINIC_OR_DEPARTMENT_OTHER): Payer: Medicaid Other

## 2022-02-18 ENCOUNTER — Other Ambulatory Visit: Payer: Self-pay

## 2022-02-18 ENCOUNTER — Encounter
Admission: RE | Admit: 2022-02-18 | Discharge: 2022-02-18 | Disposition: A | Discharge status: Home / Self Care | Payer: Medicaid Other | Source: Ambulatory Visit | Attending: Obstetrics and Gynecology | Admitting: Obstetrics and Gynecology

## 2022-02-18 DIAGNOSIS — Z3A34 34 weeks gestation of pregnancy: Secondary | ICD-10-CM

## 2022-02-18 DIAGNOSIS — Z87891 Personal history of nicotine dependence: Secondary | ICD-10-CM

## 2022-02-18 DIAGNOSIS — Z3A36 36 weeks gestation of pregnancy: Secondary | ICD-10-CM | POA: Insufficient documentation

## 2022-02-18 DIAGNOSIS — O365930 Maternal care for other known or suspected poor fetal growth, third trimester, not applicable or unspecified: Secondary | ICD-10-CM | POA: Diagnosis present

## 2022-02-18 DIAGNOSIS — O99513 Diseases of the respiratory system complicating pregnancy, third trimester: Secondary | ICD-10-CM | POA: Diagnosis not present

## 2022-02-18 DIAGNOSIS — J45909 Unspecified asthma, uncomplicated: Secondary | ICD-10-CM | POA: Diagnosis not present

## 2022-02-18 DIAGNOSIS — O34219 Maternal care for unspecified type scar from previous cesarean delivery: Secondary | ICD-10-CM | POA: Diagnosis not present

## 2022-02-18 DIAGNOSIS — O99891 Other specified diseases and conditions complicating pregnancy: Secondary | ICD-10-CM

## 2022-02-18 DIAGNOSIS — O99323 Drug use complicating pregnancy, third trimester: Secondary | ICD-10-CM | POA: Insufficient documentation

## 2022-02-18 DIAGNOSIS — O365931 Maternal care for other known or suspected poor fetal growth, third trimester, fetus 1: Secondary | ICD-10-CM

## 2022-02-18 HISTORY — DX: Anxiety disorder, unspecified: F41.9

## 2022-02-18 HISTORY — DX: Depression, unspecified: F32.A

## 2022-02-18 NOTE — Patient Instructions (Addendum)
Your procedure is scheduled on: 02/24/22 - Wednesday, arrival time : 10:00 am Report to the Registration Desk on the 1st floor of [facility].   REMEMBER: Instructions that are not followed completely may result in serious medical risk, up to and including death; or upon the discretion of your surgeon and anesthesiologist your surgery may need to be rescheduled.  Do not eat food or drink liquids after midnight the night before surgery.  No gum chewing, lozengers or hard candies.   TAKE THESE MEDICATIONS THE MORNING OF SURGERY WITH A SIP OF WATER: - Prenatal Multivit-Min-FA ([employer] PRENATAL GUMMY) 0.4 MG CHEW   One week prior to surgery: Stop Anti-inflammatories (NSAIDS) such as Advil, Aleve, Ibuprofen, Motrin, Naproxen, Naprosyn and Aspirin based products such as Excedrin, Goodys Powder, BC Powder.  Stop ANY OVER THE COUNTER supplements until after surgery.  You may take Tylenol if needed for pain up until the day of surgery.  No Alcohol for 24 hours before or after surgery.  No Smoking including e-cigarettes for 24 hours prior to surgery.  No chewable tobacco products for at least 6 hours prior to surgery.  No nicotine patches on the day of surgery.  Do not use any "recreational" drugs for at least a week prior to your surgery.  Please be advised that the combination of cocaine and anesthesia may have negative outcomes, up to and including death. If you test positive for cocaine, your surgery will be cancelled.  On the morning of surgery brush your teeth with toothpaste and water, you may rinse your mouth with mouthwash if you wish. Do not swallow any toothpaste or mouthwash.  Use CHG Soap or wipes as directed on instruction sheet.  Do not wear jewelry, make-up, hairpins, clips or nail polish.  Do not wear lotions, powders, or perfumes.   Do not shave body from the neck down 48 hours prior to surgery just in case you cut yourself which could leave a site for infection.   Also, freshly shaved skin may become irritated if using the CHG soap.  Contact lenses, hearing aids and dentures may not be worn into surgery.  Do not bring valuables to the hospital. [facility] is not responsible for any missing/lost belongings or valuables.   Notify your doctor if there is any change in your medical condition (cold, fever, infection).  Wear comfortable clothing (specific to your surgery type) to the hospital.  After surgery, you can help prevent lung complications by doing breathing exercises.  Take deep breaths and cough every 1-2 hours. Your doctor may order a device called an Incentive Spirometer to help you take deep breaths. When coughing or sneezing, hold a pillow firmly against your incision with both hands. This is called "splinting." Doing this helps protect your incision. It also decreases belly discomfort.  If you are being admitted to the hospital overnight, leave your suitcase in the car. After surgery it may be brought to your room.  If you are being discharged the day of surgery, you will not be allowed to drive home. You will need a responsible adult (18 years or older) to drive you home and stay with you that night.   If you are taking public transportation, you will need to have a responsible adult (18 years or older) with you. Please confirm with your physician that it is acceptable to use public transportation.   Please call the Pre-admissions Testing Dept. at [phone] if you have any questions about these instructions.  Surgery  Visitation Policy:  Patients undergoing a surgery or procedure may have two family members or support persons with them as long as the person is not COVID-19 positive or experiencing its symptoms.   Inpatient Visitation:    Visiting hours are 7 a.m. to 8 p.m. Up to four visitors are allowed at one time in a patient room. The visitors may rotate out with other people during the day. One designated support person  (adult) may remain overnight.  Due to an increase in RSV and influenza rates and associated hospitalizations, children ages 75 and under will not be able to visit patients in [facility]. Masks continue to be strongly recommended.

## 2022-02-19 ENCOUNTER — Encounter: Payer: Self-pay | Admitting: Obstetrics and Gynecology

## 2022-02-23 ENCOUNTER — Encounter
Admission: RE | Admit: 2022-02-23 | Discharge: 2022-02-23 | Disposition: A | Discharge status: Home / Self Care | Payer: Medicaid Other | Source: Ambulatory Visit | Attending: Obstetrics and Gynecology | Admitting: Obstetrics and Gynecology

## 2022-02-23 DIAGNOSIS — Z01812 Encounter for preprocedural laboratory examination: Secondary | ICD-10-CM | POA: Insufficient documentation

## 2022-02-23 LAB — TYPE AND SCREEN
ABO/RH(D): O POS
Antibody Screen: NEGATIVE
Extend sample reason: UNDETERMINED

## 2022-02-23 LAB — CBC
HCT: 35.8 % — ABNORMAL LOW (ref 36.0–46.0)
Hemoglobin: 11.6 g/dL — ABNORMAL LOW (ref 12.0–15.0)
MCH: 28.9 pg (ref 26.0–34.0)
MCHC: 32.4 g/dL (ref 30.0–36.0)
MCV: 89.3 fL (ref 80.0–100.0)
Platelets: 258 10*3/uL (ref 150–400)
RBC: 4.01 MIL/uL (ref 3.87–5.11)
RDW: 12.6 % (ref 11.5–15.5)
WBC: 8.6 10*3/uL (ref 4.0–10.5)
nRBC: 0 % (ref 0.0–0.2)

## 2022-02-24 ENCOUNTER — Encounter
Admission: RE | Disposition: A | Discharge status: Home / Self Care | Payer: Self-pay | Source: Home / Self Care | Attending: Obstetrics and Gynecology

## 2022-02-24 ENCOUNTER — Inpatient Hospital Stay: Payer: Medicaid Other | Admitting: Urgent Care

## 2022-02-24 ENCOUNTER — Other Ambulatory Visit: Payer: Self-pay

## 2022-02-24 ENCOUNTER — Inpatient Hospital Stay
Admission: RE | Admit: 2022-02-24 | Discharge: 2022-02-26 | DRG: 787 | Disposition: A | Discharge status: Home / Self Care | Payer: Medicaid Other | Attending: Obstetrics and Gynecology | Admitting: Obstetrics and Gynecology

## 2022-02-24 ENCOUNTER — Other Ambulatory Visit: Payer: Medicaid Other

## 2022-02-24 ENCOUNTER — Encounter: Payer: Self-pay | Admitting: Obstetrics and Gynecology

## 2022-02-24 DIAGNOSIS — O0990 Supervision of high risk pregnancy, unspecified, unspecified trimester: Secondary | ICD-10-CM

## 2022-02-24 DIAGNOSIS — Z3A37 37 weeks gestation of pregnancy: Secondary | ICD-10-CM

## 2022-02-24 DIAGNOSIS — Z87891 Personal history of nicotine dependence: Secondary | ICD-10-CM | POA: Diagnosis not present

## 2022-02-24 DIAGNOSIS — O365930 Maternal care for other known or suspected poor fetal growth, third trimester, not applicable or unspecified: Secondary | ICD-10-CM | POA: Diagnosis present

## 2022-02-24 DIAGNOSIS — Z98891 History of uterine scar from previous surgery: Secondary | ICD-10-CM

## 2022-02-24 DIAGNOSIS — O99324 Drug use complicating childbirth: Secondary | ICD-10-CM | POA: Diagnosis present

## 2022-02-24 DIAGNOSIS — F1290 Cannabis use, unspecified, uncomplicated: Secondary | ICD-10-CM | POA: Diagnosis present

## 2022-02-24 DIAGNOSIS — O0993 Supervision of high risk pregnancy, unspecified, third trimester: Principal | ICD-10-CM

## 2022-02-24 DIAGNOSIS — O99320 Drug use complicating pregnancy, unspecified trimester: Secondary | ICD-10-CM | POA: Diagnosis present

## 2022-02-24 DIAGNOSIS — O34211 Maternal care for low transverse scar from previous cesarean delivery: Secondary | ICD-10-CM | POA: Diagnosis present

## 2022-02-24 DIAGNOSIS — O365990 Maternal care for other known or suspected poor fetal growth, unspecified trimester, not applicable or unspecified: Secondary | ICD-10-CM | POA: Diagnosis present

## 2022-02-24 LAB — URINE DRUG SCREEN, QUALITATIVE (ARMC ONLY)
Amphetamines, Ur Screen: NOT DETECTED
Barbiturates, Ur Screen: NOT DETECTED
Benzodiazepine, Ur Scrn: NOT DETECTED
Cannabinoid 50 Ng, Ur ~~LOC~~: POSITIVE — AB
Cocaine Metabolite,Ur ~~LOC~~: NOT DETECTED
MDMA (Ecstasy)Ur Screen: NOT DETECTED
Methadone Scn, Ur: NOT DETECTED
Opiate, Ur Screen: NOT DETECTED
Phencyclidine (PCP) Ur S: NOT DETECTED
Tricyclic, Ur Screen: NOT DETECTED

## 2022-02-24 LAB — RPR: RPR Ser Ql: NONREACTIVE

## 2022-02-24 SURGERY — Surgical Case
Anesthesia: Spinal

## 2022-02-24 MED ORDER — FENTANYL CITRATE (PF) 100 MCG/2ML IJ SOLN
INTRAMUSCULAR | Status: DC | PRN
  Administered 2022-02-24: 15 ug via INTRATHECAL

## 2022-02-24 MED ORDER — ONDANSETRON HCL 4 MG/2ML IJ SOLN
INTRAMUSCULAR | Status: AC
  Filled 2022-02-24: qty 2

## 2022-02-24 MED ORDER — SOD CITRATE-CITRIC ACID 500-334 MG/5ML PO SOLN
30.0000 mL | ORAL | Status: AC
  Administered 2022-02-24: 30 mL via ORAL

## 2022-02-24 MED ORDER — ACETAMINOPHEN 500 MG PO TABS
1000.0000 mg | ORAL_TABLET | ORAL | Status: AC
  Administered 2022-02-24: 1000 mg via ORAL
  Filled 2022-02-24: qty 2

## 2022-02-24 MED ORDER — SODIUM CHLORIDE (PF) 0.9 % IJ SOLN
INTRAMUSCULAR | Status: DC | PRN
  Administered 2022-02-24: 50 mL via INTRAVENOUS

## 2022-02-24 MED ORDER — DIBUCAINE (PERIANAL) 1 % EX OINT: 1.0000 | TOPICAL_OINTMENT | CUTANEOUS | Status: DC | PRN

## 2022-02-24 MED ORDER — DIPHENHYDRAMINE HCL 25 MG PO CAPS: 25.0000 mg | ORAL_CAPSULE | Freq: Four times a day (QID) | ORAL | Status: DC | PRN

## 2022-02-24 MED ORDER — KETOROLAC TROMETHAMINE 30 MG/ML IJ SOLN
30.0000 mg | Freq: Four times a day (QID) | INTRAMUSCULAR | Status: AC
  Administered 2022-02-24 – 2022-02-25 (×4): 30 mg via INTRAVENOUS
  Filled 2022-02-24 (×4): qty 1

## 2022-02-24 MED ORDER — BUPIVACAINE LIPOSOME 1.3 % IJ SUSP
INTRAMUSCULAR | Status: AC
  Filled 2022-02-24: qty 20

## 2022-02-24 MED ORDER — MEPERIDINE HCL 25 MG/ML IJ SOLN: 6.2500 mg | INTRAMUSCULAR | Status: DC | PRN

## 2022-02-24 MED ORDER — MORPHINE SULFATE (PF) 0.5 MG/ML IJ SOLN
INTRAMUSCULAR | Status: DC | PRN
  Administered 2022-02-24: .1 mg via INTRATHECAL

## 2022-02-24 MED ORDER — NALOXONE HCL 0.4 MG/ML IJ SOLN: 0.4000 mg | INTRAMUSCULAR | Status: DC | PRN

## 2022-02-24 MED ORDER — WITCH HAZEL-GLYCERIN EX PADS: 1.0000 | MEDICATED_PAD | CUTANEOUS | Status: DC | PRN

## 2022-02-24 MED ORDER — PHENYLEPHRINE HCL-NACL 20-0.9 MG/250ML-% IV SOLN
INTRAVENOUS | Status: AC
  Filled 2022-02-24: qty 250

## 2022-02-24 MED ORDER — MAGNESIUM HYDROXIDE 400 MG/5ML PO SUSP: 30.0000 mL | ORAL | Status: DC | PRN

## 2022-02-24 MED ORDER — KETOROLAC TROMETHAMINE 30 MG/ML IJ SOLN: 30.0000 mg | Freq: Four times a day (QID) | INTRAMUSCULAR | Status: DC | PRN

## 2022-02-24 MED ORDER — FENTANYL CITRATE (PF) 100 MCG/2ML IJ SOLN: 25.0000 ug | INTRAMUSCULAR | Status: DC | PRN

## 2022-02-24 MED ORDER — COCONUT OIL OIL: 1.0000 | TOPICAL_OIL | Status: DC | PRN

## 2022-02-24 MED ORDER — OXYTOCIN-SODIUM CHLORIDE 30-0.9 UT/500ML-% IV SOLN
INTRAVENOUS | Status: AC
  Filled 2022-02-24: qty 1000

## 2022-02-24 MED ORDER — KETOROLAC TROMETHAMINE 30 MG/ML IJ SOLN
INTRAMUSCULAR | Status: DC | PRN
  Administered 2022-02-24: 30 mg via INTRAVENOUS

## 2022-02-24 MED ORDER — SODIUM CHLORIDE (PF) 0.9 % IJ SOLN
INTRAMUSCULAR | Status: AC
  Filled 2022-02-24: qty 50

## 2022-02-24 MED ORDER — LACTATED RINGERS IV SOLN: INTRAVENOUS | Status: DC

## 2022-02-24 MED ORDER — IBUPROFEN 600 MG PO TABS
600.0000 mg | ORAL_TABLET | Freq: Four times a day (QID) | ORAL | Status: DC
  Administered 2022-02-25 – 2022-02-26 (×2): 600 mg via ORAL
  Filled 2022-02-24 (×2): qty 1

## 2022-02-24 MED ORDER — POVIDONE-IODINE 10 % EX SWAB
2.0000 | Freq: Once | CUTANEOUS | Status: AC
  Administered 2022-02-24: 2 via TOPICAL

## 2022-02-24 MED ORDER — PRENATAL MULTIVITAMIN CH
1.0000 | ORAL_TABLET | Freq: Every day | ORAL | Status: DC
  Administered 2022-02-25: 1 via ORAL
  Filled 2022-02-24 (×2): qty 1

## 2022-02-24 MED ORDER — MORPHINE SULFATE (PF) 0.5 MG/ML IJ SOLN
INTRAMUSCULAR | Status: AC
  Filled 2022-02-24: qty 10

## 2022-02-24 MED ORDER — BUPIVACAINE LIPOSOME 1.3 % IJ SUSP
INTRAMUSCULAR | Status: DC | PRN
  Administered 2022-02-24: 20 mL

## 2022-02-24 MED ORDER — BUPIVACAINE IN DEXTROSE 0.75-8.25 % IT SOLN
INTRATHECAL | Status: DC | PRN
  Administered 2022-02-24: 1.4 mL via INTRATHECAL

## 2022-02-24 MED ORDER — SODIUM CHLORIDE 0.9% FLUSH: 3.0000 mL | INTRAVENOUS | Status: DC | PRN

## 2022-02-24 MED ORDER — OXYTOCIN-SODIUM CHLORIDE 30-0.9 UT/500ML-% IV SOLN
INTRAVENOUS | Status: DC | PRN
  Administered 2022-02-24: 30 [IU] via INTRAVENOUS

## 2022-02-24 MED ORDER — BUPIVACAINE HCL 0.5 % IJ SOLN
INTRAMUSCULAR | Status: DC | PRN
  Administered 2022-02-24: 30 mL

## 2022-02-24 MED ORDER — EPHEDRINE SULFATE (PRESSORS) 50 MG/ML IJ SOLN
INTRAMUSCULAR | Status: DC | PRN
  Administered 2022-02-24: 5 mg via INTRAVENOUS

## 2022-02-24 MED ORDER — HYDROMORPHONE HCL 1 MG/ML IJ SOLN: 1.0000 mg | INTRAMUSCULAR | Status: DC | PRN

## 2022-02-24 MED ORDER — FERROUS SULFATE 325 (65 FE) MG PO TABS
325.0000 mg | ORAL_TABLET | ORAL | Status: DC
  Administered 2022-02-25: 325 mg via ORAL
  Filled 2022-02-24: qty 1

## 2022-02-24 MED ORDER — CHLORHEXIDINE GLUCONATE 0.12 % MT SOLN
15.0000 mL | Freq: Once | OROMUCOSAL | Status: AC
  Administered 2022-02-24: 15 mL via OROMUCOSAL
  Filled 2022-02-24: qty 15

## 2022-02-24 MED ORDER — CEFAZOLIN SODIUM-DEXTROSE 2-4 GM/100ML-% IV SOLN
2.0000 g | INTRAVENOUS | Status: AC
  Administered 2022-02-24: 2 g via INTRAVENOUS
  Filled 2022-02-24: qty 100

## 2022-02-24 MED ORDER — NALOXONE HCL 4 MG/10ML IJ SOLN: 1.0000 ug/kg/h | INTRAVENOUS | Status: DC | PRN

## 2022-02-24 MED ORDER — ONDANSETRON HCL 4 MG/2ML IJ SOLN: 4.0000 mg | Freq: Three times a day (TID) | INTRAMUSCULAR | Status: DC | PRN

## 2022-02-24 MED ORDER — SIMETHICONE 80 MG PO CHEW
80.0000 mg | CHEWABLE_TABLET | ORAL | Status: DC | PRN
  Administered 2022-02-25: 80 mg via ORAL
  Filled 2022-02-24: qty 1

## 2022-02-24 MED ORDER — SENNOSIDES-DOCUSATE SODIUM 8.6-50 MG PO TABS
2.0000 | ORAL_TABLET | Freq: Every day | ORAL | Status: DC
  Administered 2022-02-25 – 2022-02-26 (×2): 2 via ORAL
  Filled 2022-02-24 (×2): qty 2

## 2022-02-24 MED ORDER — KETOROLAC TROMETHAMINE 30 MG/ML IJ SOLN
INTRAMUSCULAR | Status: AC
  Filled 2022-02-24: qty 1

## 2022-02-24 MED ORDER — GABAPENTIN 300 MG PO CAPS
300.0000 mg | ORAL_CAPSULE | ORAL | Status: AC
  Administered 2022-02-24: 300 mg via ORAL
  Filled 2022-02-24: qty 1

## 2022-02-24 MED ORDER — ZOLPIDEM TARTRATE 5 MG PO TABS: 5.0000 mg | ORAL_TABLET | Freq: Every evening | ORAL | Status: DC | PRN

## 2022-02-24 MED ORDER — LACTATED RINGERS IV SOLN: Freq: Once | INTRAVENOUS | Status: AC

## 2022-02-24 MED ORDER — DIPHENHYDRAMINE HCL 50 MG/ML IJ SOLN: 12.5000 mg | INTRAMUSCULAR | Status: DC | PRN

## 2022-02-24 MED ORDER — ONDANSETRON HCL 4 MG/2ML IJ SOLN: 4.0000 mg | Freq: Once | INTRAMUSCULAR | Status: DC | PRN

## 2022-02-24 MED ORDER — BUPIVACAINE HCL (PF) 0.5 % IJ SOLN
INTRAMUSCULAR | Status: AC
  Filled 2022-02-24: qty 30

## 2022-02-24 MED ORDER — FAMOTIDINE 20 MG PO TABS
20.0000 mg | ORAL_TABLET | Freq: Once | ORAL | Status: AC
  Administered 2022-02-24: 20 mg via ORAL
  Filled 2022-02-24: qty 1

## 2022-02-24 MED ORDER — MENTHOL 3 MG MT LOZG: 1.0000 | LOZENGE | OROMUCOSAL | Status: DC | PRN

## 2022-02-24 MED ORDER — GABAPENTIN 300 MG PO CAPS
300.0000 mg | ORAL_CAPSULE | Freq: Two times a day (BID) | ORAL | Status: DC
  Administered 2022-02-24 – 2022-02-26 (×4): 300 mg via ORAL
  Filled 2022-02-24 (×4): qty 1

## 2022-02-24 MED ORDER — ACETAMINOPHEN 500 MG PO TABS: 1000.0000 mg | ORAL_TABLET | Freq: Four times a day (QID) | ORAL | Status: DC

## 2022-02-24 MED ORDER — PHENYLEPHRINE HCL-NACL 20-0.9 MG/250ML-% IV SOLN
INTRAVENOUS | Status: DC | PRN
  Administered 2022-02-24: 50 ug/min via INTRAVENOUS

## 2022-02-24 MED ORDER — OXYCODONE HCL 5 MG PO TABS
5.0000 mg | ORAL_TABLET | ORAL | Status: DC | PRN
  Administered 2022-02-24 (×2): 5 mg via ORAL
  Administered 2022-02-25: 10 mg via ORAL
  Administered 2022-02-25 – 2022-02-26 (×5): 5 mg via ORAL
  Administered 2022-02-26 (×2): 10 mg via ORAL
  Filled 2022-02-24: qty 2
  Filled 2022-02-24: qty 1
  Filled 2022-02-24 (×2): qty 2
  Filled 2022-02-24 (×7): qty 1

## 2022-02-24 MED ORDER — ORAL CARE MOUTH RINSE: 15.0000 mL | Freq: Once | OROMUCOSAL | Status: AC

## 2022-02-24 MED ORDER — ACETAMINOPHEN 500 MG PO TABS
1000.0000 mg | ORAL_TABLET | Freq: Four times a day (QID) | ORAL | Status: DC
  Administered 2022-02-24 – 2022-02-25 (×4): 1000 mg via ORAL
  Filled 2022-02-24 (×6): qty 2

## 2022-02-24 MED ORDER — OXYTOCIN-SODIUM CHLORIDE 30-0.9 UT/500ML-% IV SOLN: 2.5000 [IU]/h | INTRAVENOUS | Status: AC

## 2022-02-24 MED ORDER — ONDANSETRON HCL 4 MG/2ML IJ SOLN
INTRAMUSCULAR | Status: DC | PRN
  Administered 2022-02-24: 4 mg via INTRAVENOUS

## 2022-02-24 MED ORDER — DIPHENHYDRAMINE HCL 25 MG PO CAPS: 25.0000 mg | ORAL_CAPSULE | ORAL | Status: DC | PRN

## 2022-02-24 MED ORDER — FENTANYL CITRATE (PF) 100 MCG/2ML IJ SOLN
INTRAMUSCULAR | Status: AC
  Filled 2022-02-24: qty 2

## 2022-02-24 SURGICAL SUPPLY — 27 items
BAG COUNTER SPONGE SURGICOUNT (BAG) ×1 IMPLANT
CHLORAPREP W/TINT 26 (MISCELLANEOUS) ×2 IMPLANT
DRSG TELFA 3X8 NADH STRL (GAUZE/BANDAGES/DRESSINGS) ×1 IMPLANT
ELECT REM PT RETURN 9FT ADLT (ELECTROSURGICAL) ×1
ELECTRODE REM PT RTRN 9FT ADLT (ELECTROSURGICAL) ×1 IMPLANT
EXTRT SYSTEM ALEXIS 17CM (MISCELLANEOUS)
GAUZE SPONGE 4X4 12PLY STRL (GAUZE/BANDAGES/DRESSINGS) ×1 IMPLANT
GLOVE BIO SURGEON STRL SZ 6.5 (GLOVE) ×1 IMPLANT
GLOVE SURG UNDER LTX SZ7 (GLOVE) ×1 IMPLANT
GOWN STRL REUS W/ TWL LRG LVL3 (GOWN DISPOSABLE) ×2 IMPLANT
GOWN STRL REUS W/TWL LRG LVL3 (GOWN DISPOSABLE) ×2
KIT TURNOVER KIT A (KITS) ×1 IMPLANT
MANIFOLD NEPTUNE II (INSTRUMENTS) ×1 IMPLANT
MAT PREVALON FULL STRYKER (MISCELLANEOUS) ×1 IMPLANT
NS IRRIG 1000ML POUR BTL (IV SOLUTION) ×1 IMPLANT
PACK C SECTION AR (MISCELLANEOUS) ×1 IMPLANT
PAD OB MATERNITY 4.3X12.25 (PERSONAL CARE ITEMS) ×1 IMPLANT
PAD PREP 24X41 OB/GYN DISP (PERSONAL CARE ITEMS) ×1 IMPLANT
SCRUB CHG 4% DYNA-HEX 4OZ (MISCELLANEOUS) ×1 IMPLANT
SUT MNCRL AB 4-0 PS2 18 (SUTURE) ×1 IMPLANT
SUT PLAIN 2 0 XLH (SUTURE) IMPLANT
SUT VIC AB 0 CT1 36 (SUTURE) ×4 IMPLANT
SUT VIC AB 3-0 SH 27 (SUTURE) ×1
SUT VIC AB 3-0 SH 27X BRD (SUTURE) ×1 IMPLANT
SYSTEM CONTND EXTRCTN KII BLLN (MISCELLANEOUS) IMPLANT
TRAP FLUID SMOKE EVACUATOR (MISCELLANEOUS) ×1 IMPLANT
WATER STERILE IRR 500ML POUR (IV SOLUTION) ×1 IMPLANT

## 2022-02-24 NOTE — Transfer of Care (Signed)
Immediate Anesthesia Transfer of Care Note  Patient: [name]  Procedure(s) Performed: CESAREAN SECTION  Patient Location: LDR 6  Anesthesia Type:Spinal  Level of Consciousness: awake, alert , and oriented  Airway & Oxygen Therapy: Patient Spontanous Breathing  Post-op Assessment: Report given to RN and Post -op Vital signs reviewed and stable  Post vital signs: Reviewed and stable  Last Vitals:  Vitals Value Taken Time  BP 107/51 02/24/22 1345  Temp    Pulse 80 02/24/22 1345  Resp 8 02/24/22 1345  SpO2 99 % 02/24/22 1345    Last Pain:  Vitals:   02/24/22 1345  TempSrc:   PainSc: 0-No pain         Complications: No notable events documented.

## 2022-02-24 NOTE — Anesthesia Preprocedure Evaluation (Addendum)
Anesthesia Evaluation  Patient identified by MRN, date of birth, ID band Patient awake    Reviewed: Allergy & Precautions, H&P , NPO status , Patient's Chart, lab work & pertinent test results, reviewed documented beta blocker date and time   History of Anesthesia Complications (+) PONV and history of anesthetic complications  Airway Mallampati: II  TM Distance: >3 FB Neck ROM: full    Dental no notable dental hx. (+) Teeth Intact   Pulmonary asthma , former smoker   Pulmonary exam normal breath sounds clear to auscultation       Cardiovascular Exercise Tolerance: Good hypertension, On Medications  Rhythm:regular Rate:Normal     Neuro/Psych  PSYCHIATRIC DISORDERS Anxiety Depression    negative neurological ROS     GI/Hepatic Neg liver ROS, PUD,,,  Endo/Other  negative endocrine ROSdiabetes, Gestational    Renal/GU      Musculoskeletal   Abdominal   Peds  Hematology negative hematology ROS (+)   Anesthesia Other Findings   Reproductive/Obstetrics (+) Pregnancy                             Anesthesia Physical Anesthesia Plan  ASA: 2  Anesthesia Plan: Spinal   Post-op Pain Management:    Induction:   PONV Risk Score and Plan: 3  Airway Management Planned:   Additional Equipment:   Intra-op Plan:   Post-operative Plan:   Informed Consent: I have reviewed the patients History and Physical, chart, labs and discussed the procedure including the risks, benefits and alternatives for the proposed anesthesia with the patient or authorized representative who has indicated his/her understanding and acceptance.       Plan Discussed with:   Anesthesia Plan Comments:        Anesthesia Quick Evaluation

## 2022-02-24 NOTE — Lactation Note (Signed)
This note was copied from a baby's chart. Lactation Consultation Note  Patient Name: [name] Today's Date: 02/24/2022 Reason for consult: Initial assessment;Early term 37-38.6wks;Infant < 6lbs;Other (Comment) (repeat c-section; IUGR) Age:26 years  Maternal Data Has patient been taught Hand Expression?: Yes Does the patient have breastfeeding experience prior to this delivery?: Yes How long did the patient breastfeed?: 2 months  P3, repeat c-section at [redacted]w[redacted]d. Infant is IUGR. Mom has hx of Anxiety/depression, +MJ on admission. Per NNP baby being treated as late preterm. Feeding and glycemia protocol initiated. Past 2 glucoses have been 70, and 75, 1 low sugar of 38 post first feeding following delivery.  Feeding Mother's Current Feeding Choice: Breast Milk and Formula Nipple Type: Slow - flow  Feeding plan for baby: -Feed baby every 3 hours. Offer breast first. If baby is too sleepy for feeding at the breast provide formula supplementation. If baby does latch- latch no longer than 10-15 minutes. -Supplement of appropriate volumes to be fed post BF/BF attempt of higher calorie (22kcal) formula. -Mom to pump post BF/BF attempt; every 3 hours.  LATCH Score Latch: Repeated attempts needed to sustain latch, nipple held in mouth throughout feeding, stimulation needed to elicit sucking reflex.  Audible Swallowing: Spontaneous and intermittent  Type of Nipple: Everted at rest and after stimulation  Comfort (Breast/Nipple): Soft / non-tender  Hold (Positioning): Assistance needed to correctly position infant at breast and maintain latch.  LATCH Score: 8  Mom independent with waking baby for feeding at the 3 hour mark. Mom placed in cradle hold and sandwiched breast tissue, baby finally did wake to feed- latched without the nipple shield and sustained latch. Baby has sucks that appeared deep and rhythmic and swallows heard. Reiterated importance of mom to BF no longer than  10-15 minutes and to provide supplement after.  Lactation Tools Discussed/Used Tools: Pump;Nipple Dorris Carnes;Bottle Nipple shield size: 20 (given post delivery by Transition; baby latched this feeding without it) Breast pump type: Double-Electric Breast Pump Pump Education: Setup, frequency, and cleaning;Milk Storage Reason for Pumping: IUGR; treating as LPT Pumping frequency: q 3 hours  Educated mom on pump use, frequency, and benefit of using the pump with baby's weeks gestation and size. Provided education on normal course of lactation and milk supply and demand and establishment of a plentiful milk supply.  Interventions Interventions: Breast feeding basics reviewed;Assisted with latch;Hand express;Breast compression;Adjust position;Support pillows;DEBP;Education;Pace feeding (LPT feeding plan)  Discharge Pump: DEBP;Personal  Consult Status Consult Status: Follow-up    Danford Bad 02/24/2022, 5:54 PM

## 2022-02-24 NOTE — Consult Note (Signed)
Neonatology Note:   Attendance at C-section:    I was asked by Dr. Valentino Saxon to attend this repeat C/S delivery at 37.5w for IUGR. The mother is a [id number] who is GBS neg with good prenatal care known IUGR, also complicated by drug [facility]) use and depression.  ROM 0h 27m prior to delivery, fluid clear. Infant vigorous with good spontaneous cry and tone. +60 sec DCC done.  Needed minimal bulb suctioning. No WOB, good tone and pink in DR.  Apgars 8 at 1 minute, 9 at 5 minutes.  +void and meconium in OR.  BW 2030g. Family updated.  To MBU in care of Pediatrician.  Dineen Kid Leary Roca, MD Neonatologist 02/24/2022, 1:54 PM

## 2022-02-24 NOTE — H&P (Signed)
Obstetric Preoperative History and Physical  Ikram Arrie Aran Ungar is a 26 y.o. [phone] with IUP at [redacted]w[redacted]d presenting for presenting for scheduled repeat cesarean section due to severe IUGR in pregnancy (growth <1%ile).  No acute concerns.   Prenatal Course Source of Care: [facility] with onset of care at 6 weeks Pregnancy complications or risks: Patient Active Problem List   Diagnosis Date Noted   [redacted] weeks gestation of pregnancy 02/17/2022   Labor and delivery, indication for care 02/05/2022   [redacted] weeks gestation of pregnancy 01/25/2022   IUGR (intrauterine growth restriction) affecting care of mother 12/23/2021   Supervision of high-risk pregnancy [id number]   Biliary colic [id number]   Drug use 05/11/2017   History of C-section 11/29/2016   Depressive disorder 07/19/2010   Extrinsic asthma 04/03/2009   She plans to breastfeed She desires no method for postpartum contraception.   Prenatal labs and studies: ABO, Rh: --/--/O POS (12/26 1027) Antibody: NEG (12/26 1027) Rubella: 1.30 (06/29 0815) RPR: NON REACTIVE (12/26 1027)  HBsAg: Negative (06/29 0815)  HIV: Non Reactive (06/29 0815)  [id number]-- (12/13 1034) 1 hr Glucola  normal Genetic screening normal Anatomy US normal   Past Medical History:  Diagnosis Date   Abdominal pain    Anxiety    Asthma    Depression    Hypertension    during pregancy   Nausea    Peptic ulcer disease    PONV (postoperative nausea and vomiting)    Pre-diabetes (history)    better with weight loss    Past Surgical History:  Procedure Laterality Date   CESAREAN SECTION N/A 11/29/2016   Procedure: CESAREAN SECTION;  Surgeon: Brayton Mars, MD;  Location: [facility] ORS;  Service: Obstetrics;  Laterality: N/A;   CESAREAN SECTION N/A 12/30/2017   Procedure: REPEAT CESAREAN SECTION;  Surgeon: [name], MD;  Location: [facility] ORS;  Service: Obstetrics;  Laterality: N/A;   CHOLECYSTECTOMY     TONSILLECTOMY     WISDOM  TOOTH EXTRACTION      OB History  Gravida Para Term Preterm AB Living  5 2 2   2 2   SAB IAB Ectopic Multiple Live Births  2     0 2    # Outcome Date GA Lbr Len/2nd Weight Sex Delivery Anes PTL Lv  5 Current           4 Term 12/30/17 [redacted]w[redacted]d  3040 g F CS-LTranv Spinal  LIV  3 Term 11/29/16 [redacted]w[redacted]d  3020 g M CS-LTranv EPI  LIV  2 SAB 2015        ND  1 SAB 2012        ND    Social History   Socioeconomic History   Marital status: Significant Other    Spouse name: Rodman Key   Number of children: 2   Years of education: 10   Highest education level: Not on file  Occupational History   Occupation: full time    Comment: DayCare  Tobacco Use   Smoking status: Former    Packs/day: 0.50    Types: Cigarettes   Smokeless tobacco: Never  Vaping Use   Vaping Use: Never used  Substance and Sexual Activity   Alcohol use: No   Drug use: Not Currently    Comment: Denies current use    Sexual activity: Yes    Partners: Male    Birth control/protection: None    Comment: Nexplanon   Other Topics Concern   Not on file  Social  History Narrative   Lives with her kids and parents   Right Handed   Drinks 1-2 cups caffeine daily   Social Determinants of Health   Financial Resource Strain: Low Risk  (08/18/2021)   Overall Financial Resource Strain (CARDIA)    Difficulty of Paying Living Expenses: Not hard at all  Food Insecurity: No Food Insecurity (02/24/2022)   Hunger Vital Sign    Worried About Running Out of Food in the Last Year: Never true    Ran Out of Food in the Last Year: Never true  Transportation Needs: No Transportation Needs (02/24/2022)   PRAPARE - Hydrologist (Medical): No    Lack of Transportation (Non-Medical): No  Physical Activity: Inactive (08/18/2021)   Exercise Vital Sign    Days of Exercise per Week: 0 days    Minutes of Exercise per Session: 0 min  Stress: No Stress Concern Present (08/18/2021)   [place]    Feeling of Stress : Not at all  Social Connections: Moderately Isolated (08/18/2021)   Social Connection and Isolation Panel [NHANES]    Frequency of Communication with Friends and Family: More than three times a week    Frequency of Social Gatherings with Friends and Family: Once a week    Attends Religious Services: Never    Marine scientist or Organizations: No    Attends Music therapist: Never    Marital Status: Living with partner    Family History  Problem Relation Age of Onset   Cholelithiasis Mother    SIDS Brother    Cholelithiasis Maternal Grandmother    Ulcers Maternal Grandmother    Diabetes Maternal Grandfather    Celiac disease Neg Hx     Medications Prior to Admission  Medication Sig Dispense Refill Last Dose   cyclobenzaprine (FLEXERIL) 10 MG tablet Take 1 tablet (10 mg total) by mouth every 8 (eight) hours as needed for muscle spasms. 10 tablet 1    Prenatal Multivit-Min-FA ([facility] PRENATAL GUMMY) 0.4 MG CHEW Chew 1 tablet by mouth daily. 90 tablet 3     Allergies  Allergen Reactions   Metformin And Related Shortness Of Breath   Sprintec 28 [Norgestimate-Eth Estradiol] Hives    Review of Systems: Negative except for what is mentioned in HPI.  Physical Exam: BP 118/78 (BP Location: Left Arm)   Pulse 95   Temp 98.1 F (36.7 C) (Oral)   Resp 15   Ht 4\' 11"  (1.499 m)   Wt 62.1 kg   LMP 06/05/2021 (Exact Date)   BMI 27.67 kg/m  FHR by Doppler: 135 bpm GENERAL: Well-developed, well-nourished female in no acute distress.  LUNGS: Clear to auscultation bilaterally.  HEART: Regular rate and rhythm. ABDOMEN: Soft, nontender, nondistended, gravid, well-healed Pfannenstiel incision. PELVIC: Deferred EXTREMITIES: Nontender, no edema, 2+ distal pulses.   Pertinent Labs/Studies:   Results for orders placed or performed during the hospital encounter of 02/23/22 (from the past 72 hour(s))  CBC      Status: Abnormal   Collection Time: 02/23/22 10:27 AM  Result Value Ref Range   WBC 8.6 4.0 - 10.5 K/uL   RBC 4.01 3.87 - 5.11 MIL/uL   Hemoglobin 11.6 (L) 12.0 - 15.0 g/dL   HCT 35.8 (L) 36.0 - 46.0 %   MCV 89.3 80.0 - 100.0 fL   MCH 28.9 26.0 - 34.0 pg   MCHC 32.4 30.0 - 36.0 g/dL   RDW  12.6 11.5 - 15.5 %   Platelets 258 150 - 400 K/uL   nRBC 0.0 0.0 - 0.2 %    Comment: Performed at [facility], [address] [place], [place] [address]  RPR     Status: None   Collection Time: 02/23/22 10:27 AM  Result Value Ref Range   RPR Ser Ql NON REACTIVE NON REACTIVE    Comment: Performed at [facility], [place] [address] [place], [place] [address]  Type and screen     Status: None   Collection Time: 02/23/22 10:27 AM  Result Value Ref Range   ABO/RH(D) O POS    Antibody Screen NEG    Sample Expiration 02/26/2022,2359    Extend sample reason      PREGNANT WITHIN 3 MONTHS, UNABLE TO EXTEND Performed at [facility], [address] [place], [place] [address]     Imaging:  [place] MFM FETAL BPP W/NONSTRESS ----------------------------------------------------------------------  OBSTETRICS REPORT                       (Signed Final 02/18/2022 10:30 am) ---------------------------------------------------------------------- Patient Info  ID #:       [id number]                          D.O.B.:  [birth year] (26 yrs)  Name:       Hulen Luster Obenchain            Visit Date: 02/18/2022 09:57 am ---------------------------------------------------------------------- Performed By  Attending:        Johnell Comings MD         Referred By:      Rod Can  Performed By:     Rolm Bookbinder RDMS     [facility] at                                                             [facility] ---------------------------------------------------------------------- Orders  #  Description                            Code        Ordered By  1  [place] MFM OB FOLLOW UP                   [id number]    Sander Nephew  2  [place] MFM UA CORD DOPPLER                [phone]  Sander Nephew  3  [place] MFM FETAL BPP                      [id number]     Everlena Cooper                                       BOOKER ----------------------------------------------------------------------  #  Order #                     Accession #                Episode #  1  [id number]                   [id number]                 [id number]  2  [id number]                   [id number]                 [id number]  3  [id number]                   [id number]                 [id number] ---------------------------------------------------------------------- Indications  Maternal care for known or suspected poor      O36.5930  fetal growth, third trimester, not applicable or  unspecified IUGR  Previous cesarean delivery, antepartum (x2)    Q000111Q  Drug use complicating pregnancy, third         O99.323  trimester (THC)  [redacted] weeks gestation of pregnancy                Z3A.36  Asthma                                         O99.89 j45.909 ---------------------------------------------------------------------- Fetal Evaluation  Num Of Fetuses:         1  Fetal Heart Rate(bpm):  129  Cardiac Activity:       Observed  Presentation:           Cephalic  Placenta:               Anterior  Amniotic Fluid  AFI FV:      Subjectively low-normal  AFI Sum(cm)     %Tile       Largest Pocket(cm)  6.84            3.5         3.05  RUQ(cm)       RLQ(cm)       LUQ(cm)        LLQ(cm)  2.38          0             1.41           3.05 ---------------------------------------------------------------------- Biophysical Evaluation  Amniotic F.V:  Within normal limits       F. Tone:        Observed  F. Movement:    Observed                    Score:          8/8  F. Breathing:   Observed ---------------------------------------------------------------------- Biometry  BPD:     80.05  mm     G. Age:  32w 1d        < 1  %    CI:        73.24   %    70 - 86                                                          FL/HC:      22.0   %    20.8 - 22.6  HC:    297.27   mm     G. Age:  32w 6d        < 1  %    HC/AC:      1.00        0.92 - 1.05  AC:    297.59   mm     G. Age:  33w 5d        2.1  %    FL/BPD:     81.7   %    71 - 87  FL:      65.37  mm     G. Age:  33w 5d        1.2  %    FL/AC:      22.0   %    20 - 24  Est. FW:    2205  gm    4 lb 14 oz     1.9  % ---------------------------------------------------------------------- OB History  Gravidity:    5         Term:   2        Prem:   0        SAB:   2  TOP:          0       Ectopic:  0        Living: 2 ---------------------------------------------------------------------- Gestational Age  LMP:           36w 6d        Date:  06/05/21                   EDD:   03/12/22  U/S Today:     33w 1d                                        EDD:   04/07/22  Best:          36w 6d     Det. By:  LMP  (06/05/21)          EDD:   03/12/22 ---------------------------------------------------------------------- Doppler - Fetal Vessels  Umbilical Artery   S/D     %tile      RI    %tile      PI    %tile  PSV    ADFV    RDFV                                                     (cm/s)   3.12       88    0.68       91    1.09       92     47.9      No      No ---------------------------------------------------------------------- Comments  This patient was seen due to IUGR.  She denies any  problems since her last exam and reports feeling vigorous  fetal movements throughout the day.  On today's exam, the EFW of 4 pounds 14 ounces measures  at the 2nd percentile for her gestational age indicating IUGR.  The fetus has grown over 1 pound over the past 3 weeks.  The total AFI was 6.84 cm (low normal  range).  A BPP performed today was 8 out of 8.  Doppler studies of the umbilical arteries showed a normal  S/D ratio of 3.12 .  There were no signs of absent or reversed  end-diastolic flow.  Due to severe IUGR, she already has a repeat cesarean  delivery scheduled next week on February 24, 2022.  No further exams were scheduled in our office. ----------------------------------------------------------------------                   Johnell Comings, MD Electronically Signed Final Report   02/18/2022 10:30 am ---------------------------------------------------------------------- [place] MFM UA CORD DOPPLER ----------------------------------------------------------------------  OBSTETRICS REPORT                       (Signed Final 02/18/2022 10:30 am) ---------------------------------------------------------------------- Patient Info  ID #:       [id number]                          D.O.B.:  06-Apr-1995 (26 yrs)  Name:       Hulen Luster [place]            Visit Date: 02/18/2022 09:57 am ---------------------------------------------------------------------- Performed By  Attending:        Johnell Comings MD         Referred By:      Rod Can  Performed By:     Rolm Bookbinder RDMS     [facility]         [facility] at                                                             [facility] ---------------------------------------------------------------------- Orders  #  Description  Code        Ordered By  1  [place] MFM OB FOLLOW UP                   [id number]    Lin Landsman  2  [place] MFM UA CORD DOPPLER                76820.02    Lin Landsman  3  [place] MFM FETAL BPP                      30160.1     [facility]     W/NONSTRESS                                        BOOKER ----------------------------------------------------------------------  #  Order #                     Accession #                Episode #  1  [id number]                   [id number]                 [id number]  2  [id number]                   [id number]                 [id number]  3  [id number]                   [id number]                 [id number] ---------------------------------------------------------------------- Indications  Maternal care for known or suspected poor      O36.5930  fetal growth, third trimester, not applicable or  unspecified IUGR  Previous cesarean delivery, antepartum (x2)    O34.219  Drug use complicating pregnancy, third         O99.323  trimester (THC)  [redacted] weeks gestation of pregnancy                Z3A.36  Asthma                                         O99.89 j45.909 ---------------------------------------------------------------------- Fetal Evaluation  Num Of Fetuses:         1  Fetal Heart Rate(bpm):  129  Cardiac Activity:       Observed  Presentation:           Cephalic  Placenta:  Anterior  Amniotic Fluid  AFI FV:      Subjectively low-normal  AFI Sum(cm)     %Tile       Largest Pocket(cm)  6.84            3.5         3.05  RUQ(cm)       RLQ(cm)       LUQ(cm)        LLQ(cm)  2.38          0             1.41           3.05 ---------------------------------------------------------------------- Biophysical Evaluation  Amniotic F.V:   Within normal limits       F. Tone:        Observed  F. Movement:    Observed                   Score:          8/8  F. Breathing:   Observed ---------------------------------------------------------------------- Biometry  BPD:     80.05  mm     G. Age:  32w 1d        < 1  %    CI:        73.24   %    70 - 86                                                          FL/HC:      22.0   %    20.8 - 22.6  HC:    297.27   mm     G. Age:  32w 6d        < 1  %    HC/AC:      1.00        0.92 - 1.05  AC:     297.59   mm     G. Age:  33w 5d        2.1  %    FL/BPD:     81.7   %    71 - 87  FL:      65.37  mm     G. Age:  33w 5d        1.2  %    FL/AC:      22.0   %    20 - 24  Est. FW:    2205  gm    4 lb 14 oz     1.9  % ---------------------------------------------------------------------- OB History  Gravidity:    5         Term:   2        Prem:   0        SAB:   2  TOP:          0       Ectopic:  0        Living: 2 ---------------------------------------------------------------------- Gestational Age  LMP:           36w 6d        Date:  06/05/21                   EDD:   03/12/22  U/S Today:  33w 1d                                        EDD:   04/07/22  Best:          36w 6d     Det. By:  LMP  (06/05/21)          EDD:   03/12/22 ---------------------------------------------------------------------- Doppler - Fetal Vessels  Umbilical Artery   S/D     %tile      RI    %tile      PI    %tile     PSV    ADFV    RDFV                                                     (cm/s)   3.12       88    0.68       91    1.09       92     47.9      No      No ---------------------------------------------------------------------- Comments  This patient was seen due to IUGR.  She denies any  problems since her last exam and reports feeling vigorous  fetal movements throughout the day.  On today's exam, the EFW of 4 pounds 14 ounces measures  at the 2nd percentile for her gestational age indicating IUGR.  The fetus has grown over 1 pound over the past 3 weeks.  The total AFI was 6.84 cm (low normal range).  A BPP performed today was 8 out of 8.  Doppler studies of the umbilical arteries showed a normal  S/D ratio of 3.12 .  There were no signs of absent or reversed  end-diastolic flow.  Due to severe IUGR, she already has a repeat cesarean  delivery scheduled next week on February 24, 2022.  No further exams were scheduled in our  office. ----------------------------------------------------------------------                   Johnell Comings, MD Electronically Signed Final Report   02/18/2022 10:30 am ----------------------------------------------------------------------   Assessment and Plan :Deresa Mulugeta Malinowski is a 26 y.o. [id number] at [redacted]w[redacted]d being admitted  for scheduled repeat cesarean section delivery . The patient is understanding of the planned procedure and is aware of and accepting of all surgical risks, including but not limited to: bleeding which may require transfusion or reoperation; infection which may require antibiotics; injury to bowel, bladder, ureters or other surrounding organs which may require repair; injury to the fetus; need for additional procedures including hysterectomy in the event of life-threatening complications; placental abnormalities wth subsequent pregnancies; incisional problems; blood clot disorders which may require blood thinners;, and other postoperative/anesthesia complications. The patient is in agreement with the proposed plan, and gives informed written consent for the procedure. All questions have been answered.    [name], MD [facility] at [facility]

## 2022-02-24 NOTE — Anesthesia Procedure Notes (Signed)
Spinal  Patient location during procedure: OR Start time: 02/24/2022 12:19 PM End time: 02/24/2022 12:21 PM Reason for block: surgical anesthesia Staffing Performed: resident/CRNA  Anesthesiologist: Molli Barrows, MD Resident/CRNA: Hedda Slade, CRNA Performed by: Hedda Slade, CRNA Authorized by: Molli Barrows, MD   Preanesthetic Checklist Completed: patient identified, IV checked, site marked, risks and benefits discussed, surgical consent, monitors and equipment checked, pre-op evaluation and timeout performed Spinal Block Patient position: sitting Prep: ChloraPrep Patient monitoring: heart rate, continuous pulse ox, blood pressure and cardiac monitor Approach: midline Location: L3-4 Injection technique: single-shot Needle Needle type: Whitacre and Introducer  Needle gauge: 24 G Needle length: 9 cm Assessment Events: CSF return Additional Notes Sterile aseptic technique used throughout the procedure.  Negative paresthesia. Negative blood return. Positive free-flowing CSF. Expiration date of kit checked and confirmed. Patient tolerated procedure well, without complications.

## 2022-02-24 NOTE — Op Note (Signed)
Cesarean Section Procedure Note  Indications: previous uterine incision - Prior C-section x 2, declines TOLAC, severe IUGR  Pre-operative Diagnosis: 37 week 5 day pregnancy, severe IUGR, history of prior C-section x 2 desiring repeat.  Post-operative Diagnosis: Same  Surgeon: Stephanie Laser, MD  Assistants:  Carie Caddy, CNM  Procedure: Repeat low transverse Cesarean Section  Anesthesia: Spinal anesthesia  Findings: Female infant, cephalic presentation, 2030 grams, with Apgar scores of 8 at one minute and 9 at five minutes. Intact placenta with 3 vessel cord.  Clear amniotic fluid at amniotomy The uterine outline, tubes and ovaries appeared normal.   Procedure Details: The patient was seen in the Holding Room. The risks, benefits, complications, treatment options, and expected outcomes were discussed with the patient.  The patient concurred with the proposed plan, giving informed consent.  The site of surgery properly noted/marked. The patient was taken to the Operating Room, identified as [name] and the procedure verified as C-Section Delivery.   After induction of anesthesia, the patient was draped and prepped in the usual sterile manner. Anesthesia was tested and noted to be adequate. A Time Out was held and the above information confirmed.  A Pfannenstiel incision was made and carried down through the subcutaneous tissue to the fascia. Fascial incision was made and extended transversely. The fascia was separated from the underlying rectus tissue superiorly and inferiorly. The peritoneum was identified and entered. Peritoneal incision was extended longitudinally. The surgical assist was able to provide retraction to allow for clear visualization of surgical site. An Alexis retractor was placed in the abdomen for additional retraction. The utero-vesical peritoneal reflection was incised transversely and the bladder flap was bluntly freed from the lower uterine segment. A low  transverse uterine incision was made. Delivered from cephalic presentation was a 2030 gram Female with Apgar scores of 8 at one minute and 9 at five minutes.  The assistant was able to apply adequate fundal pressure to allow for successful delivery of the fetus. After the umbilical cord was clamped and cut, cord blood was obtained for evaluation. Delayed cord clamping was observed. The placenta was removed intact and appeared normal. The uterus was exteriorized and cleared of all clots and debris. The uterine outline, tubes and ovaries appeared normal.  The uterine incision was closed with running locked sutures of 0-Vicryl.  A second suture of 0-Vicryl was used in an imbricating layer.  Hemostasis was observed. The uterus was then returned to the abdomen. The pericolic gutters were cleared of all clots and debris. The fascia was then grasped with Zannie Cove and Tresa Endo clamps, and injected with a total of 50 ml of 1.3% Exparel solution (20 ml of bupivicaine liposomal mixed with 30 ml of 0.5% Marcaine and diluted in 50 ml of normal saline).  The muscle layer was noted to have several areas of oozing, this was managed with several figure-of-eight sutures of 3-0 Vicryl and cautery.  The muscle and peritoneum were then reapproximated loosely with a 3-0 Vicryl. The fascia was then reapproximated with a running suture of 0-Vicryl. The subcutaneous fat layer was reapproximated with 2-0 Vicryl. The skin was reapproximated with 4-0 Monocryl. The skin and subcutaneous tissues were then injected with an additional 18 ml of the Exparel solution. The incision was covered with steri-strips and a pressure dressing.   Instrument, sponge, and needle counts were correct prior the abdominal closure and at the conclusion of the case.    An experienced assistant was required given the standard of surgical  care given the complexity of the case.  This assistant was needed for exposure, dissection, suctioning, retraction, instrument  exchange, and for overall help during the procedure.  Estimated Blood Loss:  345 ml      Drains: foley catheter to gravity drainage, 150 ml of clear urine at end of the procedure         Total IV Fluids: 1000 ml  Specimens: Placenta         Implants: None         Complications:  None; patient tolerated the procedure well.         Disposition: PACU - hemodynamically stable.         Condition: stable   Stephanie Laser, MD [name] at [facility]

## 2022-02-25 ENCOUNTER — Ambulatory Visit: Payer: Medicaid Other

## 2022-02-25 ENCOUNTER — Other Ambulatory Visit: Payer: Medicaid Other

## 2022-02-25 ENCOUNTER — Encounter: Payer: Self-pay | Admitting: Obstetrics and Gynecology

## 2022-02-25 LAB — CBC
HCT: 31.7 % — ABNORMAL LOW (ref 36.0–46.0)
Hemoglobin: 10.6 g/dL — ABNORMAL LOW (ref 12.0–15.0)
MCH: 29.9 pg (ref 26.0–34.0)
MCHC: 33.4 g/dL (ref 30.0–36.0)
MCV: 89.5 fL (ref 80.0–100.0)
Platelets: 190 10*3/uL (ref 150–400)
RBC: 3.54 MIL/uL — ABNORMAL LOW (ref 3.87–5.11)
RDW: 12.7 % (ref 11.5–15.5)
WBC: 8 10*3/uL (ref 4.0–10.5)
nRBC: 0 % (ref 0.0–0.2)

## 2022-02-25 NOTE — Discharge Summary (Cosign Needed)
OB Discharge Summary     Patient Name: [name] DOB: 1995/09/20 MRN: [id number]  Date of admission: 02/24/2022 Delivering MD: Hildred Laser   Date of discharge: 02/26/2022  Admitting diagnosis: IUGR (intrauterine growth restriction) affecting care of mother [O36.5990] Intrauterine pregnancy: [redacted]w[redacted]d     Secondary diagnosis:  Principal Problem:   IUGR (intrauterine growth restriction) affecting care of mother Active Problems:   History of C-section   Marijuana use during pregnancy   Supervision of high-risk pregnancy   Postpartum care following cesarean delivery   Encounter for care or examination of lactating mother  Additional problems: None     Discharge diagnosis: Term Pregnancy Delivered                                                                                                Post partum procedures: None  Augmentation: N/A  Complications: None  Hospital course:  Sceduled C/S   26 y.o. yo [id number] at [redacted]w[redacted]d was admitted to the hospital 02/24/2022 for scheduled cesarean section with the following indication:Elective Repeat and severe IUGR at term .Delivery details are as follows:  Membrane Rupture Time/Date: 12:54 PM ,02/24/2022   Delivery Method:C-Section, Low Transverse  Details of operation can be found in separate operative note.  Patient had a postpartum course that was uncomplicated.  She is ambulating, tolerating a regular diet, passing flatus, and urinating well. Patient is discharged home in stable condition on  02/26/22        Newborn Data: Birth date:02/24/2022  Birth time:12:55 PM  Gender:Female  Living status:Living  Apgars:8 ,9  Weight:2030 g     Physical exam  Vitals:   02/25/22 0817 02/25/22 1647 02/25/22 2353 02/26/22 0833  BP: 118/72 120/75 110/75 115/75  Pulse: 84 85 98 90  Resp: 20 20 20 20   Temp: (!) 97.5 F (36.4 C) 97.9 F (36.6 C) 98.2 F (36.8 C) 98.1 F (36.7 C)  TempSrc: Oral Oral Oral Oral  SpO2: 100% 100% 100%  100%  Weight:      Height:       General: alert, cooperative, and no distress Lochia: appropriate Uterine Fundus: firm Incision: Healing well with no significant drainage, No significant erythema, Dressing is clean, dry, and intact DVT Evaluation: Negative Homan's sign. No cords or calf tenderness. No significant calf/ankle edema.      02/24/2022    7:30 PM  Edinburgh Postnatal Depression Scale Screening Tool  I have been able to laugh and see the funny side of things. 0  I have looked forward with enjoyment to things. 0  I have blamed myself unnecessarily when things went wrong. 0  I have been anxious or worried for no good reason. 0  I have felt scared or panicky for no good reason. 0  Things have been getting on top of me. 0  I have been so unhappy that I have had difficulty sleeping. 0  I have felt sad or miserable. 0  I have been so unhappy that I have been crying. 0  The thought of harming myself has occurred to me. 0  02/26/2022 Postnatal  Depression Scale Total 0     Labs: Lab Results  Component Value Date   WBC 8.0 02/25/2022   HGB 10.6 (L) 02/25/2022   HCT 31.7 (L) 02/25/2022   MCV 89.5 02/25/2022   PLT 190 02/25/2022     Discharge instruction: per After Visit Summary and "Baby and Me Booklet".  After visit meds:  Allergies as of 02/26/2022       Reactions   Metformin And Related Shortness Of Breath   Sprintec 28 [norgestimate-eth Estradiol] Hives        Medication List     STOP taking these medications    cyclobenzaprine 10 MG tablet Commonly known as: FLEXERIL       TAKE these medications    [facility] Prenatal Gummy 0.4 MG Chew Chew 1 tablet by mouth daily.   docusate sodium 100 MG capsule Commonly known as: COLACE Take 1 capsule (100 mg total) by mouth 2 (two) times daily as needed.   ibuprofen 600 MG tablet Commonly known as: ADVIL Take 1 tablet (600 mg total) by mouth every 6 (six) hours.   oxyCODONE 5 MG immediate release  tablet Commonly known as: Oxy IR/ROXICODONE Take 1 tablet (5 mg total) by mouth every 6 (six) hours as needed for up to 5 days for severe pain.               Discharge Care Instructions  (From admission, onward)           Start     Ordered   02/26/22 0000  Discharge wound care:       Comments: Keep incision dry, clean.   02/26/22 0902            Diet: routine diet  Activity: Advance as tolerated. Pelvic rest for 6 weeks.   Outpatient follow up:1 week incision check; 6 weeks postpartum visit Follow up Appt: Future Appointments  Date Time Provider Department Center  03/03/2022  4:15 PM Hildred Laser, MD [facility] None   Follow up Visit:No follow-ups on file.  Postpartum contraception: Undecided  Newborn Data: Live born female  Birth Weight: 4 lb 7.6 oz (2030 g) APGAR: 8, 9  Newborn Delivery   Birth date/time: 02/24/2022 12:55:00 Delivery type: C-Section, Low Transverse Trial of labor: No C-section categorization: Repeat      Baby Feeding: Formula and Breast Disposition:home with mother   02/26/2022 Tresea Mall, CNM

## 2022-02-25 NOTE — Lactation Note (Addendum)
This note was copied from a baby's chart. Lactation Consultation Note  Patient Name: Girl Raedyn Brum MPNTI'R Date: 02/25/2022   Age:26 hours  0915- Lactation check-in. Mom reports that baby has been sleepy, not eager at the breast or able to sustain latch for a long time. Mom did pump a couple of times, but not consistently overnight based on the feeding plan that was given to her.  LC and mom talked about normal behaviors for babies of Daisy's gestation and size. We discussed muscle tone of the jaw and stamina with breastfeeding- this may not be mature until she is closer to her original due date. We reviewed the feeding plan started yesterday and LC encouraged mom to stick with this plan as this will be able to provide breast milk to [facility] and protect her supply while Toney Reil is working to Probation officer and stamina.  Feeding plan: -Offer breast. IF feeds from breast limit feeding to 10-15 minutes. IF baby does NOT latch, try no more than 5 minutes. -Offer appropriate volumes of supplement- 22kcal formula -Pump for full cycle (15 minutes)  Mom verbalized understanding of feeding plan today.  63- Re visited with mom as baby was in nursery doing the car seat test. Mom reports that baby has stopped latching or will latch a suckle 1-2 times before falling asleep. She has not pumped today: "I've had a lot of visitors, maybe after they leave for tonight I'll start pumping again". LC reiterated the importance of using the pump to help establish and maintain a plentiful milk supply for baby.  Mom verbalizes understanding.  Danford Bad 02/25/2022, 4:21 PM

## 2022-02-25 NOTE — Anesthesia Postprocedure Evaluation (Signed)
Anesthesia Post Note  Patient: [name]  Procedure(s) Performed: CESAREAN SECTION  Patient location during evaluation: Mother Baby Anesthesia Type: Spinal Level of consciousness: oriented and awake and alert Pain management: pain level controlled Vital Signs Assessment: post-procedure vital signs reviewed and stable Respiratory status: spontaneous breathing and respiratory function stable Cardiovascular status: blood pressure returned to baseline and stable Postop Assessment: no headache, no backache, no apparent nausea or vomiting and able to ambulate Anesthetic complications: no   No notable events documented.   Last Vitals:  Vitals:   02/24/22 2330 02/25/22 0230  BP: 121/77 118/78  Pulse: (!) 103 77  Resp: 18 18  Temp: 36.4 C 36.6 C  SpO2: 100% 100%    Last Pain:  Vitals:   02/25/22 0430  TempSrc:   PainSc: 5                  Lynden Oxford

## 2022-02-25 NOTE — Progress Notes (Signed)
Postpartum Day # 1: Cesarean Delivery(repeat)  Subjective: Patient reports incisional pain, tolerating PO, + flatus, and no problems voiding.  Tolerating PO and ambulating without difficulty. Notes she is having difficulties with latching today, however overall feeding is going well.  Objective: Vital signs in last 24 hours: Temp:  [97.5 F (36.4 C)-98.2 F (36.8 C)] 97.5 F (36.4 C) (12/28 0817) Pulse Rate:  [68-103] 84 (12/28 0817) Resp:  [8-22] 20 (12/28 0817) BP: (101-129)/(51-107) 118/72 (12/28 0817) SpO2:  [97 %-100 %] 100 % (12/28 0817)  Physical Exam:  General: alert and no distress Lungs: clear to auscultation bilaterally Breasts: normal appearance, no masses or tenderness Heart: regular rate and rhythm, S1, S2 normal, no murmur, click, rub or gallop Abdomen: soft, non-tender; bowel sounds normal; no masses,  no organomegaly Pelvis: Lochia appropriate, Uterine Fundus firm, Incision: bandage clean/dry/intact. Extremities: DVT Evaluation: No evidence of DVT seen on physical exam. Negative Homan's sign. No cords or calf tenderness. No significant calf/ankle edema.  Recent Labs    02/23/22 1027 02/25/22 0705  HGB 11.6* 10.6*  HCT 35.8* 31.7*    Assessment/Plan: Status post Cesarean section. Doing well postoperatively.  Attempting to breastfeed but also supplementing. Lactation consult as needed.  Regular diet Continue PO pain management Continue current care. Contraception undecided, to discuss further at postpartum visit.     Hildred Laser, MD [facility] at [facility]

## 2022-02-26 LAB — SURGICAL PATHOLOGY

## 2022-02-26 MED ORDER — DOCUSATE SODIUM 100 MG PO CAPS: 100.0000 mg | ORAL_CAPSULE | Freq: Two times a day (BID) | ORAL | 2 refills | Status: DC | PRN

## 2022-02-26 MED ORDER — OXYCODONE HCL 5 MG PO TABS: 5.0000 mg | ORAL_TABLET | Freq: Four times a day (QID) | ORAL | 0 refills | Status: AC | PRN

## 2022-02-26 MED ORDER — OXYCODONE HCL 5 MG PO TABS: 5.0000 mg | ORAL_TABLET | Freq: Four times a day (QID) | ORAL | 0 refills | Status: DC | PRN

## 2022-02-26 MED ORDER — IBUPROFEN 600 MG PO TABS: 600.0000 mg | ORAL_TABLET | Freq: Four times a day (QID) | ORAL | 0 refills | Status: DC

## 2022-02-26 MED ORDER — IBUPROFEN 600 MG PO TABS
600.0000 mg | ORAL_TABLET | Freq: Four times a day (QID) | ORAL | Status: DC
  Administered 2022-02-26: 600 mg via ORAL
  Filled 2022-02-26: qty 1

## 2022-02-26 NOTE — Clinical Social Work Maternal (Signed)
CLINICAL SOCIAL WORK MATERNAL/CHILD NOTE  Patient Details  Name: [name] MRN: [id number] Date of Birth: September 24, 1995  Date:  02/26/2022  Clinical Social Worker Initiating Note:  Robbie Lis RN BSN Case Manager Date/Time: Initiated:  02/26/22/1049     Child's Name:  Jorje Guild Foust   Biological Parents:  Father, Mother   Need for Interpreter:  None   Reason for Referral:  Current Substance Use/Substance Use During Pregnancy     Address:  [address] [place] [address]    Phone number:  [phone] (home)     Additional phone number: NA  Household Members/Support Persons (HM/SP):   Household Member/Support Person 1, Household Member/Support Person 2, Household Member/Support Person 3, Household Member/Support Person 4, Household Member/Support Person 5, Household Member/Support Person 6   HM/SP Name Relationship DOB or Age  HM/SP -1 Ardyth Harps significant other 32  HM/SP -2 Bethann Berkshire mother 91  HM/SP -3 Rigoberto Noel    HM/SP -4 Darlene Ray Grandmother    HM/SP -5 Alan Mulder Foust son 5  HM/SP -6 Melodie Bouillon daughter 4  HM/SP -7        HM/SP -8          Natural Supports (not living in the home):      Professional Supports:     Employment: Futures trader   Type of Work:     Education:  9 to 11 years   Homebound arranged:    Surveyor, quantity Resources:  Medicaid   Other Resources:  Sales executive  , WIC   Cultural/Religious Considerations Which May Impact Care:  NA  Strengths:  Ability to meet basic needs  , Compliance with medical plan  , Home prepared for child  , Pediatrician chosen   Psychotropic Medications:         Pediatrician:    [facility] [employer]  Pediatrician List:   [facility] [place]    [facility] ([facility])  [facility]      Pediatrician Fax Number:    Risk Factors/Current Problems:  Substance Use     Cognitive State:  Alert  , Able to  Concentrate     Mood/Affect:  Interested  , Relaxed     CSW Assessment: TOC consult received for drug screen sent on newborn.  RNCM was able to speak with mother of baby via phone.  Mother and newborn will discharge today, mother reports she and newborn are doing well and she is excited about going home.  Mother of baby was positive for Cannabinoid, she reports that at the beginning of her pregnancy she did smoke marijuana but then switched to CBD gummies 3 to 4 months into her pregnancy to help with appetite and nausea.  She takes 1-2 gummies per day.  Baby's UDS was negative, informed mother of baby that TOC will follow the cord blood results and if they come back positive will make a CPS report as per policy.  Mother verbalized understanding. No other TOC needs identified, mother of baby endorses that they have everything needed at home, car seat, bassinet, clothes, diapers.  Pediatrician will be [facility] in [place].  Father of baby and significant other will be providing transportation home today.     CSW Plan/Description:  CSW Will Continue to Monitor Umbilical Cord Tissue Drug Screen Results and Make Report if Warranted, No Further Intervention Required/No Barriers to Discharge    Catha Nottingham  Ranell Patrick, RN 02/26/2022, 11:01 AM

## 2022-02-26 NOTE — Progress Notes (Signed)
Patient discharged home with family.  Discharge instructions, when to follow up, and prescriptions reviewed with patient.  Patient verbalized understanding. Patient will be escorted out by auxiliary.   

## 2022-02-26 NOTE — Discharge Instructions (Signed)
       Discharge instructions:   Call office if you have any of the following:  headache, visual changes, fever >101.0 F, chills, breast concerns (engorgement, mastitis) excessive vaginal bleeding, incision drainage or problems, leg pain or redness, depression or any other concerns.   Activity: Do not lift > 10 lbs for 6 weeks.  No intercourse or tampons for 6 weeks.  No driving for 1-2 weeks or while taking pain medication. No strenuous activity or heavy lifting for 6 weeks.  No swimming pools, hot tubs or tub baths- showers only.    It is normal to bleed for up to 6 weeks. You should not soak through more than 1 pad in 1 hour.   Continue prenatal vitamin. Increase calories and fluids while breastfeeding.  Your milk will come in, in the next couple of days (right now it is colostrum).  You may have a slight fever when your milk comes in, but it should go away on its own.   If it does not, and rises above 101 F please call the doctor.  You will also feel achy and your breasts will be firm. They will also start to leak.  If you are breastfeeding, continue as you have been and you can pump/express milk for comfort.   For concerns about your baby, please call your pediatrician For breastfeeding concerns, the lactation consultant can be reached at [phone]  Postpartum blues (feelings of happy one minute and sad another minute) are normal for the first few weeks but if it gets worse let your doctor know.   

## 2022-03-03 ENCOUNTER — Other Ambulatory Visit: Payer: Self-pay | Admitting: Obstetrics and Gynecology

## 2022-03-03 ENCOUNTER — Encounter: Payer: Self-pay | Admitting: Obstetrics and Gynecology

## 2022-03-03 ENCOUNTER — Ambulatory Visit (INDEPENDENT_AMBULATORY_CARE_PROVIDER_SITE_OTHER): Payer: Medicaid Other | Admitting: Obstetrics and Gynecology

## 2022-03-03 VITALS — BP 141/90 | HR 88 | Ht 59.0 in | Wt 131.1 lb

## 2022-03-03 DIAGNOSIS — O165 Unspecified maternal hypertension, complicating the puerperium: Secondary | ICD-10-CM

## 2022-03-03 DIAGNOSIS — O9089 Other complications of the puerperium, not elsewhere classified: Secondary | ICD-10-CM | POA: Diagnosis not present

## 2022-03-03 DIAGNOSIS — R519 Headache, unspecified: Secondary | ICD-10-CM | POA: Diagnosis not present

## 2022-03-03 DIAGNOSIS — Z4889 Encounter for other specified surgical aftercare: Secondary | ICD-10-CM

## 2022-03-03 DIAGNOSIS — Z98891 History of uterine scar from previous surgery: Secondary | ICD-10-CM

## 2022-03-03 MED ORDER — BUTALBITAL-APAP-CAFFEINE 50-325-40 MG PO CAPS: 1.0000 | ORAL_CAPSULE | ORAL | 0 refills | Status: DC | PRN

## 2022-03-03 MED ORDER — LABETALOL HCL 100 MG PO TABS: 100.0000 mg | ORAL_TABLET | Freq: Two times a day (BID) | ORAL | 0 refills | Status: DC

## 2022-03-03 NOTE — Progress Notes (Signed)
    OBSTETRICS/GYNECOLOGY POST-OPERATIVE CLINIC VISIT  Subjective:     [name] is a 27 y.o. female who presents to the clinic 1 weeks status post  repeat low-transverse C-section  for  history of prior C-section x 2, IUGR.  Delivery was 37.5 gestational weeks . Eating a regular diet without difficulty. Bowel movements are normal. Pain is controlled with current analgesics. Medications being used: prescription NSAID's including ibuprofen (Motrin) and narcotic analgesics including oxycodone (Oxycontin, Oxyir).    Patient complains of daily headaches, frontal, for the past 4-5 days. Has tried Excedrin, caffeine, and tylenol without relief.  Feels like pressure in her head.  Denies vision changes, RUQ pain. Has been trying to take some iron pills as she was worried it might be related to anemia. Denies worsening of headache with movement or changing positions.   Also reports skin blistering after removal of her initial dressing in the hospital.  Has been using an A&D ointment and a burn cream for this.   The following portions of the patient's history were reviewed and updated as appropriate: allergies, current medications, past family history, past medical history, past social history, past surgical history, and problem list.  Review of Systems Pertinent items noted in HPI and remainder of comprehensive ROS otherwise negative.   Objective:   Initial BP 131/82.  BP (!) 141/90   Pulse 88   Ht 4\' 11"  (1.499 m)   Wt 131 lb 1.6 oz (59.5 kg)   LMP 06/05/2021 (Exact Date)   BMI 26.48 kg/m  Body mass index is 26.48 kg/m.  General:  alert and no distress  Abdomen: soft, bowel sounds active, non-tender  Incision:   healing well, no drainage, no erythema, no hernia, no seroma, no swelling, no dehiscence, incision well approximated.  Several small crusting blisters along side of incision bilaterally where previous tape outline noted .    Pathology:   PLACENTA; CESAREAN SECTION:  -  SINGLETON PLACENTA, 287 G (LESS THAN 3RD PERCENTILE FOR ESTIMATED  GESTATIONAL AGE).  - THREE-VESSEL UMBILICAL CORD WITH ECCENTRIC INSERTION AND NO  SIGNIFICANT HISTOPATHOLOGIC CHANGE; NEGATIVE FOR FUNISITIS AND.  - PLACENTAL MEMBRANES WITH NO SIGNIFICANT HISTOPATHOLOGIC CHANGE  NEGATIVE FOR CHORIOAMNIONITIS AND INCREASED MECONIUM LADEN MACROPHAGES.  - PLACENTAL DISC WITH ACCELERATED VILLOUS MATURATION AND DISTAL VILLOUS  HYPOPLASIA, WITH PATCHY INCREASED SYNCYTIAL KNOTTING (TENNEY PARKER  CHANGE); NEGATIVE FOR VILLITIS, HEMORRHAGE, AND SIGNIFICANT INFARCT  (LESS THAN 5% OF TOTAL DISC SURFACE AREA.    Assessment:   1. Postoperative visit   2. S/P cesarean section   3. Pregnancy headache, postpartum   4. Postpartum hypertension    Plan:   1. Continue any current medications as instructed by provider. 2. Wound care discussed. 3. Pathology report reviewed. 4. Activity restrictions: no bending, stooping, or squatting, no lifting more than 10-15 pounds, and pelvic rest 5. Anticipated return to work: not applicable. 6. Headache postpartum for several days.  BPs borderline elevated. No other symptoms or signs of pre-eclampsia. Will go ahead and treat BPs with low dose of Labetalol. Will also prescribe Fioricet for headaches.  7. Follow up: 1 week for BP check and f/u with headache.  Given strict pre-eclampsia precautions to return sooner if symptoms worsen.  Also will need f/u in 5 weeks for postpartum visit.     [name], MD [facility] at [facility]

## 2022-03-09 ENCOUNTER — Encounter: Payer: Medicaid Other | Admitting: Obstetrics and Gynecology

## 2022-03-16 ENCOUNTER — Telehealth: Payer: Self-pay

## 2022-03-16 NOTE — Telephone Encounter (Signed)
[facility]- Discharge Call Backs 1-Do you have any questions or concerns about yourself as you heal?No  C-Sec-Is your dressing off?Yes.  Incision healing well.  Has f/u with OBGYN soon. 2-Any concerns or questions about your baby?No Is your baby eating, peeing,pooping well?Yes-gaining weight. 3- Reviewed ABC's of safe sleep. 4-How was your stay at the hospital?Good- except for 1 fundal check.  She told the nurse it hurt.  Next time was not as bad.  5-How did our team work together to care for you?Great You should be receiving a survey in the mail soon.   We would really appreciate it if you could fill that out for [place] and return it in the mail.  We value the feedback to make improvements and continue the great work we do.   If you have any questions please feel free to call me back at [phone]

## 2022-03-26 ENCOUNTER — Other Ambulatory Visit: Payer: Self-pay | Admitting: Obstetrics and Gynecology

## 2022-04-06 NOTE — Progress Notes (Deleted)
   OBSTETRICS POSTPARTUM CLINIC PROGRESS NOTE  Subjective:     [name] is a 27 y.o. [phone] female who presents for a postpartum visit. She is 6 weeks postpartum following a low cervical transverse Cesarean section. I have fully reviewed the prenatal and intrapartum course. The delivery was at 37.5 gestational weeks.  Anesthesia: spinal. Postpartum course has been ***. Baby's course has been ***. Baby is feeding by {breast/bottle:69}. Bleeding: patient {HAS HAS YQI:34742} not resumed menses, with Patient's last menstrual period was 06/05/2021 (exact date).. Bowel function is {normal:32111}. Bladder function is {normal:32111}. Patient {is/is not:9024} sexually active. Contraception method desired is {contraceptive method:5051}. Postpartum depression screening: {neg default:13464::"negative"}.  EDPS score is ***.    The following portions of the patient's history were reviewed and updated as appropriate: allergies, current medications, past family history, past medical history, past social history, past surgical history, and problem list.  Review of Systems {ros; complete:30496}   Objective:    LMP 06/05/2021 (Exact Date)   General:  alert and no distress   Breasts:  inspection negative, no nipple discharge or bleeding, no masses or nodularity palpable  Lungs: clear to auscultation bilaterally  Heart:  regular rate and rhythm, S1, S2 normal, no murmur, click, rub or gallop  Abdomen: soft, non-tender; bowel sounds normal; no masses,  no organomegaly.  ***Well healed Pfannenstiel incision   Vulva:  normal  Vagina: normal vagina, no discharge, exudate, lesion, or erythema  Cervix:  no cervical motion tenderness and no lesions  Corpus: normal size, contour, position, consistency, mobility, non-tender  Adnexa:  normal adnexa and no mass, fullness, tenderness  Rectal Exam: Not performed.         Labs:  Lab Results  Component Value Date   HGB 10.6 (L) 02/25/2022      Assessment:   No diagnosis found.   Plan:    1. Contraception: {method:5051} 2. Will check Hgb for h/o postpartum anemia of less than 10.  3. Follow up in: {1-10:13787} {time; units:19136} or as needed.    Chilton Greathouse, [facility]

## 2022-04-07 ENCOUNTER — Ambulatory Visit: Payer: Medicaid Other | Admitting: Obstetrics and Gynecology

## 2022-04-15 ENCOUNTER — Ambulatory Visit (INDEPENDENT_AMBULATORY_CARE_PROVIDER_SITE_OTHER): Payer: Medicaid Other | Admitting: Obstetrics and Gynecology

## 2022-04-15 ENCOUNTER — Encounter: Payer: Self-pay | Admitting: Obstetrics and Gynecology

## 2022-04-15 VITALS — BP 102/66 | HR 79 | Resp 16 | Ht 59.0 in | Wt 135.3 lb

## 2022-04-15 DIAGNOSIS — O165 Unspecified maternal hypertension, complicating the puerperium: Secondary | ICD-10-CM | POA: Insufficient documentation

## 2022-04-15 DIAGNOSIS — Z4889 Encounter for other specified surgical aftercare: Secondary | ICD-10-CM

## 2022-04-15 DIAGNOSIS — Z98891 History of uterine scar from previous surgery: Secondary | ICD-10-CM

## 2022-04-15 HISTORY — DX: Unspecified maternal hypertension, complicating the puerperium: O16.5

## 2022-04-15 NOTE — Patient Instructions (Signed)

## 2022-04-15 NOTE — Progress Notes (Signed)
   OBSTETRICS POSTPARTUM CLINIC PROGRESS NOTE  Subjective:     [name] is a 27 y.o. [phone] female who presents for a postpartum visit. She is 7 weeks postpartum following a low cervical transverse Cesarean section. I have fully reviewed the prenatal and intrapartum course. The delivery was at 37.5 gestational weeks.  Anesthesia: spinal. Postpartum course has been going well. Was complicated by postpartum hypertension after 1 week postpartum check. Stopped BP meds 2 days ago as she ran out of medication. Baby's course has been good. Baby is feeding by bottle - Similac Neosure. Bleeding: patient has not resumed menses, with Patient's last menstrual period was 06/05/2021 (exact date).. Bowel function is normal. Bladder function is normal. Patient is sexually active. Contraception method desired is none. Postpartum depression screening: negative.  EDPS score is 0.    The following portions of the patient's history were reviewed and updated as appropriate: allergies, current medications, past family history, past medical history, past social history, past surgical history, and problem list.  Review of Systems Pertinent items are noted in HPI.   Objective:    BP 102/66   Pulse 79   Resp 16   Ht 4\' 11"  (1.499 m)   Wt 135 lb 4.8 oz (61.4 kg)   LMP 06/05/2021 (Exact Date)   BMI 27.33 kg/m   General:  alert and no distress   Breasts:  inspection negative, no nipple discharge or bleeding, no masses or nodularity palpable  Lungs: clear to auscultation bilaterally  Heart:  regular rate and rhythm, S1, S2 normal, no murmur, click, rub or gallop  Abdomen: soft, non-tender; bowel sounds normal; no masses,  no organomegaly.  Well healed Pfannenstiel incision   Pelvis:  deferred  Rectal Exam: Not performed.         Labs:  Lab Results  Component Value Date   HGB 10.6 (L) 02/25/2022    Assessment:   1. Postoperative visit   2. S/P cesarean section   3. Postpartum hypertension       Plan:   1. Contraception: none.  Notes she is ok if pregnancy occurs again. Advise to place at least 6 months to 1 year between pregnancies due to history of multiple C-sections.  2. Postpartum HTN appears to have resolved, patient has been off her meds for 2 days with normal BPs today.  3. Follow up in:  4-6  months  for annual exam.   [name], MD [place]

## 2022-07-28 ENCOUNTER — Ambulatory Visit (INDEPENDENT_AMBULATORY_CARE_PROVIDER_SITE_OTHER): Payer: Medicaid Other

## 2022-07-28 VITALS — BP 118/80 | HR 87 | Ht 59.0 in | Wt 135.0 lb

## 2022-07-28 DIAGNOSIS — N912 Amenorrhea, unspecified: Secondary | ICD-10-CM

## 2022-07-28 DIAGNOSIS — N926 Irregular menstruation, unspecified: Secondary | ICD-10-CM

## 2022-07-28 DIAGNOSIS — Z3201 Encounter for pregnancy test, result positive: Secondary | ICD-10-CM | POA: Diagnosis not present

## 2022-07-28 LAB — POCT URINE PREGNANCY: Preg Test, Ur: POSITIVE — AB

## 2022-07-28 NOTE — Progress Notes (Signed)
    NURSE VISIT NOTE  Subjective:    Patient ID: [name], female    DOB: 06/01/1995, 27 y.o.   MRN: [id number]  HPI  Patient is a 27 y.o. [id number] female who presents for evaluation of amenorrhea. She believes she could be pregnant. Pregnancy is desired. Sexual Activity: has sex with males. Current symptoms also include: positive home pregnancy test. Last period was abnormal.    Objective:    BP 118/80   Pulse 87   Ht 4\' 11"  (1.499 m)   Wt 135 lb (61.2 kg)   LMP 06/22/2022   BMI 27.27 kg/m   Lab Review  Results for orders placed or performed in visit on 07/28/22  POCT urine pregnancy  Result Value Ref Range   Preg Test, Ur Positive (A) Negative    Assessment:   1. Missed period     Plan:   Pregnancy Test: Positive  Estimated Date of Delivery: 03/29/23 Encouraged well-balanced diet, plenty of rest when needed, pre-natal vitamins daily and walking for exercise.  Discussed self-help for nausea, avoiding OTC medications until consulting provider or pharmacist, other than Tylenol as needed, minimal caffeine (1-2 cups daily) and avoiding alcohol.   She will schedule her nurse visit @ [redacted] wks pregnant, u/s for dating and labs @10  wk, and NOB visit at [redacted] wk pregnant.    Feel free to call with any questions.     Cornelius Moras, CMA

## 2022-07-28 NOTE — Patient Instructions (Signed)

## 2022-08-11 ENCOUNTER — Ambulatory Visit (INDEPENDENT_AMBULATORY_CARE_PROVIDER_SITE_OTHER): Payer: Medicaid Other

## 2022-08-11 VITALS — Wt 135.0 lb

## 2022-08-11 DIAGNOSIS — Z369 Encounter for antenatal screening, unspecified: Secondary | ICD-10-CM

## 2022-08-11 DIAGNOSIS — Z3480 Encounter for supervision of other normal pregnancy, unspecified trimester: Secondary | ICD-10-CM

## 2022-08-11 DIAGNOSIS — Z3689 Encounter for other specified antenatal screening: Secondary | ICD-10-CM

## 2022-08-11 DIAGNOSIS — O0990 Supervision of high risk pregnancy, unspecified, unspecified trimester: Secondary | ICD-10-CM | POA: Insufficient documentation

## 2022-08-11 NOTE — Progress Notes (Signed)
With the new protocol, we are waiting on the Dating Scan before we schedule with a provider for the NOB exam.

## 2022-08-11 NOTE — Patient Instructions (Signed)
First Trimester of Pregnancy  The first trimester of pregnancy starts on the first day of your last menstrual period until the end of week 12. This is also called months 1 through 3 of pregnancy. Body changes during your first trimester Your body goes through many changes during pregnancy. The changes usually return to normal after your baby is born. Physical changes You may gain or lose weight. Your breasts may grow larger and hurt. The area around your nipples may get darker. Dark spots or blotches may develop on your face. You may have changes in your hair. Health changes You may feel like you might vomit (nauseous), and you may vomit. You may have heartburn. You may have headaches. You may have trouble pooping (constipation). Your gums may bleed. Other changes You may get tired easily. You may pee (urinate) more often. Your menstrual periods will stop. You may not feel hungry. You may want to eat certain kinds of food. You may have changes in your emotions from day to day. You may have more dreams. Follow these instructions at home: Medicines Take over-the-counter and prescription medicines only as told by your doctor. Some medicines are not safe during pregnancy. Take a prenatal vitamin that contains at least 600 micrograms (mcg) of folic acid. Eating and drinking Eat healthy meals that include: Fresh fruits and vegetables. Whole grains. Good sources of protein, such as meat, eggs, or tofu. Low-fat dairy products. Avoid raw meat and unpasteurized juice, milk, and cheese. If you feel like you may vomit, or you vomit: Eat 4 or 5 small meals a day instead of 3 large meals. Try eating a few soda crackers. Drink liquids between meals instead of during meals. You may need to take these actions to prevent or treat trouble pooping: Drink enough fluids to keep your pee (urine) pale yellow. Eat foods that are high in fiber. These include beans, whole grains, and fresh fruits and  vegetables. Limit foods that are high in fat and sugar. These include fried or sweet foods. Activity Exercise only as told by your doctor. Most people can do their usual exercise routine during pregnancy. Stop exercising if you have cramps or pain in your lower belly (abdomen) or low back. Do not exercise if it is too hot or too humid, or if you are in a place of great height (high altitude). Avoid heavy lifting. If you choose to, you may have sex unless your doctor tells you not to. Relieving pain and discomfort Wear a good support bra if your breasts are sore. Rest with your legs raised (elevated) if you have leg cramps or low back pain. If you have bulging veins (varicose veins) in your legs: Wear support hose as told by your doctor. Raise your feet for 15 minutes, 3-4 times a day. Limit salt in your food. Safety Wear your seat belt at all times when you are in a car. Talk with your doctor if someone is hurting you or yelling at you. Talk with your doctor if you are feeling sad or have thoughts of hurting yourself. Lifestyle Do not use hot tubs, steam rooms, or saunas. Do not douche. Do not use tampons or scented sanitary pads. Do not use herbal medicines, illegal drugs, or medicines that are not approved by your doctor. Do not drink alcohol. Do not smoke or use any products that contain nicotine or tobacco. If you need help quitting, ask your doctor. Avoid cat litter boxes and soil that is used by cats. These carry   germs that can cause harm to the baby and can cause a loss of your baby by miscarriage or stillbirth. General instructions Keep all follow-up visits. This is important. Ask for help if you need counseling or if you need help with nutrition. Your doctor can give you advice or tell you where to go for help. Visit your dentist. At home, brush your teeth with a soft toothbrush. Floss gently. Write down your questions. Take them to your prenatal visits. Where to find more  information American Pregnancy Association: americanpregnancy.org American College of Obstetricians and Gynecologists: www.acog.org Office on Women's Health: womenshealth.gov/pregnancy Contact a doctor if: You are dizzy. You have a fever. You have mild cramps or pressure in your lower belly. You have a nagging pain in your belly area. You continue to feel like you may vomit, you vomit, or you have watery poop (diarrhea) for 24 hours or longer. You have a bad-smelling fluid coming from your vagina. You have pain when you pee. You are exposed to a disease that spreads from person to person, such as chickenpox, measles, Zika virus, HIV, or hepatitis. Get help right away if: You have spotting or bleeding from your vagina. You have very bad belly cramping or pain. You have shortness of breath or chest pain. You have any kind of injury, such as from a fall or a car crash. You have new or increased pain, swelling, or redness in an arm or leg. Summary The first trimester of pregnancy starts on the first day of your last menstrual period until the end of week 12 (months 1 through 3). Eat 4 or 5 small meals a day instead of 3 large meals. Do not smoke or use any products that contain nicotine or tobacco. If you need help quitting, ask your doctor. Keep all follow-up visits. This information is not intended to replace advice given to you by your health care provider. Make sure you discuss any questions you have with your health care provider. Document Revised: 07/25/2019 Document Reviewed: 05/31/2019 Elsevier Patient Education  2024 Elsevier Inc. Commonly Asked Questions During Pregnancy  Cats: A parasite can be excreted in cat feces.  To avoid exposure you need to have another person empty the little box.  If you must empty the litter box you will need to wear gloves.  Wash your hands after handling your cat.  This parasite can also be found in raw or undercooked meat so this should also be  avoided.  Colds, Sore Throats, Flu: Please check your medication sheet to see what you can take for symptoms.  If your symptoms are unrelieved by these medications please call the office.  Dental Work: Most any dental work your dentist recommends is permitted.  X-rays should only be taken during the first trimester if absolutely necessary.  Your abdomen should be shielded with a lead apron during all x-rays.  Please notify your provider prior to receiving any x-rays.  Novocaine is fine; gas is not recommended.  If your dentist requires a note from us prior to dental work please call the office and we will provide one for you.  Exercise: Exercise is an important part of staying healthy during your pregnancy.  You may continue most exercises you were accustomed to prior to pregnancy.  Later in your pregnancy you will most likely notice you have difficulty with activities requiring balance like riding a bicycle.  It is important that you listen to your body and avoid activities that put you at a higher   risk of falling.  Adequate rest and staying well hydrated are a must!  If you have questions about the safety of specific activities ask your provider.    Exposure to Children with illness: Try to avoid obvious exposure; report any symptoms to us when noted,  If you have chicken pos, red measles or mumps, you should be immune to these diseases.   Please do not take any vaccines while pregnant unless you have checked with your OB provider.  Fetal Movement: After 28 weeks we recommend you do "kick counts" twice daily.  Lie or sit down in a calm quiet environment and count your baby movements "kicks".  You should feel your baby at least 10 times per hour.  If you have not felt 10 kicks within the first hour get up, walk around and have something sweet to eat or drink then repeat for an additional hour.  If count remains less than 10 per hour notify your provider.  Fumigating: Follow your pest control agent's  advice as to how long to stay out of your home.  Ventilate the area well before re-entering.  Hemorrhoids:   Most over-the-counter preparations can be used during pregnancy.  Check your medication to see what is safe to use.  It is important to use a stool softener or fiber in your diet and to drink lots of liquids.  If hemorrhoids seem to be getting worse please call the office.   Hot Tubs:  Hot tubs Jacuzzis and saunas are not recommended while pregnant.  These increase your internal body temperature and should be avoided.  Intercourse:  Sexual intercourse is safe during pregnancy as long as you are comfortable, unless otherwise advised by your provider.  Spotting may occur after intercourse; report any bright red bleeding that is heavier than spotting.  Labor:  If you know that you are in labor, please go to the hospital.  If you are unsure, please call the office and let us help you decide what to do.  Lifting, straining, etc:  If your job requires heavy lifting or straining please check with your provider for any limitations.  Generally, you should not lift items heavier than that you can lift simply with your hands and arms (no back muscles)  Painting:  Paint fumes do not harm your pregnancy, but may make you ill and should be avoided if possible.  Latex or water based paints have less odor than oils.  Use adequate ventilation while painting.  Permanents & Hair Color:  Chemicals in hair dyes are not recommended as they cause increase hair dryness which can increase hair loss during pregnancy.  " Highlighting" and permanents are allowed.  Dye may be absorbed differently and permanents may not hold as well during pregnancy.  Sunbathing:  Use a sunscreen, as skin burns easily during pregnancy.  Drink plenty of fluids; avoid over heating.  Tanning Beds:  Because their possible side effects are still unknown, tanning beds are not recommended.  Ultrasound Scans:  Routine ultrasounds are performed  at approximately 20 weeks.  You will be able to see your baby's general anatomy an if you would like to know the gender this can usually be determined as well.  If it is questionable when you conceived you may also receive an ultrasound early in your pregnancy for dating purposes.  Otherwise ultrasound exams are not routinely performed unless there is a medical necessity.  Although you can request a scan we ask that you pay for it when   conducted because insurance does not cover " patient request" scans.  Work: If your pregnancy proceeds without complications you may work until your due date, unless your physician or employer advises otherwise.  Round Ligament Pain/Pelvic Discomfort:  Sharp, shooting pains not associated with bleeding are fairly common, usually occurring in the second trimester of pregnancy.  They tend to be worse when standing up or when you remain standing for long periods of time.  These are the result of pressure of certain pelvic ligaments called "round ligaments".  Rest, Tylenol and heat seem to be the most effective relief.  As the womb and fetus grow, they rise out of the pelvis and the discomfort improves.  Please notify the office if your pain seems different than that described.  It may represent a more serious condition.  Common Medications Safe in Pregnancy  Acne:      Constipation:  Benzoyl Peroxide     Colace  Clindamycin      Dulcolax Suppository  Topica Erythromycin     Fibercon  Salicylic Acid      Metamucil         Miralax AVOID:        Senakot   Accutane    Cough:  Retin-A       Cough Drops  Tetracycline      Phenergan w/ Codeine if Rx  Minocycline      Robitussin (Plain & DM)  Antibiotics:     Crabs/Lice:  Ceclor       RID  Cephalosporins    AVOID:  E-Mycins      Kwell  Keflex  Macrobid/Macrodantin   Diarrhea:  Penicillin      Kao-Pectate  Zithromax      Imodium AD         PUSH FLUIDS AVOID:       Cipro     Fever:  Tetracycline      Tylenol (Regular  or Extra  Minocycline       Strength)  Levaquin      Extra Strength-Do not          Exceed 8 tabs/24 hrs Caffeine:        <200mg/day (equiv. To 1 cup of coffee or  approx. 3 12 oz sodas)         Gas: Cold/Hayfever:       Gas-X  Benadryl      Mylicon  Claritin       Phazyme  **Claritin-D        Chlor-Trimeton    Headaches:  Dimetapp      ASA-Free Excedrin  Drixoral-Non-Drowsy     Cold Compress  Mucinex (Guaifenasin)     Tylenol (Regular or Extra  Sudafed/Sudafed-12 Hour     Strength)  **Sudafed PE Pseudoephedrine   Tylenol Cold & Sinus     Vicks Vapor Rub  Zyrtec  **AVOID if Problems With Blood Pressure         Heartburn: Avoid lying down for at least 1 hour after meals  Aciphex      Maalox     Rash:  Milk of Magnesia     Benadryl    Mylanta       1% Hydrocortisone Cream  Pepcid  Pepcid Complete   Sleep Aids:  Prevacid      Ambien   Prilosec       Benadryl  Rolaids       Chamomile Tea  Tums (Limit 4/day)     Unisom           Tylenol PM         Warm milk-add vanilla or  Hemorrhoids:       Sugar for taste  Anusol/Anusol H.C.  (RX: Analapram 2.5%)  Sugar Substitutes:  Hydrocortisone OTC     Ok in moderation  Preparation H      Tucks        Vaseline lotion applied to tissue with wiping    Herpes:     Throat:  Acyclovir      Oragel  Famvir  Valtrex     Vaccines:         Flu Shot Leg Cramps:       *Gardasil  Benadryl      Hepatitis A         Hepatitis B Nasal Spray:       Pneumovax  Saline Nasal Spray     Polio Booster         Tetanus Nausea:       Tuberculosis test or PPD  Vitamin B6 25 mg TID   AVOID:    Dramamine      *Gardasil  Emetrol       Live Poliovirus  Ginger Root 250 mg QID    MMR (measles, mumps &  High Complex Carbs @ Bedtime    rebella)  Sea Bands-Accupressure    Varicella (Chickenpox)  Unisom 1/2 tab TID     *No known complications           If received before Pain:         Known pregnancy;   Darvocet       Resume series  after  Lortab        Delivery  Percocet    Yeast:   Tramadol      Femstat  Tylenol 3      Gyne-lotrimin  Ultram       Monistat  Vicodin           MISC:         All Sunscreens           Hair Coloring/highlights          Insect Repellant's          (Including DEET)         Mystic Tans  

## 2022-08-11 NOTE — Progress Notes (Signed)
New OB Intake  I connected with  [name] on 08/11/22 at  8:15 AM EDT by telephone and verified that I am speaking with the correct person using two identifiers. Nurse is located at [facility] and pt is located at home.  I explained I am completing New OB Intake today. We discussed her EDD of 03/29/2023 that is based on LMP of 06/22/2022. Pt is G7/P3033. I reviewed her allergies, medications, Medical/Surgical/OB history, and appropriate screenings. There are no cats in the home. Based on history, this is a/an pregnancy uncomplicated .   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  Anatomy US Explained first scheduled [place] will be done 09/09/22 and an anatomy scan will be done at 20 weeks.  Labs Discussed genetic screening with patient. Patient desires genetic testing to be drawn with new OB visit. Discussed possible labs to be drawn at new OB appointment.  COVID Vaccine Patient has not had COVID vaccine.   Social Determinants of Health Food Insecurity: denies food insecurity Transportation: Patient denies transportation needs. Childcare: Discussed no children allowed at ultrasound appointments.   First visit review I reviewed new OB appt with pt. I explained she will have ob bloodwork and pap smear/pelvic exam if indicated. Explained pt will be seen by Paula Compton, CNM at first visit; encounter routed to appropriate provider.   Loran Senters, [facility] 08/11/2022  8:43 AM

## 2022-09-07 ENCOUNTER — Telehealth: Payer: Self-pay | Admitting: Obstetrics and Gynecology

## 2022-09-07 NOTE — Telephone Encounter (Signed)
The patient was scheduled for 7/11 for ultrasound at our office the ultrasound tech is out of the office. We have rescheduled the appointment for Thursday, 7/11 at 8 am arrival at 7:30 am at [facility]. The patient will need 1 hour prior to drink 32 oz slowly and do not void until ultrasound has been completed. I left generic message for the patient to contact our office about 7/11 appointment.

## 2022-09-07 NOTE — Telephone Encounter (Signed)
I attempt times x2 , no answer, no message left.

## 2022-09-07 NOTE — Telephone Encounter (Signed)
The patient called and confirmed new location, time for ultrasound appointment. She is aware to have full bladder. Patient also aware to check my chart for information.

## 2022-09-09 ENCOUNTER — Ambulatory Visit
Admission: RE | Admit: 2022-09-09 | Discharge: 2022-09-09 | Disposition: A | Discharge status: Home / Self Care | Payer: Medicaid Other | Source: Ambulatory Visit | Attending: Obstetrics | Admitting: Obstetrics

## 2022-09-09 ENCOUNTER — Other Ambulatory Visit: Payer: Medicaid Other

## 2022-09-09 DIAGNOSIS — Z369 Encounter for antenatal screening, unspecified: Secondary | ICD-10-CM | POA: Diagnosis not present

## 2022-09-09 DIAGNOSIS — Z3687 Encounter for antenatal screening for uncertain dates: Secondary | ICD-10-CM | POA: Insufficient documentation

## 2022-09-09 DIAGNOSIS — Z3480 Encounter for supervision of other normal pregnancy, unspecified trimester: Secondary | ICD-10-CM | POA: Insufficient documentation

## 2022-09-09 DIAGNOSIS — Z3A11 11 weeks gestation of pregnancy: Secondary | ICD-10-CM | POA: Insufficient documentation

## 2022-09-30 NOTE — Progress Notes (Signed)
OBSTETRIC INITIAL PRENATAL VISIT  Subjective:    [name] is being seen today for her first obstetrical visit.  This is a planned pregnancy. She is a 27 y.o. [phone] female at [redacted]w[redacted]d gestation, Estimated Date of Delivery: 03/29/23 with Patient's last menstrual period was 06/22/2022 (exact date).,  consistent with 11 week sono. Her obstetrical history is significant for intrauterine growth restriction (IUGR) and postpartum HTN in last pregnancy, history of gestational HTN in a prior pregnancy. Also with short interval pregnancy spacing. Relationship with FOB: spouse, living together. Patient does not intend to breast feed. Pregnancy history fully reviewed.    OB History  Gravida Para Term Preterm AB Living  7 3 3  0 3 3  SAB IAB Ectopic Multiple Live Births  3 0 0 0 3    # Outcome Date GA Lbr Len/2nd Weight Sex Type Anes PTL Lv  7 Current           6 Term 02/24/22 [redacted]w[redacted]d  4 lb 7.6 oz (2.03 kg) F CS-LTranv Spinal  LIV     Name: Uhlir,GIRL Asna     Apgar1: 8  Apgar5: 9  5 Term 12/30/17 [redacted]w[redacted]d  6 lb 11.2 oz (3.04 kg) F CS-LTranv Spinal  LIV     Name: Calandro,GIRL Adonis     Apgar1: 8  Apgar5: 9  4 Term 11/29/16 [redacted]w[redacted]d  6 lb 10.5 oz (3.02 kg) M CS-LTranv EPI  LIV     Name: Nissley,PENDINGBABY     Apgar1: 8  Apgar5: 9  3 SAB 2015        ND  2 SAB 2012        ND  1 SAB      SAB       Gynecologic History:  Last pap smear was 09/08/2020.  Results were Normal.  denies h/o abnormal pap smears in the past.  denies history of STIs.  Contraception prior to conception: None   Past Medical History:  Diagnosis Date   Abdominal pain    Anxiety    Asthma    Depression    Hypertension    during pregancy   Nausea    Peptic ulcer disease    PONV (postoperative nausea and vomiting)    Pre-diabetes (history)    better with weight loss    Family History  Problem Relation Age of Onset   Cholelithiasis Mother    Other Mother        myasthenia gravis, enlarged heart  colapsed lung, restless legs   Anemia Mother    Hypertension Father    Healthy Sister    Healthy Sister    Healthy Sister    Healthy Sister    SIDS Brother    Healthy Brother    Cholelithiasis Maternal Grandmother    Ulcers Maternal Grandmother    Epilepsy Maternal Grandmother    Diabetes Maternal Grandfather    Heart Problems Maternal Grandfather        has pacemaker   Other Paternal Grandmother        back issues   Celiac disease Neg Hx     Past Surgical History:  Procedure Laterality Date   CESAREAN SECTION N/A 11/29/2016   Procedure: CESAREAN SECTION;  Surgeon: Defrancesco, Prentice Docker, MD;  Location: [facility];  Service: Obstetrics;  Laterality: N/A;   CESAREAN SECTION N/A 12/30/2017   Procedure: REPEAT CESAREAN SECTION;  Surgeon: Hildred Laser, MD;  Location: [facility];  Service: Obstetrics;  Laterality: N/A;   CESAREAN SECTION N/A  02/24/2022   Procedure: CESAREAN SECTION;  Surgeon: Hildred Laser, MD;  Location: [facility];  Service: Obstetrics;  Laterality: N/A;   CHOLECYSTECTOMY     TONSILLECTOMY     WISDOM TOOTH EXTRACTION      Social History   Socioeconomic History   Marital status: Married    Spouse name: Molli Hazard   Number of children: 3   Years of education: 12   Highest education level: GED or equivalent  Occupational History   Occupation: unemployed  Tobacco Use   Smoking status: Former    Current packs/day: 0.50    Types: Cigarettes   Smokeless tobacco: Never  Vaping Use   Vaping status: Never Used  Substance and Sexual Activity   Alcohol use: No   Drug use: Not Currently    Comment: Denies current use    Sexual activity: Yes    Partners: Male    Birth control/protection: None    Comment: Nexplanon   Other Topics Concern   Not on file  Social History Narrative   Lives with her kids and parents   Right Handed   Drinks 1-2 cups caffeine daily   Social Determinants of Health   Financial Resource Strain: Low Risk  (08/11/2022)   Overall Financial  Resource Strain (CARDIA)    Difficulty of Paying Living Expenses: Not very hard  Food Insecurity: No Food Insecurity (08/11/2022)   Hunger Vital Sign    Worried About Running Out of Food in the Last Year: Never true    Ran Out of Food in the Last Year: Never true  Transportation Needs: No Transportation Needs (08/11/2022)   PRAPARE - Administrator, Civil Service (Medical): No    Lack of Transportation (Non-Medical): No  Physical Activity: Inactive (08/11/2022)   Exercise Vital Sign    Days of Exercise per Week: 0 days    Minutes of Exercise per Session: 0 min  Stress: No Stress Concern Present (08/11/2022)   [employer] of Occupational Health - Occupational Stress Questionnaire    Feeling of Stress : Not at all  Social Connections: Moderately Integrated (08/11/2022)   Social Connection and Isolation Panel [NHANES]    Frequency of Communication with Friends and Family: More than three times a week    Frequency of Social Gatherings with Friends and Family: Twice a week    Attends Religious Services: More than 4 times per year    Active Member of [employer] or Organizations: No    Attends Banker Meetings: Never    Marital Status: Married  Catering manager Violence: Not At Risk (08/11/2022)   Humiliation, Afraid, Rape, and Kick questionnaire    Fear of Current or Ex-Partner: No    Emotionally Abused: No    Physically Abused: No    Sexually Abused: No    Current Outpatient Medications on File Prior to Visit  Medication Sig Dispense Refill   Butalbital-APAP-Caffeine 50-325-40 MG capsule Take 1-2 capsules by mouth every 4 (four) hours as needed for headache. (Patient not taking: Reported on 07/28/2022) 30 capsule 0   labetalol (NORMODYNE) 100 MG tablet TAKE 1 TABLET BY MOUTH TWICE A DAY (Patient not taking: Reported on 07/28/2022) 180 tablet 1   prenatal vitamin w/FE, FA (NATACHEW) 29-1 MG CHEW chewable tablet Chew 2 tablets by mouth daily at 12 noon.      [DISCONTINUED] lansoprazole (PREVACID) 30 MG capsule TAKE 1 CAPSULE (30 MG TOTAL) BY MOUTH DAILY AT 12 NOON. 90 capsule 1   No current facility-administered  medications on file prior to visit.    Allergies  Allergen Reactions   Latex Other (See Comments)    Burns her skin   Metformin And Related Shortness Of Breath   Sprintec 28 [Norgestimate-Eth Estradiol] Hives     Review of Systems General: Not Present- Fever, Weight Loss and Weight Gain. Skin: Not Present- Rash. HEENT: Not Present- Blurred Vision, Headache and Bleeding Gums. Respiratory: Not Present- Difficulty Breathing. Breast: Not Present- Breast Mass. Cardiovascular: Not Present- Chest Pain, Elevated Blood Pressure, Fainting / Blacking Out and Shortness of Breath. Gastrointestinal: Not Present- Abdominal Pain, Constipation. Present - Nausea and Vomiting. Female Genitourinary: Not Present- Frequency, Painful Urination, Pelvic Pain, Vaginal Bleeding, Vaginal Discharge, Contractions, regular, Fetal Movements Decreased, Urinary Complaints and Vaginal Fluid. Musculoskeletal: Not Present- Back Pain and Leg Cramps. Neurological: Not Present- Dizziness. Psychiatric: Not Present- Depression.     Objective:   Blood pressure (!) 90/57, pulse 89, weight 129 lb 9.6 oz (58.8 kg), last menstrual period 06/22/2022, unknown if currently breastfeeding.  Body mass index is 26.18 kg/m.  General Appearance:    Alert, cooperative, no distress, appears stated age  Head:    Normocephalic, without obvious abnormality, atraumatic  Eyes:    PERRL, conjunctiva/corneas clear, EOM's intact, both eyes  Ears:    Normal external ear canals, both ears  Nose:   Nares normal, septum midline, mucosa normal, no drainage or sinus tenderness  Throat:   Lips, mucosa, and tongue normal; poor dentition.   Neck:   Supple, symmetrical, trachea midline, no adenopathy; thyroid: no enlargement/tenderness/nodules; no carotid bruit or JVD  Back:     Symmetric, no  curvature, ROM normal, no CVA tenderness  Lungs:     Clear to auscultation bilaterally, respirations unlabored  Chest Wall:    No tenderness or deformity   Heart:    Regular rate and rhythm, S1 and S2 normal, no murmur, rub or gallop  Breast Exam:    No tenderness, masses, or nipple abnormality  Abdomen:     Soft, non-tender, bowel sounds active all four quadrants, no masses, no organomegaly.  FHT 162  bpm.  Genitalia:    Pelvic:external genitalia normal, vagina without lesions, discharge, or tenderness, rectovaginal septum  normal. Cervix normal in appearance, no cervical motion tenderness, no adnexal masses or tenderness.  Pregnancy positive findings: uterine enlargement: 14 wk size, nontender.   Rectal:    Normal external sphincter.  No hemorrhoids appreciated. Internal exam not done.   Extremities:   Extremities normal, atraumatic, no cyanosis or edema  Pulses:   2+ and symmetric all extremities  Skin:   Skin color, texture, turgor normal, no rashes or lesions  Lymph nodes:   Cervical, supraclavicular, and axillary nodes normal  Neurologic:   CNII-XII intact, normal strength, sensation and reflexes throughout     Assessment:   1. Supervision of high risk pregnancy in second trimester   2. [redacted] weeks gestation of pregnancy   3. Encounter for drug screening   4. Encounter for tobacco use screening   5. Exposure to cat feces, initial encounter   6. Screen for STD (sexually transmitted disease)   7. Genetic screening   8. Encounter for fetal anatomic survey   9. History of 3 cesarean sections   10. History of IUGR (intrauterine growth retardation) and stillbirth, currently pregnant, second trimester   83. History of gestational hypertension   12. Short interval between pregnancies affecting pregnancy, antepartum   13. Nausea and vomiting during pregnancy prior to [redacted] weeks gestation  Plan:   Supervision of high risk pregnancy in second trimester - Initial labs reviewed. - Prenatal  vitamins encouraged. - Problem list reviewed and updated. - New OB counseling:  The patient has been given an overview regarding routine prenatal care.  Recommendations regarding diet, weight gain, and exercise in pregnancy were given. - Prenatal testing, optional genetic testing, and ultrasound use in pregnancy were reviewed.  Traditional genetic screening vs cell-fee DNA genetic screening discussed, including risks and benefits. Panorama testing ordered. - Benefits of Breast Feeding were discussed. The patient is encouraged to consider nursing her baby post partum.   2. Encounter for tobacco use screening - Patient is a former smoker - Nicotine screen, urine  3. Encounter for fetal anatomic survey - To be performed next visit - US OB Comp + 14 Wk; Future  4. History of 3 cesarean sections - Patient will be scheduled for repeat C-section at 39 weeks unless otherwise medically indicated  5. History of IUGR (intrauterine growth retardation) and stillbirth, currently pregnant, second trimester - Will follow up growth this pregnancy as indicated by ultrasounds and fundal height  6. History of gestational hypertension - History of GHTN in a prior pregnancy. H/o postpartum HTN with last pregnancy. Will monitor BPs closely this pregnancy as patient is at risk to develop again.  - Recommend daily baby aspirin 81 mg.   7. Short interval between pregnancies affecting pregnancy, antepartum - Patient at increased risk for pregnancy with complications such as preterm delivery, SGA or IUGR.    8.  Nausea and vomiting prior to [redacted] weeks gestation - Patient requesting medication for management. Notes Zofran typically works for her. Will prescribe Diclegis, and also short supply for Zofran for any refractory nausea/vomiting.    Follow up in 4 weeks.    Hildred Laser, MD [facility]

## 2022-10-01 ENCOUNTER — Telehealth: Payer: Self-pay

## 2022-10-01 ENCOUNTER — Encounter: Payer: Self-pay | Admitting: Obstetrics and Gynecology

## 2022-10-01 ENCOUNTER — Ambulatory Visit (INDEPENDENT_AMBULATORY_CARE_PROVIDER_SITE_OTHER): Payer: Medicaid Other | Admitting: Obstetrics and Gynecology

## 2022-10-01 ENCOUNTER — Other Ambulatory Visit (HOSPITAL_COMMUNITY)
Admission: RE | Admit: 2022-10-01 | Discharge: 2022-10-01 | Disposition: A | Discharge status: Home / Self Care | Payer: Medicaid Other | Source: Ambulatory Visit | Attending: Obstetrics and Gynecology | Admitting: Obstetrics and Gynecology

## 2022-10-01 VITALS — BP 90/57 | HR 89 | Ht 59.0 in | Wt 129.6 lb

## 2022-10-01 DIAGNOSIS — Z3482 Encounter for supervision of other normal pregnancy, second trimester: Secondary | ICD-10-CM | POA: Diagnosis present

## 2022-10-01 DIAGNOSIS — Z113 Encounter for screening for infections with a predominantly sexual mode of transmission: Secondary | ICD-10-CM | POA: Insufficient documentation

## 2022-10-01 DIAGNOSIS — O09899 Supervision of other high risk pregnancies, unspecified trimester: Secondary | ICD-10-CM | POA: Insufficient documentation

## 2022-10-01 DIAGNOSIS — O09291 Supervision of pregnancy with other poor reproductive or obstetric history, first trimester: Secondary | ICD-10-CM | POA: Diagnosis not present

## 2022-10-01 DIAGNOSIS — T7589XA Other specified effects of external causes, initial encounter: Secondary | ICD-10-CM

## 2022-10-01 DIAGNOSIS — O09292 Supervision of pregnancy with other poor reproductive or obstetric history, second trimester: Secondary | ICD-10-CM

## 2022-10-01 DIAGNOSIS — Z8759 Personal history of other complications of pregnancy, childbirth and the puerperium: Secondary | ICD-10-CM | POA: Insufficient documentation

## 2022-10-01 DIAGNOSIS — O34219 Maternal care for unspecified type scar from previous cesarean delivery: Secondary | ICD-10-CM

## 2022-10-01 DIAGNOSIS — Z0283 Encounter for blood-alcohol and blood-drug test: Secondary | ICD-10-CM

## 2022-10-01 DIAGNOSIS — Z3A14 14 weeks gestation of pregnancy: Secondary | ICD-10-CM

## 2022-10-01 DIAGNOSIS — O219 Vomiting of pregnancy, unspecified: Secondary | ICD-10-CM

## 2022-10-01 DIAGNOSIS — Z0189 Encounter for other specified special examinations: Secondary | ICD-10-CM

## 2022-10-01 DIAGNOSIS — Z3689 Encounter for other specified antenatal screening: Secondary | ICD-10-CM

## 2022-10-01 DIAGNOSIS — Z1379 Encounter for other screening for genetic and chromosomal anomalies: Secondary | ICD-10-CM

## 2022-10-01 DIAGNOSIS — O0992 Supervision of high risk pregnancy, unspecified, second trimester: Secondary | ICD-10-CM

## 2022-10-01 DIAGNOSIS — Z98891 History of uterine scar from previous surgery: Secondary | ICD-10-CM

## 2022-10-01 MED ORDER — ONDANSETRON 4 MG PO TBDP: 4.0000 mg | ORAL_TABLET | Freq: Four times a day (QID) | ORAL | 2 refills | Status: DC | PRN

## 2022-10-01 MED ORDER — BONJESTA 20-20 MG PO TBCR: 1.0000 | EXTENDED_RELEASE_TABLET | Freq: Two times a day (BID) | ORAL | 3 refills | Status: DC

## 2022-10-01 NOTE — Patient Instructions (Signed)

## 2022-10-04 NOTE — Telephone Encounter (Signed)
PA has been started

## 2022-10-04 NOTE — Telephone Encounter (Signed)
PA completed and Approved

## 2022-10-29 ENCOUNTER — Encounter: Payer: Medicaid Other | Admitting: Licensed Practical Nurse

## 2022-10-29 ENCOUNTER — Ambulatory Visit (INDEPENDENT_AMBULATORY_CARE_PROVIDER_SITE_OTHER): Payer: Medicaid Other

## 2022-10-29 DIAGNOSIS — Z3689 Encounter for other specified antenatal screening: Secondary | ICD-10-CM | POA: Diagnosis not present

## 2022-10-29 DIAGNOSIS — Z3A18 18 weeks gestation of pregnancy: Secondary | ICD-10-CM | POA: Diagnosis not present

## 2022-11-10 ENCOUNTER — Encounter: Payer: Self-pay | Admitting: Obstetrics and Gynecology

## 2022-11-10 ENCOUNTER — Ambulatory Visit (INDEPENDENT_AMBULATORY_CARE_PROVIDER_SITE_OTHER): Payer: Medicaid Other | Admitting: Obstetrics and Gynecology

## 2022-11-10 VITALS — BP 104/59 | HR 81 | Wt 131.8 lb

## 2022-11-10 DIAGNOSIS — O09899 Supervision of other high risk pregnancies, unspecified trimester: Secondary | ICD-10-CM

## 2022-11-10 DIAGNOSIS — Z362 Encounter for other antenatal screening follow-up: Secondary | ICD-10-CM

## 2022-11-10 DIAGNOSIS — R825 Elevated urine levels of drugs, medicaments and biological substances: Secondary | ICD-10-CM

## 2022-11-10 DIAGNOSIS — Z3A20 20 weeks gestation of pregnancy: Secondary | ICD-10-CM

## 2022-11-10 DIAGNOSIS — O09292 Supervision of pregnancy with other poor reproductive or obstetric history, second trimester: Secondary | ICD-10-CM

## 2022-11-10 DIAGNOSIS — O0992 Supervision of high risk pregnancy, unspecified, second trimester: Secondary | ICD-10-CM

## 2022-11-10 DIAGNOSIS — Z98891 History of uterine scar from previous surgery: Secondary | ICD-10-CM

## 2022-11-10 LAB — POCT URINALYSIS DIPSTICK OB
Bilirubin, UA: NEGATIVE
Blood, UA: NEGATIVE
Glucose, UA: NEGATIVE
Ketones, UA: NEGATIVE
Leukocytes, UA: NEGATIVE
Nitrite, UA: NEGATIVE
Spec Grav, UA: 1.02 (ref 1.010–1.025)
Urobilinogen, UA: 0.2 U/dL
pH, UA: 6.5 (ref 5.0–8.0)

## 2022-11-10 NOTE — Progress Notes (Signed)
ROB: Patient is a 27 y.o. [id number] at [redacted]w[redacted]d who presents for routine OB care.  Pregnancy is complicated by: h/o Depressive disorder; Extrinsic asthma; History of C-section x 3;  Biliary colic; Postpartum hypertension; Supervision of other normal pregnancy, antepartum; History of IUGR (intrauterine growth retardation) and stillbirth, currently pregnant, second trimester; History of gestational hypertension; and Short interval between pregnancies affecting pregnancy, antepartum.    Patient overall doing well today.  Had anatomy scan which was incomplete for spine, will order f/u US. Discussed recent UDS, positive for MJ and Oxy. Oxy not prescribed, patient notes she broke her tooth eating a pop tart, and was given 1 tablet of oxycodone to control the pain by a family member. Does not take regularly.  Discussed seeking dental care during/after the pregnancy as patient with poor dentition as it can be risk for preterm birth periodontal infections.  Notes that after pregnancy she plans on having all bad teeth removed and getting dentures.  Advised on not taking medications that are not prescribed.  Will repeat UDS in third trimester.  Patient also inquires about egg donation. Notes that she plans on tubal ligation after this pregnancy, but would like to donate if possible.  Advised on websites that she could gather more information.   Patient has questions as to whether her C-section can be scheduled for the day after her due date.  Notes that it is a special date and would like to see if she could carry her pregnancy until that time.  I discussed the risk factors of her pregnancy including prior history of C-section x 3 as well as history of IUGR in her last 2 pregnancies.  I discussed risk of going into labor earlier than her due date based on multiparity alone, or needing an earlier delivery date due to medical issues of her pregnancy as she is at high risk again.  Patient notes understanding.  States that if  this pregnancy remains normal she would like to go for desired date.   RTC in 4 weeks.

## 2022-11-10 NOTE — Progress Notes (Signed)
ROB [redacted]w[redacted]d: She is doing well. She reports good fetal movement. She has no new concerns today.

## 2022-12-08 ENCOUNTER — Ambulatory Visit: Payer: Medicaid Other

## 2022-12-08 DIAGNOSIS — Z3A24 24 weeks gestation of pregnancy: Secondary | ICD-10-CM | POA: Diagnosis not present

## 2022-12-08 DIAGNOSIS — Z362 Encounter for other antenatal screening follow-up: Secondary | ICD-10-CM

## 2022-12-09 ENCOUNTER — Encounter: Payer: Self-pay | Admitting: Obstetrics and Gynecology

## 2022-12-09 ENCOUNTER — Ambulatory Visit (INDEPENDENT_AMBULATORY_CARE_PROVIDER_SITE_OTHER): Payer: Medicaid Other | Admitting: Obstetrics and Gynecology

## 2022-12-09 VITALS — BP 99/61 | HR 88 | Wt 138.3 lb

## 2022-12-09 DIAGNOSIS — Z2821 Immunization not carried out because of patient refusal: Secondary | ICD-10-CM

## 2022-12-09 DIAGNOSIS — Z113 Encounter for screening for infections with a predominantly sexual mode of transmission: Secondary | ICD-10-CM

## 2022-12-09 DIAGNOSIS — Z131 Encounter for screening for diabetes mellitus: Secondary | ICD-10-CM

## 2022-12-09 DIAGNOSIS — O0992 Supervision of high risk pregnancy, unspecified, second trimester: Secondary | ICD-10-CM

## 2022-12-09 DIAGNOSIS — Z130 Encounter for screening for diseases of the blood and blood-forming organs and certain disorders involving the immune mechanism: Secondary | ICD-10-CM

## 2022-12-09 DIAGNOSIS — Z3A24 24 weeks gestation of pregnancy: Secondary | ICD-10-CM

## 2022-12-09 DIAGNOSIS — Z98891 History of uterine scar from previous surgery: Secondary | ICD-10-CM

## 2022-12-09 DIAGNOSIS — Z8759 Personal history of other complications of pregnancy, childbirth and the puerperium: Secondary | ICD-10-CM

## 2022-12-09 DIAGNOSIS — O09899 Supervision of other high risk pregnancies, unspecified trimester: Secondary | ICD-10-CM

## 2022-12-09 LAB — POCT URINALYSIS DIPSTICK OB
Bilirubin, UA: NEGATIVE
Blood, UA: NEGATIVE
Glucose, UA: NEGATIVE
Ketones, UA: NEGATIVE
Leukocytes, UA: NEGATIVE
Nitrite, UA: NEGATIVE
Spec Grav, UA: 1.025 (ref 1.010–1.025)
Urobilinogen, UA: 0.2 U/dL
pH, UA: 6 (ref 5.0–8.0)

## 2022-12-09 NOTE — Progress Notes (Signed)
ROB [redacted]w[redacted]d: She is doing well. She reports good fetal movement. She declines flu vaccine and has no new concerns today.

## 2022-12-09 NOTE — Progress Notes (Signed)
ROB: Patient is a 27 y.o. [id number] at [redacted]w[redacted]d who presents for routine OB care.  Pregnancy is complicated by: h/o Depressive disorder; Extrinsic asthma; History of C-section x 3;  Biliary colic; Postpartum hypertension; Supervision of other normal pregnancy, antepartum; History of IUGR (intrauterine growth retardation) and stillbirth, currently pregnant, second trimester; History of gestational hypertension; and Short interval between pregnancies affecting pregnancy, antepartum.    Patient overall doing well today.  F/u anatomy scan complete, growth is normal.  Declines flu vaccine. RTC in 4 weeks. For 28 weeks next visit and repeat UDS.

## 2023-01-05 ENCOUNTER — Encounter: Payer: Self-pay | Admitting: Obstetrics and Gynecology

## 2023-01-05 ENCOUNTER — Ambulatory Visit (INDEPENDENT_AMBULATORY_CARE_PROVIDER_SITE_OTHER): Payer: Medicaid Other | Admitting: Obstetrics and Gynecology

## 2023-01-05 ENCOUNTER — Other Ambulatory Visit: Payer: Medicaid Other

## 2023-01-05 VITALS — BP 93/63 | HR 90 | Wt 138.3 lb

## 2023-01-05 DIAGNOSIS — Z131 Encounter for screening for diabetes mellitus: Secondary | ICD-10-CM

## 2023-01-05 DIAGNOSIS — O0990 Supervision of high risk pregnancy, unspecified, unspecified trimester: Secondary | ICD-10-CM

## 2023-01-05 DIAGNOSIS — Z3A28 28 weeks gestation of pregnancy: Secondary | ICD-10-CM

## 2023-01-05 DIAGNOSIS — Z23 Encounter for immunization: Secondary | ICD-10-CM | POA: Diagnosis not present

## 2023-01-05 DIAGNOSIS — Z0283 Encounter for blood-alcohol and blood-drug test: Secondary | ICD-10-CM

## 2023-01-05 DIAGNOSIS — Z3A24 24 weeks gestation of pregnancy: Secondary | ICD-10-CM

## 2023-01-05 DIAGNOSIS — O0993 Supervision of high risk pregnancy, unspecified, third trimester: Secondary | ICD-10-CM

## 2023-01-05 DIAGNOSIS — O0992 Supervision of high risk pregnancy, unspecified, second trimester: Secondary | ICD-10-CM

## 2023-01-05 DIAGNOSIS — Z130 Encounter for screening for diseases of the blood and blood-forming organs and certain disorders involving the immune mechanism: Secondary | ICD-10-CM

## 2023-01-05 DIAGNOSIS — Z113 Encounter for screening for infections with a predominantly sexual mode of transmission: Secondary | ICD-10-CM

## 2023-01-05 LAB — POCT URINALYSIS DIPSTICK OB
Bilirubin, UA: NEGATIVE
Blood, UA: NEGATIVE
Glucose, UA: NEGATIVE
Ketones, UA: NEGATIVE
Leukocytes, UA: NEGATIVE
Nitrite, UA: NEGATIVE
POC,PROTEIN,UA: NEGATIVE
Spec Grav, UA: 1.015 (ref 1.010–1.025)
Urobilinogen, UA: 0.2 U/dL
pH, UA: 6 (ref 5.0–8.0)

## 2023-01-05 NOTE — Patient Instructions (Signed)
 Tdap (Tetanus, Diphtheria, Pertussis) Vaccine: What You Need to Know Many vaccine information statements are available in Spanish and other languages. See PromoAge.com.br. 1. Why get vaccinated? Tdap vaccine can prevent tetanus, diphtheria, and pertussis. Diphtheria and pertussis spread from person to person. Tetanus enters the body through cuts or wounds. TETANUS (T) causes painful stiffening of the muscles. Tetanus can lead to serious health problems, including being unable to open the mouth, having trouble swallowing and breathing, or death. DIPHTHERIA (D) can lead to difficulty breathing, heart failure, paralysis, or death. PERTUSSIS (aP), also known as "whooping cough," can cause uncontrollable, violent coughing that makes it hard to breathe, eat, or drink. Pertussis can be extremely serious especially in babies and young children, causing pneumonia, convulsions, brain damage, or death. In teens and adults, it can cause weight loss, loss of bladder control, passing out, and rib fractures from severe coughing. 2. Tdap vaccine Tdap is only for children 7 years and older, adolescents, and adults.  Adolescents should receive a single dose of Tdap, preferably at age 76 or 12 years. Pregnant people should get a dose of Tdap during every pregnancy, preferably during the early part of the third trimester, to help protect the newborn from pertussis. Infants are most at risk for severe, life-threatening complications from pertussis. Adults who have never received Tdap should get a dose of Tdap. Also, adults should receive a booster dose of either Tdap or Td (a different vaccine that protects against tetanus and diphtheria but not pertussis) every 10 years, or after 5 years in the case of a severe or dirty wound or burn. Tdap may be given at the same time as other vaccines. 3. Talk with your health care provider Tell your vaccine provider if the person getting the vaccine: Has had an allergic  reaction after a previous dose of any vaccine that protects against tetanus, diphtheria, or pertussis, or has any severe, life-threatening allergies Has had a coma, decreased level of consciousness, or prolonged seizures within 7 days after a previous dose of any pertussis vaccine (DTP, DTaP, or Tdap) Has seizures or another nervous system problem Has ever had Guillain-Barr Syndrome (also called "GBS") Has had severe pain or swelling after a previous dose of any vaccine that protects against tetanus or diphtheria In some cases, your health care provider may decide to postpone Tdap vaccination until a future visit. People with minor illnesses, such as a cold, may be vaccinated. People who are moderately or severely ill should usually wait until they recover before getting Tdap vaccine.  Your health care provider can give you more information. 4. Risks of a vaccine reaction Pain, redness, or swelling where the shot was given, mild fever, headache, feeling tired, and nausea, vomiting, diarrhea, or stomachache sometimes happen after Tdap vaccination. People sometimes faint after medical procedures, including vaccination. Tell your provider if you feel dizzy or have vision changes or ringing in the ears.  As with any medicine, there is a very remote chance of a vaccine causing a severe allergic reaction, other serious injury, or death. 5. What if there is a serious problem? An allergic reaction could occur after the vaccinated person leaves the clinic. If you see signs of a severe allergic reaction (hives, swelling of the face and throat, difficulty breathing, a fast heartbeat, dizziness, or weakness), call 9-1-1 and get the person to the nearest hospital. For other signs that concern you, call your health care provider.  Adverse reactions should be reported to the Vaccine Adverse Event Reporting  System (VAERS). Your health care provider will usually file this report, or you can do it yourself. Visit the  VAERS website at www.vaers.LAgents.no or call 437-731-6503. VAERS is only for reporting reactions, and VAERS staff members do not give medical advice. 6. The National Vaccine Injury Compensation Program The [employer] Vaccine Injury Compensation Program (VICP) is a federal program that was created to compensate people who may have been injured by certain vaccines. Claims regarding alleged injury or death due to vaccination have a time limit for filing, which may be as short as two years. Visit the VICP website at SpiritualWord.at or call (669) 837-1631 to learn about the program and about filing a claim. 7. How can I learn more? Ask your health care provider. Call your local or state health department. Visit the website of the Food and Drug Administration (FDA) for vaccine package inserts and additional information at FinderList.no. Contact the Centers for Disease Control and Prevention (CDC): Call (484) 483-2759 (1-800-CDC-INFO) or Visit CDC's website at PicCapture.uy. Source: CDC Vaccine Information Statement Tdap (Tetanus, Diphtheria, Pertussis) Vaccine (10/05/2019) This same material is available at FootballExhibition.com.br for no charge. This information is not intended to replace advice given to you by your health care provider. Make sure you discuss any questions you have with your health care provider. Document Revised: 06/02/2022 Document Reviewed: 04/02/2022 Elsevier Patient Education  2024 [employer].

## 2023-01-05 NOTE — Progress Notes (Signed)
ROB [redacted]w[redacted]d: She is doing well. She reports good fetal movement. She has no new concerns today. TDAP/BTC/1hr GTT done today.

## 2023-01-05 NOTE — Progress Notes (Signed)
ROB: Patient is a 27 y.o. [id number] at [redacted]w[redacted]d who presents for routine OB care.  Pregnancy is complicated by: Depressive disorder; Extrinsic asthma; History of C-section x 3; Biliary colic; Postpartum hypertension; Supervision of high risk pregnancy, antepartum; History of prior pregnancy with IUGR newborn; History of gestational hypertension; Short interval between pregnancies affecting pregnancy, antepartum.    Patient denies complaints today. Denies contractions, LOF, vaginal bleeding. Notes good fetal movement. For 28 week labs today.  Plans to  formula feed, desires Tubal Ligation (salpingectomy) for contraception. Medicaid sterilization form signed today. For Tdap today, signed blood consent. Repeat UDS today to h/o + MJ and Oxy on initial OB screen. RTC in 2 weeks.

## 2023-01-06 LAB — PAIN MGT SCRN (14 DRUGS), UR
Amphetamine Scrn, Ur: NEGATIVE ng/mL
BARBITURATE SCREEN URINE: NEGATIVE ng/mL
BENZODIAZEPINE SCREEN, URINE: NEGATIVE ng/mL
Buprenorphine, Urine: NEGATIVE ng/mL
CANNABINOIDS UR QL SCN: POSITIVE ng/mL — AB
Cocaine (Metab) Scrn, Ur: NEGATIVE ng/mL
Creatinine(Crt), U: 196.3 mg/dL (ref 20.0–300.0)
Fentanyl, Urine: NEGATIVE pg/mL
Meperidine Screen, Urine: NEGATIVE ng/mL
Methadone Screen, Urine: NEGATIVE ng/mL
OXYCODONE+OXYMORPHONE UR QL SCN: NEGATIVE ng/mL
Opiate Scrn, Ur: NEGATIVE ng/mL
Ph of Urine: 5.9 (ref 4.5–8.9)
Phencyclidine Qn, Ur: NEGATIVE ng/mL
Propoxyphene Scrn, Ur: NEGATIVE ng/mL
Tramadol Screen, Urine: NEGATIVE ng/mL

## 2023-01-06 LAB — 28 WEEK RH+PANEL
Basophils Absolute: 0 10*3/uL (ref 0.0–0.2)
Basos: 0 %
EOS (ABSOLUTE): 0.1 10*3/uL (ref 0.0–0.4)
Eos: 1 %
Gestational Diabetes Screen: 65 mg/dL — ABNORMAL LOW (ref 70–139)
HIV Screen 4th Generation wRfx: NONREACTIVE
Hematocrit: 35 % (ref 34.0–46.6)
Hemoglobin: 11.4 g/dL (ref 11.1–15.9)
Immature Grans (Abs): 0 10*3/uL (ref 0.0–0.1)
Immature Granulocytes: 0 %
Lymphocytes Absolute: 1.9 10*3/uL (ref 0.7–3.1)
Lymphs: 25 %
MCH: 30.2 pg (ref 26.6–33.0)
MCHC: 32.6 g/dL (ref 31.5–35.7)
MCV: 93 fL (ref 79–97)
Monocytes Absolute: 0.4 10*3/uL (ref 0.1–0.9)
Monocytes: 5 %
Neutrophils Absolute: 5.2 10*3/uL (ref 1.4–7.0)
Neutrophils: 69 %
Platelets: 223 10*3/uL (ref 150–450)
RBC: 3.78 x10E6/uL (ref 3.77–5.28)
RDW: 13.4 % (ref 11.7–15.4)
RPR Ser Ql: NONREACTIVE
WBC: 7.6 10*3/uL (ref 3.4–10.8)

## 2023-01-20 ENCOUNTER — Encounter: Payer: Self-pay | Admitting: Obstetrics and Gynecology

## 2023-01-20 ENCOUNTER — Ambulatory Visit (INDEPENDENT_AMBULATORY_CARE_PROVIDER_SITE_OTHER): Payer: Medicaid Other | Admitting: Obstetrics and Gynecology

## 2023-01-20 VITALS — BP 114/64 | HR 89 | Wt 140.0 lb

## 2023-01-20 DIAGNOSIS — O09899 Supervision of other high risk pregnancies, unspecified trimester: Secondary | ICD-10-CM

## 2023-01-20 DIAGNOSIS — Z3A30 30 weeks gestation of pregnancy: Secondary | ICD-10-CM

## 2023-01-20 DIAGNOSIS — Z98891 History of uterine scar from previous surgery: Secondary | ICD-10-CM

## 2023-01-20 DIAGNOSIS — O0992 Supervision of high risk pregnancy, unspecified, second trimester: Secondary | ICD-10-CM

## 2023-01-20 DIAGNOSIS — Z8759 Personal history of other complications of pregnancy, childbirth and the puerperium: Secondary | ICD-10-CM

## 2023-01-20 LAB — POCT URINALYSIS DIPSTICK
Bilirubin, UA: NEGATIVE
Blood, UA: NEGATIVE
Glucose, UA: NEGATIVE
Ketones, UA: NEGATIVE
Leukocytes, UA: NEGATIVE
Nitrite, UA: NEGATIVE
Protein, UA: NEGATIVE
Spec Grav, UA: 1.02 (ref 1.010–1.025)
Urobilinogen, UA: 0.2 U/dL
pH, UA: 7.5 (ref 5.0–8.0)

## 2023-01-20 NOTE — Progress Notes (Signed)
ROB [redacted]w[redacted]d: She is doing well. She reports good fetal movement. She has no new concerns today.

## 2023-01-20 NOTE — Progress Notes (Signed)
ROB: Patient is a 27 y.o. [id number] at [redacted]w[redacted]d who presents for routine OB care.  Pregnancy is complicated by: Depressive disorder; Extrinsic asthma; History of C-section x 3; Biliary colic; Postpartum hypertension; Supervision of high risk pregnancy, antepartum; History of prior pregnancy with IUGR newborn; History of gestational hypertension; Short interval between pregnancies affecting pregnancy, antepartum.    Patient notes feeling more uncomfortable lately, fetus kicks under her ribs. Denies contractions, LOF, vaginal bleeding. Notes good fetal movement. Is ready to set date for her C-section. Discussed typical delivery at 39 weeks, however patient desires to see if she can make it to her due date (notes it is a special date for her and her family). Advised on low likelihood of pregnancy reaching that date and incurring an earlier delivery date due to spontaneous labor which patient notes she is ok with if it happens. Continue to monitor growth, patient with h/o IUGR in prior pregnancy.  Normal 28 week labs. To discuss RSV vaccine next visit. RTC in 2 weeks.    OB History  Gravida Para Term Preterm AB Living  7 3 3   3 3   SAB IAB Ectopic Multiple Live Births  3     0 3    # Outcome Date GA Lbr Len/2nd Weight Sex Type Anes PTL Lv  7 Current           6 Term 02/24/22 [redacted]w[redacted]d  4 lb 7.6 oz (2.03 kg) F CS-LTranv Spinal  LIV  5 Term 12/30/17 [redacted]w[redacted]d  6 lb 11.2 oz (3.04 kg) F CS-LTranv Spinal  LIV  4 Term 11/29/16 [redacted]w[redacted]d  6 lb 10.5 oz (3.02 kg) M CS-LTranv EPI  LIV  3 SAB 2015        ND  2 SAB 2012        ND  1 SAB      SAB

## 2023-01-20 NOTE — Addendum Note (Signed)
Addended by: Burtis Junes on: 01/20/2023 10:59 AM   Modules accepted: Orders

## 2023-02-03 ENCOUNTER — Telehealth: Payer: Self-pay | Admitting: Obstetrics and Gynecology

## 2023-02-03 ENCOUNTER — Encounter: Payer: Medicaid Other | Admitting: Obstetrics and Gynecology

## 2023-02-03 DIAGNOSIS — O0990 Supervision of high risk pregnancy, unspecified, unspecified trimester: Secondary | ICD-10-CM

## 2023-02-03 DIAGNOSIS — O0992 Supervision of high risk pregnancy, unspecified, second trimester: Secondary | ICD-10-CM

## 2023-02-03 DIAGNOSIS — Z3A32 32 weeks gestation of pregnancy: Secondary | ICD-10-CM

## 2023-02-03 NOTE — Telephone Encounter (Signed)
Reached out to pt to reschedule ROB appt that was scheduled on 02/03/2023 at 8:35.  Was able to get appt rescheduled to 02/04/2023 at 9:55 with S. Free.

## 2023-02-04 ENCOUNTER — Ambulatory Visit (INDEPENDENT_AMBULATORY_CARE_PROVIDER_SITE_OTHER): Payer: Medicaid Other

## 2023-02-04 VITALS — BP 105/59 | HR 84 | Wt 141.0 lb

## 2023-02-04 DIAGNOSIS — Z3A32 32 weeks gestation of pregnancy: Secondary | ICD-10-CM

## 2023-02-04 DIAGNOSIS — Z23 Encounter for immunization: Secondary | ICD-10-CM

## 2023-02-04 DIAGNOSIS — Z2911 Encounter for prophylactic immunotherapy for respiratory syncytial virus (RSV): Secondary | ICD-10-CM

## 2023-02-04 DIAGNOSIS — O0990 Supervision of high risk pregnancy, unspecified, unspecified trimester: Secondary | ICD-10-CM

## 2023-02-04 DIAGNOSIS — O26843 Uterine size-date discrepancy, third trimester: Secondary | ICD-10-CM

## 2023-02-04 NOTE — Assessment & Plan Note (Signed)
-   RSV vaccination given today. - Measuring S<D today (28 cm). Growth ultrasound ordered. - Still trying to decide on timing of scheduled CS. Will discuss with Dr. Valentino Saxon at next appointment.

## 2023-02-04 NOTE — Progress Notes (Signed)
    Return Prenatal Note   Assessment/Plan   Plan  27 y.o. [id number] at [redacted]w[redacted]d presents for follow-up OB visit. Reviewed prenatal record including previous visit note.  Supervision of high risk pregnancy, antepartum - RSV vaccination given today. - Measuring S<D today (28 cm). Growth ultrasound ordered. - Still trying to decide on timing of scheduled CS. Will discuss with Dr. Valentino Saxon at next appointment.    Orders Placed This Encounter  Procedures   US OB Follow Up    Standing Status:   Future    Standing Expiration Date:   05/05/2023    Order Specific Question:   Reason for exam:    Answer:   S<D    Order Specific Question:   Preferred imaging location?    Answer:   Internal   Respiratory syncytial virus vaccine, preF, subunit, bivalent,(Abrysvo)   Return in about 2 weeks (around 02/18/2023) for ROB with [facility].   Future Appointments  Date Time Provider Department Center  02/08/2023  1:00 PM [facility] [place] 1 [facility] None  02/17/2023  9:35 AM Hildred Laser, MD [facility] None    For next visit:  continue with routine prenatal care     Subjective   27 y.o. [id number] at [redacted]w[redacted]d presents for this follow-up prenatal visit.  Patient has no concerns. Patient reports: Movement: Present Contractions: Irritability  Objective   Flow sheet Vitals: Pulse Rate: 84 BP: (!) 105/59 Fundal Height: 28 cm Fetal Heart Rate (bpm): 150 Presentation: Vertex Total weight gain: 6 lb (2.722 kg)  General Appearance  No acute distress, well appearing, and well nourished Pulmonary   Normal work of breathing Neurologic   Alert and oriented to person, place, and time Psychiatric   Mood and affect within normal limits  Lindalou Hose Janeva Peaster, CNM  02/03/2410:19 AM

## 2023-02-08 ENCOUNTER — Other Ambulatory Visit: Payer: Medicaid Other

## 2023-02-08 DIAGNOSIS — Z3A31 31 weeks gestation of pregnancy: Secondary | ICD-10-CM | POA: Diagnosis not present

## 2023-02-08 DIAGNOSIS — O26843 Uterine size-date discrepancy, third trimester: Secondary | ICD-10-CM | POA: Diagnosis not present

## 2023-02-08 DIAGNOSIS — O0990 Supervision of high risk pregnancy, unspecified, unspecified trimester: Secondary | ICD-10-CM

## 2023-02-16 ENCOUNTER — Other Ambulatory Visit: Payer: Self-pay

## 2023-02-16 DIAGNOSIS — O365930 Maternal care for other known or suspected poor fetal growth, third trimester, not applicable or unspecified: Secondary | ICD-10-CM

## 2023-02-17 ENCOUNTER — Ambulatory Visit: Payer: Medicaid Other | Admitting: Obstetrics and Gynecology

## 2023-02-17 ENCOUNTER — Other Ambulatory Visit: Payer: Medicaid Other

## 2023-02-17 VITALS — BP 111/69 | HR 80 | Wt 142.4 lb

## 2023-02-17 DIAGNOSIS — O365930 Maternal care for other known or suspected poor fetal growth, third trimester, not applicable or unspecified: Secondary | ICD-10-CM

## 2023-02-17 DIAGNOSIS — O0990 Supervision of high risk pregnancy, unspecified, unspecified trimester: Secondary | ICD-10-CM

## 2023-02-17 DIAGNOSIS — Z98891 History of uterine scar from previous surgery: Secondary | ICD-10-CM

## 2023-02-17 DIAGNOSIS — O365990 Maternal care for other known or suspected poor fetal growth, unspecified trimester, not applicable or unspecified: Secondary | ICD-10-CM

## 2023-02-17 DIAGNOSIS — Z3A34 34 weeks gestation of pregnancy: Secondary | ICD-10-CM | POA: Diagnosis not present

## 2023-02-17 DIAGNOSIS — O34219 Maternal care for unspecified type scar from previous cesarean delivery: Secondary | ICD-10-CM

## 2023-02-17 DIAGNOSIS — O09899 Supervision of other high risk pregnancies, unspecified trimester: Secondary | ICD-10-CM

## 2023-02-17 DIAGNOSIS — Z8759 Personal history of other complications of pregnancy, childbirth and the puerperium: Secondary | ICD-10-CM

## 2023-02-17 LAB — POCT URINALYSIS DIPSTICK OB
Bilirubin, UA: NEGATIVE
Blood, UA: NEGATIVE
Glucose, UA: NEGATIVE
Ketones, UA: NEGATIVE
Leukocytes, UA: NEGATIVE
Nitrite, UA: NEGATIVE
POC,PROTEIN,UA: NEGATIVE
Spec Grav, UA: 1.015 (ref 1.010–1.025)
Urobilinogen, UA: 0.2 U/dL
pH, UA: 7 (ref 5.0–8.0)

## 2023-02-17 NOTE — Progress Notes (Signed)
ROB [redacted]w[redacted]d: She is doing well. She reports good fetal movement and has no new concerns today.

## 2023-02-17 NOTE — Progress Notes (Signed)
ROB: Patient is a 27 y.o. [id number] at [redacted]w[redacted]d who presents for routine OB care.  Pregnancy is complicated by: Depressive disorder; Extrinsic asthma; History of 3 cesarean sections; Biliary colic; Postpartum hypertension; Supervision of high risk pregnancy, antepartum; History of prior pregnancy with IUGR newborn; History of gestational hypertension; Short interval between pregnancies affecting pregnancy, antepartum.   Patient denies complaints today.  Has questions regarding delivery planning based on recent US on 02/08/23. Review of imaging notes concern for growth restriction. Patient with h/o growth restriction in prior pregnancy as well. [place] results noting   Impression: 1. [redacted]w[redacted]d Viable Single Intrauterine pregnancy dated by previously established criteria. 2. Growth is 17th percentile with AC measruing in the 5th percentile HC is less that the 2nd %, FL is less than the 5th %, EFW: 1682g, 3lb 11oz. Decreased AFI is 8.5 cm.  3. BPP is 8/8  Advised on need for referral to MFM for f/u growth in 2 weeks, order placed. Is scheduled for a BPP today, can reassess fluid levels. Discussed that if growth remains restricted, would recommend delivery  between 38-39 weeks (patient initially desired to try to take it to her due date).  Notes RSV vaccine was given last visit. For 36 week labs next visit.

## 2023-02-24 ENCOUNTER — Other Ambulatory Visit: Payer: Medicaid Other

## 2023-02-24 ENCOUNTER — Encounter: Payer: Self-pay | Admitting: Obstetrics and Gynecology

## 2023-02-24 DIAGNOSIS — O365990 Maternal care for other known or suspected poor fetal growth, unspecified trimester, not applicable or unspecified: Secondary | ICD-10-CM | POA: Insufficient documentation

## 2023-02-24 DIAGNOSIS — O365939 Maternal care for other known or suspected poor fetal growth, third trimester, other fetus: Secondary | ICD-10-CM | POA: Insufficient documentation

## 2023-02-24 NOTE — Telephone Encounter (Signed)
So should I tell her to go to UC or do you want to see her tomorrow.

## 2023-02-24 NOTE — Telephone Encounter (Signed)
Either one is fine

## 2023-02-25 ENCOUNTER — Telehealth: Payer: Self-pay | Admitting: Obstetrics

## 2023-02-25 ENCOUNTER — Ambulatory Visit: Payer: Medicaid Other | Admitting: Obstetrics and Gynecology

## 2023-02-25 NOTE — Telephone Encounter (Signed)
The patient was scheduled for ultrasound on 12/26. The ultrasound tech was out due to illness.The patient was advise we would call with a work in appointment. We contacted her this AM on 12/27 to see if she would come in at 11:30. The patient declined due to her father's birthday party. She also said she would keep appointment scheduled for 12/31 for ultrasound. She asked if we could offer an ultrasound for 1/3 for same day work in.

## 2023-02-25 NOTE — Telephone Encounter (Signed)
The patient is aware to follow up for 12/31 ultrasound appointment already scheduled. The patient is aware and confirmed

## 2023-02-28 ENCOUNTER — Encounter: Payer: Self-pay | Admitting: Obstetrics and Gynecology

## 2023-03-01 ENCOUNTER — Ambulatory Visit: Payer: Medicaid Other | Attending: Obstetrics and Gynecology

## 2023-03-01 ENCOUNTER — Ambulatory Visit: Payer: Medicaid Other | Admitting: *Deleted

## 2023-03-01 VITALS — BP 109/66 | HR 69

## 2023-03-01 DIAGNOSIS — O0990 Supervision of high risk pregnancy, unspecified, unspecified trimester: Secondary | ICD-10-CM | POA: Insufficient documentation

## 2023-03-01 DIAGNOSIS — O34219 Maternal care for unspecified type scar from previous cesarean delivery: Secondary | ICD-10-CM

## 2023-03-01 DIAGNOSIS — O365930 Maternal care for other known or suspected poor fetal growth, third trimester, not applicable or unspecified: Secondary | ICD-10-CM

## 2023-03-01 DIAGNOSIS — O99323 Drug use complicating pregnancy, third trimester: Secondary | ICD-10-CM | POA: Diagnosis not present

## 2023-03-01 DIAGNOSIS — Z3A36 36 weeks gestation of pregnancy: Secondary | ICD-10-CM

## 2023-03-01 DIAGNOSIS — F1920 Other psychoactive substance dependence, uncomplicated: Secondary | ICD-10-CM

## 2023-03-01 DIAGNOSIS — O365990 Maternal care for other known or suspected poor fetal growth, unspecified trimester, not applicable or unspecified: Secondary | ICD-10-CM | POA: Insufficient documentation

## 2023-03-01 DIAGNOSIS — O133 Gestational [pregnancy-induced] hypertension without significant proteinuria, third trimester: Secondary | ICD-10-CM

## 2023-03-04 ENCOUNTER — Encounter: Payer: Medicaid Other | Admitting: Obstetrics

## 2023-03-04 DIAGNOSIS — Z3685 Encounter for antenatal screening for Streptococcus B: Secondary | ICD-10-CM

## 2023-03-04 DIAGNOSIS — Z113 Encounter for screening for infections with a predominantly sexual mode of transmission: Secondary | ICD-10-CM

## 2023-03-04 DIAGNOSIS — O0990 Supervision of high risk pregnancy, unspecified, unspecified trimester: Secondary | ICD-10-CM

## 2023-03-04 DIAGNOSIS — Z3A36 36 weeks gestation of pregnancy: Secondary | ICD-10-CM

## 2023-03-06 MED ORDER — HYDROCODONE-ACETAMINOPHEN 5-325 MG PO TABS: 1.0000 | ORAL_TABLET | Freq: Four times a day (QID) | ORAL | 0 refills | Status: DC | PRN

## 2023-03-08 ENCOUNTER — Encounter: Payer: Self-pay | Admitting: Obstetrics and Gynecology

## 2023-03-08 ENCOUNTER — Ambulatory Visit (INDEPENDENT_AMBULATORY_CARE_PROVIDER_SITE_OTHER): Payer: Medicaid Other | Admitting: Obstetrics and Gynecology

## 2023-03-08 VITALS — BP 117/67 | HR 84 | Wt 147.7 lb

## 2023-03-08 DIAGNOSIS — O0990 Supervision of high risk pregnancy, unspecified, unspecified trimester: Secondary | ICD-10-CM

## 2023-03-08 DIAGNOSIS — K0889 Other specified disorders of teeth and supporting structures: Secondary | ICD-10-CM

## 2023-03-08 DIAGNOSIS — Z8759 Personal history of other complications of pregnancy, childbirth and the puerperium: Secondary | ICD-10-CM

## 2023-03-08 DIAGNOSIS — Z98891 History of uterine scar from previous surgery: Secondary | ICD-10-CM

## 2023-03-08 DIAGNOSIS — O365939 Maternal care for other known or suspected poor fetal growth, third trimester, other fetus: Secondary | ICD-10-CM

## 2023-03-08 DIAGNOSIS — O09899 Supervision of other high risk pregnancies, unspecified trimester: Secondary | ICD-10-CM

## 2023-03-08 DIAGNOSIS — K089 Disorder of teeth and supporting structures, unspecified: Secondary | ICD-10-CM | POA: Insufficient documentation

## 2023-03-08 DIAGNOSIS — Z01812 Encounter for preprocedural laboratory examination: Secondary | ICD-10-CM

## 2023-03-08 DIAGNOSIS — Z3A37 37 weeks gestation of pregnancy: Secondary | ICD-10-CM

## 2023-03-08 LAB — POCT URINALYSIS DIPSTICK OB
Bilirubin, UA: NEGATIVE
Blood, UA: NEGATIVE
Glucose, UA: NEGATIVE
Ketones, UA: NEGATIVE
Leukocytes, UA: NEGATIVE
Nitrite, UA: NEGATIVE
Spec Grav, UA: 1.02 (ref 1.010–1.025)
Urobilinogen, UA: 0.2 U/dL
pH, UA: 6.5 (ref 5.0–8.0)

## 2023-03-08 MED ORDER — OXYCODONE-ACETAMINOPHEN 5-325 MG PO TABS: 1.0000 | ORAL_TABLET | Freq: Four times a day (QID) | ORAL | 0 refills | Status: DC | PRN

## 2023-03-08 NOTE — Progress Notes (Signed)
 ROB: Patient is a 28 y.o. [id number] at [redacted]w[redacted]d who presents for routine OB care.  Pregnancy is complicated by:  history of Depressive disorder; Extrinsic asthma; History of 3 cesarean sections; Biliary colic; History of postpartum hypertension; Supervision of high risk pregnancy, antepartum; History of prior pregnancy with IUGR newborn; History of gestational hypertension; Short interval between pregnancies affecting pregnancy, antepartum; Flu vaccine refused; and SGA fetus affecting care of mother on their problem list.    Patient has had complaints of significant tooth pain over the past 1.5 weeks.  Has been dealing with on and off tooth pain throughout the second and third trimester and was attempting to wait until the pregnancy was over to see a dentist as she notes she will need several teeth pulled and is planning on getting dental implants. Notes ~ 1.5 weeks ago she was eating pizza and felt a tooth chip in her lower left jaw, and since then the pain has intensified tremendously. Is down to eating soft foods, using Orajel several times a day and ES Tylenol  without relief. Prescribed Vicodin over the weekend, however notes that the pain only improved slightly the first day and now is back to hurting. Oral exam notes poor dentition with several rotting incisors in upper mouth and lower mouth. Molars and canines for the most part appear intact (although in left jaw possible chip vs immature molar growing). No obvious abscess present. Can change prescription to Percocet to see if this helps, but if no improvement, advised that she should see a dentist ASAP.   Reviewed recent growth scan by MFM on 12/31, growth is borderline at 11%ile, Normal fluid (9.3 cm).  Don't recommend earlier delivery at this time but do recommend weekly antenatal testing (BPP or NST).  Will schedule weekly BPPs. Also discussed delivery planning as no earlier delivery indicated. Will schedule for 39 weeks for RCS with BTL on 1/21. RTC in 1  week.

## 2023-03-08 NOTE — Progress Notes (Signed)
 ROB [redacted]w[redacted]d: She is having a lot of oral pain. Medication is not helping her at all. She does report good fetal movement.

## 2023-03-09 ENCOUNTER — Other Ambulatory Visit: Payer: Self-pay | Admitting: Obstetrics and Gynecology

## 2023-03-09 ENCOUNTER — Telehealth: Payer: Self-pay

## 2023-03-09 ENCOUNTER — Ambulatory Visit: Payer: Medicaid Other

## 2023-03-09 DIAGNOSIS — Z3A37 37 weeks gestation of pregnancy: Secondary | ICD-10-CM

## 2023-03-09 DIAGNOSIS — O365930 Maternal care for other known or suspected poor fetal growth, third trimester, not applicable or unspecified: Secondary | ICD-10-CM

## 2023-03-09 NOTE — Telephone Encounter (Signed)
 Attempted to speak to pharmacist, unable to reach employee.

## 2023-03-10 NOTE — Telephone Encounter (Signed)
 Pt called triage to let her doctor know pharmacy is not filling Rx prescribed on Monday. I called pharmacy to check on this, spoke to [facility] and she ran the Rx and it went through. Pt aware to pick it up today.

## 2023-03-14 ENCOUNTER — Other Ambulatory Visit: Payer: Self-pay | Admitting: Obstetrics and Gynecology

## 2023-03-14 DIAGNOSIS — O365930 Maternal care for other known or suspected poor fetal growth, third trimester, not applicable or unspecified: Secondary | ICD-10-CM

## 2023-03-15 ENCOUNTER — Ambulatory Visit (INDEPENDENT_AMBULATORY_CARE_PROVIDER_SITE_OTHER): Payer: Medicaid Other

## 2023-03-15 ENCOUNTER — Encounter: Payer: Self-pay | Admitting: Obstetrics and Gynecology

## 2023-03-15 ENCOUNTER — Encounter
Admission: RE | Admit: 2023-03-15 | Discharge: 2023-03-15 | Disposition: A | Discharge status: Home / Self Care | Payer: Medicaid Other | Source: Ambulatory Visit | Attending: Obstetrics and Gynecology | Admitting: Obstetrics and Gynecology

## 2023-03-15 ENCOUNTER — Other Ambulatory Visit: Payer: Self-pay

## 2023-03-15 ENCOUNTER — Observation Stay
Admission: EM | Admit: 2023-03-15 | Discharge: 2023-03-15 | Disposition: A | Discharge status: Home / Self Care | Payer: Medicaid Other

## 2023-03-15 ENCOUNTER — Ambulatory Visit (INDEPENDENT_AMBULATORY_CARE_PROVIDER_SITE_OTHER): Payer: Medicaid Other | Admitting: Obstetrics and Gynecology

## 2023-03-15 VITALS — Ht 59.0 in | Wt 147.0 lb

## 2023-03-15 VITALS — BP 113/68 | HR 76 | Wt 144.6 lb

## 2023-03-15 DIAGNOSIS — Z8759 Personal history of other complications of pregnancy, childbirth and the puerperium: Secondary | ICD-10-CM

## 2023-03-15 DIAGNOSIS — O368330 Maternal care for abnormalities of the fetal heart rate or rhythm, third trimester, not applicable or unspecified: Secondary | ICD-10-CM | POA: Diagnosis present

## 2023-03-15 DIAGNOSIS — O365930 Maternal care for other known or suspected poor fetal growth, third trimester, not applicable or unspecified: Secondary | ICD-10-CM

## 2023-03-15 DIAGNOSIS — O289 Unspecified abnormal findings on antenatal screening of mother: Secondary | ICD-10-CM

## 2023-03-15 DIAGNOSIS — Z3A38 38 weeks gestation of pregnancy: Secondary | ICD-10-CM | POA: Insufficient documentation

## 2023-03-15 DIAGNOSIS — Z98891 History of uterine scar from previous surgery: Secondary | ICD-10-CM

## 2023-03-15 DIAGNOSIS — O0990 Supervision of high risk pregnancy, unspecified, unspecified trimester: Secondary | ICD-10-CM

## 2023-03-15 DIAGNOSIS — Z01812 Encounter for preprocedural laboratory examination: Secondary | ICD-10-CM

## 2023-03-15 DIAGNOSIS — K089 Disorder of teeth and supporting structures, unspecified: Secondary | ICD-10-CM

## 2023-03-15 DIAGNOSIS — O09899 Supervision of other high risk pregnancies, unspecified trimester: Secondary | ICD-10-CM

## 2023-03-15 DIAGNOSIS — O365939 Maternal care for other known or suspected poor fetal growth, third trimester, other fetus: Secondary | ICD-10-CM

## 2023-03-15 DIAGNOSIS — K0889 Other specified disorders of teeth and supporting structures: Secondary | ICD-10-CM

## 2023-03-15 DIAGNOSIS — O288 Other abnormal findings on antenatal screening of mother: Secondary | ICD-10-CM

## 2023-03-15 HISTORY — DX: Gestational (pregnancy-induced) hypertension without significant proteinuria, unspecified trimester: O13.9

## 2023-03-15 LAB — POCT URINALYSIS DIPSTICK OB
Bilirubin, UA: NEGATIVE
Blood, UA: NEGATIVE
Glucose, UA: NEGATIVE
Ketones, UA: NEGATIVE
Leukocytes, UA: NEGATIVE
Nitrite, UA: NEGATIVE
POC,PROTEIN,UA: NEGATIVE
Spec Grav, UA: 1.015 (ref 1.010–1.025)
Urobilinogen, UA: 0.2 U/dL
pH, UA: 7 (ref 5.0–8.0)

## 2023-03-15 NOTE — Progress Notes (Signed)
 ROB: Patient is a 28 y.o. [id number] at [redacted]w[redacted]d who presents for routine OB care.  Pregnancy is complicated by:  history of Depressive disorder; Extrinsic asthma; History of 3 cesarean sections; Biliary colic; History of postpartum hypertension; Supervision of high risk pregnancy, antepartum; History of prior pregnancy with IUGR newborn; History of gestational hypertension; Short interval between pregnancies affecting pregnancy, antepartum; Flu vaccine refused; and SGA fetus affecting care of mother.    Patient without major complaints today.  Notes that her tooth pain is somewhat better with use of the Percocet but still notes some pain at times. Has not eaten much over the past few weeks due to the pain. Weight loss noted since last week's visit, ~ 3 lbs.  Plans to f/u ASAP with dentist after delivery.   Patient being followed today with weekly BPPs due to borderline growth restriction noted on MFM scan on 12/31 (11%ile overall growth, HC <1%, AC 29%).  Today's BPP noted 4/8 (2 off for movement and tone).  However patient notes that when she got to the waiting room after the ultrasound, her baby became very active.  Also AFI was noted to 6.2 cm (decreased from 9.1 cm last week).   Patient denies any active leaking fluid or ruptured membranes, however did note a slight increase in discharge several days ago which stopped on the same day. Nitrazine test negative in office today. Can consider [facility] inpatient to confirm.   I discussed concerns regarding recent imaging, would recommend further surveillance with NST and possibly prolonged monitoring.  However typically breathing is noted to be the first score to be absent with distress and was present on today's scan. Advised for patient to have a snack or drink something sugary while on L&D to see if fetal movement continues to be active, since she has not been eating much recently.  Discussed that if NST improved, would recommend f/u surveillance again on Friday  and can continue pregnancy until schedule C/S date of 1/21 (which patient prefers due to childcare issues).  If NST not reactive, would recommend delivery today. Patient notes understanding.

## 2023-03-15 NOTE — Progress Notes (Signed)
 Pt presents to L/D triage from office for NST after BPP 4/8. Pt reports she is now feeing fetal movement. No bleeding or LOF (pt reports ongoing increased discharge. MD aware. Nitrazine negative and speculum exam done in office). Monitors applied and assessing. Initial FHT 145. NST reactive- CNM notified.  Pt reports pain 6/10-mouth pain that has been ongoing. Pt took PRN pain medication today.

## 2023-03-15 NOTE — Patient Instructions (Addendum)
 Your procedure is scheduled on: Tuesday, January 21  Tentative arrival time is 5:30 am.  Arrival Time: Please call Labor and Delivery the day before your scheduled C-Section to find out your arrival time. [phone].  Arrival: If your arrival time is prior to 6:00 am, please enter through the Emergency Room Entrance and you will be directed to Labor and Delivery. If your arrival time is 6:00 am or later, please enter the Medical Mall and follow the greeter's instructions.  REMEMBER: Instructions that are not followed completely may result in serious medical risk, up to and including death; or upon the discretion of your surgeon and anesthesiologist your surgery may need to be rescheduled.  Do not eat food after midnight the night before surgery.  No gum chewing or hard candies.  You may however, drink CLEAR liquids up to 2 hours before you are scheduled to arrive for your surgery. Do not drink anything within 2 hours of your scheduled arrival time.  Clear liquids include: - water   - apple juice without pulp - gatorade (not RED colors) - black coffee or tea (Do NOT add milk or creamers to the coffee or tea) Do NOT drink anything that is not on this list.  In addition, your doctor has ordered for you to drink the provided:  Ensure Pre-Surgery Clear Carbohydrate Drink  Drinking this carbohydrate drink up to two hours before surgery helps to reduce insulin resistance and improve patient outcomes. Please complete drinking 2 hours before scheduled arrival time.  One week prior to surgery: starting January 14 Stop Anti-inflammatories (NSAIDS) such as Advil , Aleve, Ibuprofen , Motrin , Naproxen, Naprosyn and Aspirin based products such as Excedrin, Goody's Powder, BC Powder. Stop ANY OVER THE COUNTER supplements until after surgery.  You may however, continue to take Tylenol  if needed for pain up until the day of surgery.  Continue taking all of your other prescription medications up until  the day of surgery.  ON THE DAY OF SURGERY DO NOT TAKE ANY MEDICATIONS   No Alcohol for 24 hours before or after surgery.  No Smoking including e-cigarettes for 24 hours prior to surgery.  No chewable tobacco products for at least 6 hours prior to surgery.  No nicotine  patches on the day of surgery.  Do not use any recreational drugs for at least a week prior to your surgery.  Please be advised that the combination of cocaine and anesthesia may have negative outcomes, up to and including death. If you test positive for cocaine, your surgery will be cancelled.  On the morning of surgery brush your teeth with toothpaste and water , you may rinse your mouth with mouthwash if you wish. Do not swallow any toothpaste or mouthwash.  Use CHG wipes as directed on instruction sheet.  Do not wear jewelry, make-up, hairpins, clips or nail polish.  For welded (permanent) jewelry: bracelets, anklets, waist bands, etc.  Please have this removed prior to surgery.  If it is not removed, there is a chance that hospital personnel will need to cut it off on the day of surgery.  Do not wear lotions, powders, or perfumes.   Do not shave body hair from the neck down 48 hours before surgery.  Contact lenses, hearing aids and dentures may not be worn into surgery.  Do not bring valuables to the hospital. [facility] is not responsible for any missing/lost belongings or valuables.   Notify your doctor if there is any change in your medical condition (cold, fever, infection).  Wear  comfortable clothing (specific to your surgery type) to the hospital.  After surgery, you can help prevent lung complications by doing breathing exercises.  Take deep breaths and cough every 1-2 hours. Your doctor may order a device called an Incentive Spirometer to help you take deep breaths. When coughing or sneezing, hold a pillow firmly against your incision with both hands. This is called "splinting." Doing this helps  protect your incision. It also decreases belly discomfort.  Please call the Pre-admissions Testing Dept. at [phone] if you have any questions about these instructions.  Surgery Visitation Policy:  Visitor Passes   All visitors, including children, need an identification sticker when visiting. These stickers must be worn where they can be seen.   Labor & Delivery  Laboring women may have one designated support person and two other visitors of any age visit. The support person must remain the same. The visitors may switch with other visitors. Visitation is permitted 24 hours per day. The designated support person or a visitor over the age of 16 may sleep overnight in the patient's room. A doula registered with [facility] for labor and delivery support is not considered a visitor. Doulas not registered with [facility] are considered visitors.  Mother Baby Unit, OB Specialty and Gynecological Care  A designated support person and three visitors of any age may visit. The three visitors may switch out. The designated support person or a visitor age 22 or older may stay overnight in the room. During the postpartum period (up to 6 weeks), if the mother is the patient, she can have her newborn stay with her if there is another support person present who can be responsible for the baby.   Temporary Visitor Restrictions Due to increasing cases of flu, RSV and COVID-19: Children ages 64 and under will not be able to visit patients in [facility] hospitals under most circumstances.   Preparing the Skin Before Surgery     To help prevent the risk of infection at your surgical site, we are now providing you with rinse-free Sage 2% Chlorhexidine  Gluconate (CHG) disposable wipes.  Chlorhexidine  Gluconate (CHG) Soap  o An antiseptic cleaner that kills germs and bonds with the skin to continue killing germs even after washing  o Used for showering the night before surgery and morning  of surgery  The night before surgery: Shower or bathe with warm water . Do not apply perfume, lotions, powders. Wait one hour after shower. Skin should be dry and cool. Open Sage wipe package - use 6 disposable cloths. Wipe body using one cloth for the right arm, one cloth for the left arm, one cloth for the right leg, one cloth for the left leg, one cloth for the chest/abdomen area, and one cloth for the back. Do not use on open wounds or sores. Do not use on face or genitals (private parts). If you are breast feeding, do not use on breasts. 5. Do not rinse, allow to dry. 6. Skin may feel tacky for several minutes. 7. Dress in clean clothes. 8. Place clean sheets on your bed and do not sleep with pets.  REPEAT ABOVE ON THE MORNING OF SURGERY BEFORE ARRIVING TO THE HOSPITAL.

## 2023-03-15 NOTE — OB Triage Note (Signed)
 LABOR & DELIVERY OB TRIAGE NOTE  SUBJECTIVE  HPI [name] is a 28 y.o. H2E6966 at [redacted]w[redacted]d who presents to Labor & Delivery for extended monitoring after BPP of 4/8 in clinic today.   Sent from clinic by Dr. Connell for extended [facility] monitoring. Patient felt baby start moving vigorously after BPP was completed and feels that baby was only sleeping during the US . She strongly desires to wait for her repeat CS until next week.   OB History     Gravida  7   Para  3   Term  3   Preterm      AB  3   Living  3      SAB  3   IAB      Ectopic      Multiple  0   Live Births  3            OBJECTIVE  BP (!) 116/59   Temp 98.1 F (36.7 C) (Oral)   Resp 18   Ht 4' 11 (1.499 m)   Wt 65.3 kg   LMP 06/22/2022 (Exact Date)   BMI 29.08 kg/m   General: AO x4 Heart: normal heart rate Lungs: normal work of breathing Abdomen: gravid, soft, non-tender Cervical exam:   deferred  NST I reviewed the NST and it was reactive.  Baseline: 135 bpm Variability: moderate Accelerations: present Decelerations:one possible small variable decel, broken tracing at that time Toco: irregular contractions   ASSESSMENT Impression  1) Pregnancy at [id number], [redacted]w[redacted]d, Estimated Date of Delivery: 03/29/23 2) Reassuring maternal/fetal status 3) FHR tracing over 1 hour reviewed with Dr. Connell who agrees it is reassuring.   PLAN 1) Plan for repeat BPP on Friday 1/17. Message sent to clinic.  2) Patient discharged with strict precautions to return if she does not feel baby move. Also encouraged hydration and to eat a good meal prior to next BPP.   Lauraine PARAS Lidiya Reise, CNM  03/15/23  7:01 PM

## 2023-03-15 NOTE — Progress Notes (Signed)
 ROB [redacted]w[redacted]d: She is doing well. She reports good fetal movement and has no new concerns today. BPP is 4 out 8.

## 2023-03-18 ENCOUNTER — Ambulatory Visit (INDEPENDENT_AMBULATORY_CARE_PROVIDER_SITE_OTHER): Payer: Medicaid Other

## 2023-03-18 ENCOUNTER — Other Ambulatory Visit: Payer: Self-pay | Admitting: Obstetrics and Gynecology

## 2023-03-18 DIAGNOSIS — O365930 Maternal care for other known or suspected poor fetal growth, third trimester, not applicable or unspecified: Secondary | ICD-10-CM

## 2023-03-18 DIAGNOSIS — Z3A38 38 weeks gestation of pregnancy: Secondary | ICD-10-CM

## 2023-03-21 ENCOUNTER — Encounter
Admission: RE | Admit: 2023-03-21 | Discharge: 2023-03-21 | Disposition: A | Discharge status: Home / Self Care | Payer: Medicaid Other | Source: Ambulatory Visit | Attending: Obstetrics and Gynecology | Admitting: Obstetrics and Gynecology

## 2023-03-21 DIAGNOSIS — Z01812 Encounter for preprocedural laboratory examination: Secondary | ICD-10-CM | POA: Diagnosis present

## 2023-03-21 DIAGNOSIS — Z8759 Personal history of other complications of pregnancy, childbirth and the puerperium: Secondary | ICD-10-CM | POA: Insufficient documentation

## 2023-03-21 LAB — BASIC METABOLIC PANEL
Anion gap: 10 (ref 5–15)
BUN: 5 mg/dL — ABNORMAL LOW (ref 6–20)
CO2: 22 mmol/L (ref 22–32)
Calcium: 8.2 mg/dL — ABNORMAL LOW (ref 8.9–10.3)
Chloride: 102 mmol/L (ref 98–111)
Creatinine, Ser: 0.48 mg/dL (ref 0.44–1.00)
GFR, Estimated: >60 mL/min (ref >60)
Glucose, Bld: 88 mg/dL (ref 70–99)
Potassium: 3.1 mmol/L — ABNORMAL LOW (ref 3.5–5.1)
Sodium: 134 mmol/L — ABNORMAL LOW (ref 135–145)

## 2023-03-21 LAB — CBC
HCT: 32.2 % — ABNORMAL LOW (ref 36.0–46.0)
Hemoglobin: 10.7 g/dL — ABNORMAL LOW (ref 12.0–15.0)
MCH: 28.4 pg (ref 26.0–34.0)
MCHC: 33.2 g/dL (ref 30.0–36.0)
MCV: 85.4 fL (ref 80.0–100.0)
Platelets: 254 10*3/uL (ref 150–400)
RBC: 3.77 MIL/uL — ABNORMAL LOW (ref 3.87–5.11)
RDW: 13.3 % (ref 11.5–15.5)
WBC: 9 10*3/uL (ref 4.0–10.5)
nRBC: 0 % (ref 0.0–0.2)

## 2023-03-21 LAB — TYPE AND SCREEN
ABO/RH(D): O POS
Antibody Screen: NEGATIVE
Extend sample reason: UNDETERMINED

## 2023-03-21 NOTE — Progress Notes (Signed)
  [facility] Perioperative Services: Pre-Admission/Anesthesia Testing  Abnormal Lab Notification and Treatment Plan of Care   Date: 03/21/23  Name: [name] MRN:   [id number]  Re: Abnormal labs noted during PAT appointment   Notified:  Provider Name Provider Role Notification Mode  Hildred Laser, MD OB/GYN (Surgeon) Routed and/or faxed via [facility]   Clinical Information and Notes:  ABNORMAL LAB VALUE(S): Lab Results  Component Value Date   K 3.1 (L) 03/21/2023   [name] is scheduled for an elective REPEAT CESAREAN SECTION WITH BILATERAL TUBAL LIGATION on 03/22/2023. In review of her medication reconciliation, it is noted that the patient is not taking prescribed diuretic medications. Likely nutritional + physiological related to 3rd trimester pregnancy.   Please note, in efforts to promote a safe and effective anesthetic course, per current guidelines/standards set by the [facility] anesthesia team, the minimal acceptable K+ level for the patient to proceed with general anesthesia is 3.0 mmol/L.   Sending result to surgeon for review and optimization as deemed appropriate.   Quentin Mulling, MSN, APRN, FNP-C, CEN [facility]  Perioperative Services Nurse Practitioner Phone: [phone] 03/21/23 3:29 PM  NOTE: This note has been prepared using Dragon dictation software. Despite my best ability to proofread, there is always the potential that unintentional transcriptional errors may still occur from this process.

## 2023-03-22 ENCOUNTER — Inpatient Hospital Stay
Admission: RE | Admit: 2023-03-22 | Discharge: 2023-03-24 | DRG: 785 | Disposition: A | Discharge status: Home / Self Care | Payer: Medicaid Other | Attending: Obstetrics and Gynecology | Admitting: Obstetrics and Gynecology

## 2023-03-22 ENCOUNTER — Inpatient Hospital Stay: Payer: Medicaid Other | Admitting: Urgent Care

## 2023-03-22 ENCOUNTER — Encounter: Payer: Self-pay | Admitting: Obstetrics and Gynecology

## 2023-03-22 ENCOUNTER — Other Ambulatory Visit: Payer: Self-pay

## 2023-03-22 ENCOUNTER — Encounter
Admission: RE | Disposition: A | Discharge status: Home / Self Care | Payer: Self-pay | Source: Home / Self Care | Attending: Obstetrics and Gynecology

## 2023-03-22 ENCOUNTER — Inpatient Hospital Stay: Payer: Medicaid Other | Admitting: Registered Nurse

## 2023-03-22 DIAGNOSIS — Z3A39 39 weeks gestation of pregnancy: Secondary | ICD-10-CM

## 2023-03-22 DIAGNOSIS — O09899 Supervision of other high risk pregnancies, unspecified trimester: Secondary | ICD-10-CM

## 2023-03-22 DIAGNOSIS — O9902 Anemia complicating childbirth: Secondary | ICD-10-CM | POA: Diagnosis not present

## 2023-03-22 DIAGNOSIS — Z91040 Latex allergy status: Secondary | ICD-10-CM | POA: Diagnosis not present

## 2023-03-22 DIAGNOSIS — Z87891 Personal history of nicotine dependence: Secondary | ICD-10-CM

## 2023-03-22 DIAGNOSIS — D509 Iron deficiency anemia, unspecified: Secondary | ICD-10-CM | POA: Diagnosis present

## 2023-03-22 DIAGNOSIS — Z560 Unemployment, unspecified: Secondary | ICD-10-CM | POA: Diagnosis not present

## 2023-03-22 DIAGNOSIS — Z8759 Personal history of other complications of pregnancy, childbirth and the puerperium: Secondary | ICD-10-CM

## 2023-03-22 DIAGNOSIS — O365939 Maternal care for other known or suspected poor fetal growth, third trimester, other fetus: Secondary | ICD-10-CM | POA: Diagnosis present

## 2023-03-22 DIAGNOSIS — Z302 Encounter for sterilization: Secondary | ICD-10-CM | POA: Diagnosis not present

## 2023-03-22 DIAGNOSIS — O34211 Maternal care for low transverse scar from previous cesarean delivery: Secondary | ICD-10-CM | POA: Diagnosis not present

## 2023-03-22 DIAGNOSIS — Z8249 Family history of ischemic heart disease and other diseases of the circulatory system: Secondary | ICD-10-CM

## 2023-03-22 DIAGNOSIS — Z9851 Tubal ligation status: Secondary | ICD-10-CM

## 2023-03-22 DIAGNOSIS — Z888 Allergy status to other drugs, medicaments and biological substances status: Secondary | ICD-10-CM

## 2023-03-22 DIAGNOSIS — O0990 Supervision of high risk pregnancy, unspecified, unspecified trimester: Principal | ICD-10-CM

## 2023-03-22 DIAGNOSIS — Z98891 History of uterine scar from previous surgery: Secondary | ICD-10-CM

## 2023-03-22 DIAGNOSIS — O365930 Maternal care for other known or suspected poor fetal growth, third trimester, not applicable or unspecified: Secondary | ICD-10-CM | POA: Diagnosis present

## 2023-03-22 DIAGNOSIS — Z833 Family history of diabetes mellitus: Secondary | ICD-10-CM

## 2023-03-22 DIAGNOSIS — O09893 Supervision of other high risk pregnancies, third trimester: Secondary | ICD-10-CM | POA: Diagnosis not present

## 2023-03-22 LAB — URINE DRUG SCREEN, QUALITATIVE (ARMC ONLY)
Amphetamines, Ur Screen: NOT DETECTED
Barbiturates, Ur Screen: NOT DETECTED
Benzodiazepine, Ur Scrn: NOT DETECTED
Cannabinoid 50 Ng, Ur ~~LOC~~: POSITIVE — AB
Cocaine Metabolite,Ur ~~LOC~~: NOT DETECTED
MDMA (Ecstasy)Ur Screen: NOT DETECTED
Methadone Scn, Ur: NOT DETECTED
Opiate, Ur Screen: NOT DETECTED
Phencyclidine (PCP) Ur S: NOT DETECTED
Tricyclic, Ur Screen: NOT DETECTED

## 2023-03-22 LAB — CHLAMYDIA/NGC RT PCR (ARMC ONLY)
Chlamydia Tr: NOT DETECTED
N gonorrhoeae: NOT DETECTED

## 2023-03-22 LAB — RPR: RPR Ser Ql: NONREACTIVE

## 2023-03-22 LAB — GROUP B STREP BY PCR: Group B strep by PCR: NEGATIVE

## 2023-03-22 SURGERY — Surgical Case
Anesthesia: Spinal

## 2023-03-22 MED ORDER — DIPHENHYDRAMINE HCL 25 MG PO CAPS
25.0000 mg | ORAL_CAPSULE | Freq: Four times a day (QID) | ORAL | Status: DC | PRN
Start: 2023-03-22 — End: 2023-03-24

## 2023-03-22 MED ORDER — DIBUCAINE (PERIANAL) 1 % EX OINT: 1.0000 | TOPICAL_OINTMENT | CUTANEOUS | Status: DC | PRN

## 2023-03-22 MED ORDER — NALOXONE HCL 0.4 MG/ML IJ SOLN: 0.4000 mg | INTRAMUSCULAR | Status: DC | PRN

## 2023-03-22 MED ORDER — GABAPENTIN 100 MG PO CAPS
100.0000 mg | ORAL_CAPSULE | Freq: Three times a day (TID) | ORAL | Status: DC
  Administered 2023-03-22 – 2023-03-24 (×6): 100 mg via ORAL
  Filled 2023-03-22 (×6): qty 1

## 2023-03-22 MED ORDER — WITCH HAZEL-GLYCERIN EX PADS: 1.0000 | MEDICATED_PAD | CUTANEOUS | Status: DC | PRN

## 2023-03-22 MED ORDER — ONDANSETRON HCL 4 MG/2ML IJ SOLN: 4.0000 mg | Freq: Three times a day (TID) | INTRAMUSCULAR | Status: DC | PRN

## 2023-03-22 MED ORDER — OXYTOCIN-SODIUM CHLORIDE 30-0.9 UT/500ML-% IV SOLN
INTRAVENOUS | Status: AC
  Filled 2023-03-22: qty 500

## 2023-03-22 MED ORDER — DROPERIDOL 2.5 MG/ML IJ SOLN: 0.6250 mg | Freq: Once | INTRAMUSCULAR | Status: DC | PRN

## 2023-03-22 MED ORDER — BUPIVACAINE HCL (PF) 0.5 % IJ SOLN
INTRAMUSCULAR | Status: AC
  Filled 2023-03-22: qty 30

## 2023-03-22 MED ORDER — KETOROLAC TROMETHAMINE 30 MG/ML IJ SOLN
30.0000 mg | Freq: Four times a day (QID) | INTRAMUSCULAR | Status: AC | PRN
Start: 2023-03-22 — End: 2023-03-23
  Filled 2023-03-22: qty 1

## 2023-03-22 MED ORDER — OXYCODONE HCL 5 MG PO TABS: 5.0000 mg | ORAL_TABLET | Freq: Four times a day (QID) | ORAL | Status: DC | PRN

## 2023-03-22 MED ORDER — NALOXONE HCL 4 MG/10ML IJ SOLN
1.0000 ug/kg/h | INTRAVENOUS | Status: DC | PRN
  Filled 2023-03-22: qty 5

## 2023-03-22 MED ORDER — LACTATED RINGERS IV SOLN: Freq: Once | INTRAVENOUS | Status: AC

## 2023-03-22 MED ORDER — LACTATED RINGERS IV SOLN: INTRAVENOUS | Status: DC

## 2023-03-22 MED ORDER — FENTANYL CITRATE (PF) 100 MCG/2ML IJ SOLN: 25.0000 ug | INTRAMUSCULAR | Status: DC | PRN

## 2023-03-22 MED ORDER — OXYTOCIN-SODIUM CHLORIDE 30-0.9 UT/500ML-% IV SOLN
INTRAVENOUS | Status: DC | PRN
  Administered 2023-03-22: 30 [IU] via INTRAVENOUS

## 2023-03-22 MED ORDER — SOD CITRATE-CITRIC ACID 500-334 MG/5ML PO SOLN
ORAL | Status: AC
  Filled 2023-03-22: qty 15

## 2023-03-22 MED ORDER — SCOPOLAMINE 1 MG/3DAYS TD PT72: 1.0000 | MEDICATED_PATCH | Freq: Once | TRANSDERMAL | Status: DC

## 2023-03-22 MED ORDER — SODIUM CHLORIDE 0.9% FLUSH: 3.0000 mL | INTRAVENOUS | Status: DC | PRN

## 2023-03-22 MED ORDER — ORAL CARE MOUTH RINSE: 15.0000 mL | Freq: Once | OROMUCOSAL | Status: AC

## 2023-03-22 MED ORDER — ONDANSETRON HCL 4 MG/2ML IJ SOLN
INTRAMUSCULAR | Status: DC | PRN
  Administered 2023-03-22: 4 mg via INTRAVENOUS

## 2023-03-22 MED ORDER — OXYCODONE HCL 5 MG/5ML PO SOLN: 5.0000 mg | Freq: Once | ORAL | Status: DC | PRN

## 2023-03-22 MED ORDER — EPHEDRINE 5 MG/ML INJ
INTRAVENOUS | Status: AC
  Filled 2023-03-22: qty 5

## 2023-03-22 MED ORDER — PRENATAL MULTIVITAMIN CH
1.0000 | ORAL_TABLET | Freq: Every day | ORAL | Status: DC
  Administered 2023-03-23: 1 via ORAL
  Filled 2023-03-22: qty 1

## 2023-03-22 MED ORDER — KETOROLAC TROMETHAMINE 30 MG/ML IJ SOLN
30.0000 mg | Freq: Four times a day (QID) | INTRAMUSCULAR | Status: AC | PRN
Start: 2023-03-22 — End: 2023-03-23

## 2023-03-22 MED ORDER — KETOROLAC TROMETHAMINE 30 MG/ML IJ SOLN
30.0000 mg | Freq: Four times a day (QID) | INTRAMUSCULAR | Status: AC
  Administered 2023-03-22 – 2023-03-23 (×4): 30 mg via INTRAVENOUS
  Filled 2023-03-22 (×3): qty 1

## 2023-03-22 MED ORDER — ACETAMINOPHEN 10 MG/ML IV SOLN
1000.0000 mg | Freq: Once | INTRAVENOUS | Status: DC | PRN
Start: 2023-03-22 — End: 2023-03-22

## 2023-03-22 MED ORDER — 0.9 % SODIUM CHLORIDE (POUR BTL) OPTIME
TOPICAL | Status: DC | PRN
  Administered 2023-03-22: 200 mL

## 2023-03-22 MED ORDER — MORPHINE SULFATE (PF) 0.5 MG/ML IJ SOLN
INTRAMUSCULAR | Status: AC
  Filled 2023-03-22: qty 10

## 2023-03-22 MED ORDER — MORPHINE SULFATE (PF) 0.5 MG/ML IJ SOLN
INTRAMUSCULAR | Status: DC | PRN
  Administered 2023-03-22: .1 mg via INTRATHECAL

## 2023-03-22 MED ORDER — LIDOCAINE 5 % EX PTCH
MEDICATED_PATCH | CUTANEOUS | Status: DC | PRN
  Administered 2023-03-22: 1 via TRANSDERMAL

## 2023-03-22 MED ORDER — TRANEXAMIC ACID-NACL 1000-0.7 MG/100ML-% IV SOLN
INTRAVENOUS | Status: AC
  Filled 2023-03-22: qty 100

## 2023-03-22 MED ORDER — SOD CITRATE-CITRIC ACID 500-334 MG/5ML PO SOLN
30.0000 mL | ORAL | Status: AC
  Administered 2023-03-22: 30 mL via ORAL

## 2023-03-22 MED ORDER — FENTANYL CITRATE (PF) 100 MCG/2ML IJ SOLN
INTRAMUSCULAR | Status: AC
  Filled 2023-03-22: qty 2

## 2023-03-22 MED ORDER — DIPHENHYDRAMINE HCL 50 MG/ML IJ SOLN: 12.5000 mg | INTRAMUSCULAR | Status: DC | PRN

## 2023-03-22 MED ORDER — COCONUT OIL OIL: 1.0000 | TOPICAL_OIL | Status: DC | PRN

## 2023-03-22 MED ORDER — MENTHOL 3 MG MT LOZG: 1.0000 | LOZENGE | OROMUCOSAL | Status: DC | PRN

## 2023-03-22 MED ORDER — KETOROLAC TROMETHAMINE 30 MG/ML IJ SOLN
INTRAMUSCULAR | Status: DC | PRN
  Administered 2023-03-22: 30 mg via INTRAVENOUS

## 2023-03-22 MED ORDER — IBUPROFEN 600 MG PO TABS
600.0000 mg | ORAL_TABLET | Freq: Four times a day (QID) | ORAL | Status: DC
  Administered 2023-03-23 – 2023-03-24 (×3): 600 mg via ORAL
  Filled 2023-03-22 (×3): qty 1

## 2023-03-22 MED ORDER — DIPHENHYDRAMINE HCL 25 MG PO CAPS: 25.0000 mg | ORAL_CAPSULE | ORAL | Status: DC | PRN

## 2023-03-22 MED ORDER — OXYTOCIN-SODIUM CHLORIDE 30-0.9 UT/500ML-% IV SOLN: 2.5000 [IU]/h | INTRAVENOUS | Status: AC

## 2023-03-22 MED ORDER — EPHEDRINE SULFATE-NACL 50-0.9 MG/10ML-% IV SOSY
PREFILLED_SYRINGE | INTRAVENOUS | Status: DC | PRN
  Administered 2023-03-22: 5 mg via INTRAVENOUS

## 2023-03-22 MED ORDER — CEFAZOLIN SODIUM-DEXTROSE 2-4 GM/100ML-% IV SOLN
2.0000 g | INTRAVENOUS | Status: AC
  Administered 2023-03-22: 2 g via INTRAVENOUS
  Filled 2023-03-22: qty 100

## 2023-03-22 MED ORDER — KETOROLAC TROMETHAMINE 30 MG/ML IJ SOLN
INTRAMUSCULAR | Status: AC
  Filled 2023-03-22: qty 1

## 2023-03-22 MED ORDER — PHENYLEPHRINE HCL-NACL 20-0.9 MG/250ML-% IV SOLN
INTRAVENOUS | Status: AC
  Filled 2023-03-22: qty 250

## 2023-03-22 MED ORDER — ACETAMINOPHEN 500 MG PO TABS
1000.0000 mg | ORAL_TABLET | Freq: Four times a day (QID) | ORAL | Status: DC
  Administered 2023-03-22: 1000 mg via ORAL
  Filled 2023-03-22: qty 2

## 2023-03-22 MED ORDER — FENTANYL CITRATE (PF) 100 MCG/2ML IJ SOLN
INTRAMUSCULAR | Status: DC | PRN
  Administered 2023-03-22: 15 ug via INTRATHECAL

## 2023-03-22 MED ORDER — ZOLPIDEM TARTRATE 5 MG PO TABS: 5.0000 mg | ORAL_TABLET | Freq: Every evening | ORAL | Status: DC | PRN

## 2023-03-22 MED ORDER — OXYCODONE HCL 5 MG PO TABS
5.0000 mg | ORAL_TABLET | ORAL | Status: DC | PRN
  Administered 2023-03-22: 5 mg via ORAL
  Administered 2023-03-22: 10 mg via ORAL
  Administered 2023-03-23: 5 mg via ORAL
  Administered 2023-03-23 – 2023-03-24 (×6): 10 mg via ORAL
  Filled 2023-03-22 (×9): qty 2

## 2023-03-22 MED ORDER — SENNOSIDES-DOCUSATE SODIUM 8.6-50 MG PO TABS
2.0000 | ORAL_TABLET | Freq: Every day | ORAL | Status: DC
  Administered 2023-03-23 – 2023-03-24 (×2): 2 via ORAL
  Filled 2023-03-22 (×2): qty 2

## 2023-03-22 MED ORDER — ACETAMINOPHEN 500 MG PO TABS
1000.0000 mg | ORAL_TABLET | Freq: Four times a day (QID) | ORAL | Status: DC
  Administered 2023-03-22 – 2023-03-24 (×7): 1000 mg via ORAL
  Filled 2023-03-22 (×7): qty 2

## 2023-03-22 MED ORDER — OXYCODONE HCL 5 MG PO TABS: 5.0000 mg | ORAL_TABLET | Freq: Once | ORAL | Status: DC | PRN

## 2023-03-22 MED ORDER — GABAPENTIN 300 MG PO CAPS
300.0000 mg | ORAL_CAPSULE | ORAL | Status: AC
  Administered 2023-03-22: 300 mg via ORAL
  Filled 2023-03-22: qty 1

## 2023-03-22 MED ORDER — SIMETHICONE 80 MG PO CHEW
80.0000 mg | CHEWABLE_TABLET | Freq: Three times a day (TID) | ORAL | Status: DC
  Administered 2023-03-23 – 2023-03-24 (×4): 80 mg via ORAL
  Filled 2023-03-22 (×5): qty 1

## 2023-03-22 MED ORDER — ACETAMINOPHEN 500 MG PO TABS
1000.0000 mg | ORAL_TABLET | ORAL | Status: AC
  Administered 2023-03-22: 1000 mg via ORAL
  Filled 2023-03-22: qty 2

## 2023-03-22 MED ORDER — LIDOCAINE 5 % EX PTCH: 1.0000 | MEDICATED_PATCH | CUTANEOUS | Status: DC

## 2023-03-22 MED ORDER — CHLORHEXIDINE GLUCONATE 0.12 % MT SOLN
15.0000 mL | Freq: Once | OROMUCOSAL | Status: AC
  Administered 2023-03-22: 15 mL via OROMUCOSAL
  Filled 2023-03-22: qty 15

## 2023-03-22 MED ORDER — BUPIVACAINE IN DEXTROSE 0.75-8.25 % IT SOLN
INTRATHECAL | Status: DC | PRN
  Administered 2023-03-22: 1.45 mL via INTRATHECAL

## 2023-03-22 MED ORDER — PHENYLEPHRINE HCL-NACL 20-0.9 MG/250ML-% IV SOLN
INTRAVENOUS | Status: DC | PRN
  Administered 2023-03-22: 50 ug/min via INTRAVENOUS

## 2023-03-22 MED ORDER — LIDOCAINE 5 % EX PTCH
MEDICATED_PATCH | CUTANEOUS | Status: AC
  Administered 2023-03-22: 1 via TRANSDERMAL
  Filled 2023-03-22: qty 1

## 2023-03-22 MED ORDER — DEXAMETHASONE SODIUM PHOSPHATE 10 MG/ML IJ SOLN
INTRAMUSCULAR | Status: DC | PRN
  Administered 2023-03-22: 5 mg via INTRAVENOUS

## 2023-03-22 MED ORDER — SIMETHICONE 80 MG PO CHEW: 80.0000 mg | CHEWABLE_TABLET | ORAL | Status: DC | PRN

## 2023-03-22 MED ORDER — MEPERIDINE HCL 25 MG/ML IJ SOLN: 6.2500 mg | INTRAMUSCULAR | Status: DC | PRN

## 2023-03-22 MED ORDER — POVIDONE-IODINE 10 % EX SWAB
2.0000 | Freq: Once | CUTANEOUS | Status: AC
  Administered 2023-03-22: 2 via TOPICAL

## 2023-03-22 MED ORDER — METHYLERGONOVINE MALEATE 0.2 MG/ML IJ SOLN
INTRAMUSCULAR | Status: AC
  Filled 2023-03-22: qty 1

## 2023-03-22 SURGICAL SUPPLY — 28 items
BAG COUNTER SPONGE SURGICOUNT (BAG) ×1 IMPLANT
BENZOIN TINCTURE PRP APPL 2/3 (GAUZE/BANDAGES/DRESSINGS) IMPLANT
BNDG TENSOPLAST 6X5 (GAUZE/BANDAGES/DRESSINGS) IMPLANT
CHLORAPREP W/TINT 26 (MISCELLANEOUS) ×2 IMPLANT
DRSG TELFA 3X8 NADH (GAUZE/BANDAGES/DRESSINGS) ×1
DRSG TELFA 3X8 NADH STRL (GAUZE/BANDAGES/DRESSINGS) ×1 IMPLANT
ELECT REM PT RETURN 9FT ADLT (ELECTROSURGICAL) ×1
ELECTRODE REM PT RTRN 9FT ADLT (ELECTROSURGICAL) ×1 IMPLANT
GAUZE SPONGE 4X4 12PLY STRL (GAUZE/BANDAGES/DRESSINGS) ×1 IMPLANT
GLOVE BIO SURGEON STRL SZ 6.5 (GLOVE) ×1 IMPLANT
GLOVE INDICATOR 7.0 STRL GRN (GLOVE) ×1 IMPLANT
GOWN STRL REUS W/ TWL LRG LVL3 (GOWN DISPOSABLE) ×2 IMPLANT
KIT TURNOVER KIT A (KITS) ×1 IMPLANT
MANIFOLD NEPTUNE II (INSTRUMENTS) ×1 IMPLANT
MAT PREVALON FULL STRYKER (MISCELLANEOUS) ×1 IMPLANT
NS IRRIG 1000ML POUR BTL (IV SOLUTION) ×1 IMPLANT
PACK C SECTION AR (MISCELLANEOUS) ×1 IMPLANT
PAD DRESSING TELFA 3X8 NADH (GAUZE/BANDAGES/DRESSINGS) IMPLANT
PAD OB MATERNITY 11 LF (PERSONAL CARE ITEMS) ×1 IMPLANT
PAD PREP OB/GYN DISP 24X41 (PERSONAL CARE ITEMS) ×1 IMPLANT
RETRACTOR TRAXI PANNICULUS (MISCELLANEOUS) IMPLANT
SCRUB CHG 4% DYNA-HEX 4OZ (MISCELLANEOUS) ×1 IMPLANT
STRIP CLOSURE SKIN 1/2X4 (GAUZE/BANDAGES/DRESSINGS) IMPLANT
SUT MNCRL AB 4-0 PS2 18 (SUTURE) ×1 IMPLANT
SUT VIC AB 0 CT1 36 (SUTURE) ×4 IMPLANT
SUT VIC AB 3-0 SH 27X BRD (SUTURE) ×1 IMPLANT
TRAP FLUID SMOKE EVACUATOR (MISCELLANEOUS) ×1 IMPLANT
WATER STERILE IRR 500ML POUR (IV SOLUTION) ×1 IMPLANT

## 2023-03-22 NOTE — H&P (Signed)
Obstetric Preoperative History and Physical  [name] is a 28 y.o. [phone] with IUP at [redacted]w[redacted]d presenting for presenting for scheduled repeat cesarean section with bilateral tubal ligation.  Concerns for this pregnancy are SGA infant (11%ile) with decreased AFI (6.4 cm), history of previous C-section x 3, poor dentition.   Prenatal Course Source of Care: [facility] with onset of care at 7 weeks Pregnancy complications or risks: Patient Active Problem List   Diagnosis Date Noted   Labor and delivery indication for care or intervention 03/15/2023   Poor dentition 03/08/2023   SGA (small for gestational age), fetal, affecting care of mother, antepartum, third trimester, other fetus 02/24/2023   Flu vaccine refused 12/09/2022   History of prior pregnancy with IUGR newborn 10/01/2022   History of gestational hypertension 10/01/2022   Short interval between pregnancies affecting pregnancy, antepartum 10/01/2022   Supervision of high risk pregnancy, antepartum 08/11/2022   Postpartum hypertension 04/15/2022   Biliary colic 07/04/2019   History of 3 cesarean sections 11/29/2016   Depressive disorder 07/19/2010   Extrinsic asthma 04/03/2009   She plans to bottle feed She desires bilateral tubal ligation for postpartum contraception.   Prenatal labs and studies: ABO, Rh: --/--/O POS (01/20 1134) Antibody: NEG (01/20 1134) Rubella: 1.30 (08/02 1106) RPR: Non Reactive (11/06 0955)  HBsAg: Negative (08/02 1106)  HIV: Non Reactive (11/06 0955)  GEX:[id number] NEGATIVE/-- (01/21 0636) 1 hr Glucola  normal (65) Genetic screening normal Anatomy US normal   Past Medical History:  Diagnosis Date   Abdominal pain    Anxiety    Asthma    Depression    Gestational hypertension    Nausea    Peptic ulcer disease    PONV (postoperative nausea and vomiting)    after 1st C-Section   Pre-diabetes (history)    better with weight loss    Past Surgical History:  Procedure  Laterality Date   CESAREAN SECTION N/A 11/29/2016   Procedure: CESAREAN SECTION;  Surgeon: Herold Harms, MD;  Location: [facility] ORS;  Service: Obstetrics;  Laterality: N/A;   CESAREAN SECTION N/A 12/30/2017   Procedure: REPEAT CESAREAN SECTION;  Surgeon: Hildred Laser, MD;  Location: [facility] ORS;  Service: Obstetrics;  Laterality: N/A;   CESAREAN SECTION N/A 02/24/2022   Procedure: CESAREAN SECTION;  Surgeon: Hildred Laser, MD;  Location: [facility] OR[facility];  Service: Obstetrics;  Laterality: N/A;   LAPAROSCOPIC CHOLECYSTECTOMY  07/10/2019   TONSILLECTOMY AND ADENOIDECTOMY  2010   WISDOM TOOTH EXTRACTION      OB History  Gravida Para Term Preterm AB Living  7 3 3  3 3   SAB IAB Ectopic Multiple Live Births  3   0 3    # Outcome Date GA Lbr Len/2nd Weight Sex Type Anes PTL Lv  7 Current           6 Term 02/24/22 [redacted]w[redacted]d  2030 g F CS-LTranv Spinal  LIV  5 Term 12/30/17 [redacted]w[redacted]d  3040 g F CS-LTranv Spinal  LIV  4 Term 11/29/16 [redacted]w[redacted]d  3020 g M CS-LTranv EPI  LIV  3 SAB 2015        ND  2 SAB 2012        ND  1 SAB      SAB       Social History   Socioeconomic History   Marital status: Married    Spouse name: Molli Hazard   Number of children: 3   Years of education: 12   Highest education level: GED  or equivalent  Occupational History   Occupation: unemployed  Tobacco Use   Smoking status: Former    Current packs/day: 0.50    Types: Cigarettes   Smokeless tobacco: Never  Vaping Use   Vaping status: Never Used  Substance and Sexual Activity   Alcohol use: No   Drug use: Not Currently    Comment: Denies current use    Sexual activity: Yes    Partners: Male    Birth control/protection: Surgical    Comment: planning tubal  Other Topics Concern   Not on file  Social History Narrative   Lives with her kids and parents   Right Handed   Drinks 1-2 cups caffeine daily   Social Drivers of Health   Financial Resource Strain: Low Risk  (08/11/2022)   Overall Financial Resource Strain  (CARDIA)    Difficulty of Paying Living Expenses: Not very hard  Food Insecurity: No Food Insecurity (03/22/2023)   Hunger Vital Sign    Worried About Running Out of Food in the Last Year: Never true    Ran Out of Food in the Last Year: Never true  Transportation Needs: No Transportation Needs (03/22/2023)   PRAPARE - Administrator, Civil Service (Medical): No    Lack of Transportation (Non-Medical): No  Physical Activity: Inactive (08/11/2022)   Exercise Vital Sign    Days of Exercise per Week: 0 days    Minutes of Exercise per Session: 0 min  Stress: No Stress Concern Present (08/11/2022)   [employer] of Occupational Health - Occupational Stress Questionnaire    Feeling of Stress : Not at all  Social Connections: Moderately Integrated (08/11/2022)   Social Connection and Isolation Panel [NHANES]    Frequency of Communication with Friends and Family: More than three times a week    Frequency of Social Gatherings with Friends and Family: Twice a week    Attends Religious Services: More than 4 times per year    Active Member of [employer] or Organizations: No    Attends Engineer, structural: Never    Marital Status: Married    Family History  Problem Relation Age of Onset   Cholelithiasis Mother    Other Mother        myasthenia gravis, enlarged heart colapsed lung, restless legs   Anemia Mother    Hypertension Father    Healthy Sister    Healthy Sister    Healthy Sister    Healthy Sister    SIDS Brother    Healthy Brother    Cholelithiasis Maternal Grandmother    Ulcers Maternal Grandmother    Epilepsy Maternal Grandmother    Diabetes Maternal Grandfather    Heart Problems Maternal Grandfather        has pacemaker   Other Paternal Grandmother        back issues   Celiac disease Neg Hx     Medications Prior to Admission  Medication Sig Dispense Refill Last Dose/Taking   acetaminophen (TYLENOL) 500 MG tablet Take 1,000 mg by mouth every 6 (six)  hours as needed for moderate pain (pain score 4-6).   03/21/2023   benzocaine (ORAJEL) 10 % mucosal gel Use as directed 1 Application in the mouth or throat as needed for mouth pain.   03/21/2023   oxyCODONE-acetaminophen (PERCOCET/ROXICET) 5-325 MG tablet Take 1-2 tablets by mouth every 6 (six) hours as needed for severe pain (pain score 7-10). 30 tablet 0 03/21/2023   Prenatal Vit-Fe Fumarate-FA (PRENATAL PO) Take  1 tablet by mouth daily.   03/21/2023    Allergies  Allergen Reactions   Latex Other (See Comments)    Burns her skin   Metformin And Related Shortness Of Breath   Sprintec 28 [Norgestimate-Eth Estradiol] Hives    Review of Systems: Negative except for what is mentioned in HPI.  Physical Exam: BP 122/76 (BP Location: Right Arm)   Pulse 97   Temp 98.1 F (36.7 C) (Oral)   Resp 18   Ht 4\' 11"  (1.499 m)   Wt 65.3 kg   LMP 06/22/2022 (Exact Date)   BMI 29.08 kg/m  FHR by Doppler: 135 bpm GENERAL: Well-developed, well-nourished female in no acute distress.  LUNGS: Clear to auscultation bilaterally.  HEART: Regular rate and rhythm. ABDOMEN: Soft, nontender, nondistended, gravid, well-healed Pfannenstiel incision. PELVIC: Deferred EXTREMITIES: Nontender, no edema, 2+ distal pulses.   Pertinent Labs/Studies:   Results for orders placed or performed during the hospital encounter of 03/22/23 (from the past 72 hours)  Urine Drug Screen, Qualitative ([facility] only)     Status: Abnormal   Collection Time: 03/22/23  5:55 AM  Result Value Ref Range   Tricyclic, Ur Screen NONE DETECTED NONE DETECTED   Amphetamines, Ur Screen NONE DETECTED NONE DETECTED   MDMA (Ecstasy)Ur Screen NONE DETECTED NONE DETECTED   Cocaine Metabolite,Ur [place] NONE DETECTED NONE DETECTED   Opiate, Ur Screen NONE DETECTED NONE DETECTED   Phencyclidine (PCP) Ur S NONE DETECTED NONE DETECTED   Cannabinoid 50 Ng, Ur [place] POSITIVE (A) NONE DETECTED   Barbiturates, Ur Screen NONE DETECTED NONE DETECTED    Benzodiazepine, Ur Scrn NONE DETECTED NONE DETECTED   Methadone Scn, Ur NONE DETECTED NONE DETECTED    Comment: (NOTE) Tricyclics + metabolites, urine    Cutoff 1000 ng/mL Amphetamines + metabolites, urine  Cutoff 1000 ng/mL MDMA (Ecstasy), urine              Cutoff 500 ng/mL Cocaine Metabolite, urine          Cutoff 300 ng/mL Opiate + metabolites, urine        Cutoff 300 ng/mL Phencyclidine (PCP), urine         Cutoff 25 ng/mL Cannabinoid, urine                 Cutoff 50 ng/mL Barbiturates + metabolites, urine  Cutoff 200 ng/mL Benzodiazepine, urine              Cutoff 200 ng/mL Methadone, urine                   Cutoff 300 ng/mL  The urine drug screen provides only a preliminary, unconfirmed analytical test result and should not be used for non-medical purposes. Clinical consideration and professional judgment should be applied to any positive drug screen result due to possible interfering substances. A more specific alternate chemical method must be used in order to obtain a confirmed analytical result. Gas chromatography / mass spectrometry (GC/MS) is the preferred confirm atory method. Performed at [facility], [address] [place], [place] [address]   Group B strep by PCR     Status: None   Collection Time: 03/22/23  6:36 AM   Specimen: Vaginal/Rectal; Genital  Result Value Ref Range   Group B strep by PCR PRESUMPTIVE NEGATIVE PRESUMPTIVE NEGATIVE    Comment: (NOTE) Intrapartum testing with Xpert Xpress GBS assay should be used as an adjunct to other methods available and not used to replace antepartum testing (at 35-[redacted] weeks  gestation).  A GBS PRESUMPTIVE NEGATIVE should be interpreted as: Patient may or may not be colonized with GBS. A GBS presumptive negative result does not exclude the possibility of GBS colonization. Providers should consider new risk factors, if applicable, and clinical guidance regarding a role for intrapartum  prophylaxis.  Recommend culture for confirmation of GBS colonization is clinically indicated.  Performed at [facility], [address], [place], [place] [address]      Imaging:  Study Result  Narrative & Impression    ----------------------------------------------------------------------  OBSTETRICS REPORT                       (Signed Final 03/01/2023 11:47 am) ---------------------------------------------------------------------- Patient Info    ID #:       [id number]                          D.O.B.:  1995-06-22 (27 yrs)(F)  Name:       [name]            Visit Date: 03/01/2023 09:00 am ---------------------------------------------------------------------- Performed By    Attending:        Noralee Space MD        Ref. Address:     [address]                                                             [place]                                                             [address]  Performed By:     Octaviano Batty BS       [facility]                                                             [facility]  Referred By:      Salomon Mast ---------------------------------------------------------------------- Orders    #  Description                           Code        Ordered By  1  [place] MFM OB DETAIL +14 WK               76811.01    Hildred Laser  2  [place] MFM UA CORD DOPPLER                76820.02    RAVI [facility] ----------------------------------------------------------------------    #  Order #                     Accession #                Episode #  1  [id number]                   [id number]                 [id number]  2  [id number]                   [id number]                  [id number] ---------------------------------------------------------------------- Indications    Maternal care for known or suspected poor      O36.5933  fetal growth, third trimester, fetus 3 IUGR  History of cesarean delivery, currently        O34.219  pregnant  History of Gestational Hypertension            O13.9  Short interval between pregnancies             O09.899  complicating pregnancy, antepartum  Encounter for antenatal screening for          Z36.3  malformations  Drug dependence complicating pregnancy,        O99.320 F19.20  antepartum condition or complication  [redacted] weeks gestation of pregnancy                Z3A.36 ---------------------------------------------------------------------- Fetal Evaluation    Num Of Fetuses:         1  Fetal Heart Rate(bpm):  138  Cardiac Activity:       Observed  Presentation:           Cephalic  Placenta:               Posterior  P. Cord Insertion:      Not well visualized    Amniotic Fluid  AFI FV:      Within normal limits    AFI Sum(cm)     %Tile       Largest Pocket(cm)  9.3             17          3.7  RUQ(cm)       RLQ(cm)       LUQ(cm)        LLQ(cm)  0             3             2.6            3.7 ---------------------------------------------------------------------- Biometry    BPD:      83.9  mm     G. Age:  33w 5d          7  %    CI:        78.89   %    70 - 86                                                          FL/HC:      21.7   %    20.1 - 22.1  HC:      298.7  mm     G. Age:  33w 1d        < 1  %    HC/AC:      0.96        0.93 - 1.11  AC:      309.8  mm     G. Age:  34w 6d         29  %    FL/BPD:     77.2   %    71 - 87  FL:       64.8  mm     G. Age:  33w 3d        2.7  %    FL/AC:      20.9   %    20 - 24  CER:      44.6  mm     G. Age:  34w 4d         17  %    LV:        3.5  mm  CM:        8.8  mm    Est. FW:    2374  gm      5 lb 4 oz     11   % ---------------------------------------------------------------------- Gestational Age    LMP:           36w 0d        Date:  06/22/22                  EDD:   03/29/23  U/S Today:     33w 6d                                        EDD:   04/13/23  Best:          36w 0d     Det. By:  LMP  (06/22/22)          EDD:   03/29/23 ---------------------------------------------------------------------- Targeted Anatomy    Central Nervous System  Calvarium/Cranial V.:  Appears normal         Cereb./Vermis:          Appears normal  Cavum:                 Appears normal         Cisterna Magna:         Appears normal  Lateral Ventricles:    Appears normal         Midline Falx:           Appears normal  Choroid Plexus:        Appears normal    Spine  Cervical:  Not well visualized    Sacral:                 Not well visualized  Thoracic:              Not well visualized    Shape/Curvature:        Not well visualized  Lumbar:                Not well visualized    Head/Neck  Lips:                  Not well visualized    Profile:                Not well visualized  Neck:                  Not well visualized    Orbits/Eyes:            Not well visualized  Nuchal Fold:           Not applicable         Mandible:               Not well visualized  Nasal Bone:            Not well visualized    Maxilla:                Not well visualized    Thorax  4 Chamber View:        Appears normal         Interventr. Septum:     Appears normal  Cardiac Rhythm:        Normal                 Cardiac Axis:           Normal  Cardiac Situs:         Appears normal         Diaphragm:              Appears normal  Rt Outflow Tract:      Not well visualized    3 Vessel View:          Appears normal  Lt Outflow Tract:      Appears normal         3 V Trachea View:       Appears normal  Aortic Arch:           Not well visualized    IVC:                    Not well visualized  Ductal Arch:           Not well visualized     Crossing:               Appears normal  SVC:                   Not well visualized    Abdomen  Ventral Wall:          Not well visualized    Lt Kidney:              Appears normal  Cord Insertion:        Not well visualized    Rt Kidney:              Appears normal  Situs:  Appears normal         Bladder:                Appears normal  Stomach:               Appears normal  Extremities  Lt Humerus:            Not well visualized    Lt Femur:               Appears normal  Rt Humerus:            Not well visualized    Rt Femur:               Appears normal  Lt Forearm:            Not well visualized    Lt Lower Leg:           Appears normal  Rt Forearm:            Not well visualized    Rt Lower Leg:           Appears normal  Lt Hand:               Not well visualized    Lt Foot:                Not well visualized  Rt Hand:               Not well visualized    Rt Foot:                Not well visualized    Other  Umbilical Cord:        Normal 3-vessel        Genitalia:              Female-nml ---------------------------------------------------------------------- Doppler - Fetal Vessels    Umbilical Artery   S/D     %tile      RI    %tile      PI    %tile     PSV                                                     (cm/s)   3.23       90    0.69       92    1.11       92    54.98   ---------------------------------------------------------------------- Cervix Uterus Adnexa    Cervix  Not visualized (advanced GA >24wks) ---------------------------------------------------------------------- Impression    G7 G6440.  Patient is here for a second opinion.  At your  office ultrasound performed on 02/15/2023, the estimated  fetal weight was at the 17th percentile and the abdominal  circumference measurement at the 5th percentile.  Obstetric history is significant for 3 term cesarean deliveries.  Her most recent pregnancy was complicated by severe fetal  growth restriction,  and she delivered a female infant weighing 4  pounds and 7 ounces at birth.  The baby was discharged  home with the mother.  Patient does not have gestational diabetes.  On cell-free fetal  DNA screening, the risks of fetal aneuploidies are not  increased.  On today's ultrasound, the estimated fetal weight is at the  11th percentile and the abdominal circumference  measurement at the 29 percentiles.  Head circumference  measurement is at between -2 and -3 SD.  Intracranial  structures appear normal.  Fetal anatomical survey appears normal but limited by  advanced gestational age.  Umbilical artery Doppler showed  normal forward diastolic flow.  Placenta is posterior and there is no evidence of previa or  placenta accreta spectrum.  I reassured the patient of normal fetal growth assessment.  However, ultrasound has limitations in accurately estimating  fetal weights.  I recommend weekly antenatal testing at your  office.  No early term delivery is indicated for suspected fetal growth  restriction. ---------------------------------------------------------------------- Recommendations    -Weekly BPP or NST at your office till delivery. ----------------------------------------------------------------------                  Noralee Space, MD Electronically Signed Final Report   03/01/2023 11:47 am --------------------------------------------------------------------     US FETAL BPP WO NON STRESS Patient Name: [name] DOB: 10/20/1995 MRN: [id number] LMP:   ULTRASOUND REPORT  Location: [facility] Date of Service: 03/18/2023   Indications: Biophysical Profile performed for indication of IUGR  Findings:  Singleton intrauterine pregnancy is visualized with FHR at 145 bpm.    Fetal presentation is Cephalic.  Placenta: posterior Decreased AFI: 6.4 cm, MVP 3.0 cm  BPP Scoring: Movement: 2/2  Tone: 2/2  Breathing: 2/2  AFI: 2/2  Impression: 1.  [redacted]w[redacted]d Viable Singleton Intrauterine pregnancy dated by previously  established criteria. 2. Decreased AFI is 6.4 cm.  3. BPP is 8/8  Recommendations: 1.Clinical correlation with the patient's History and Physical Exam.  Katheran Awe, RDMS  .I have reviewed this study and agree with documented findings.   Hildred Laser, MD [facility]   Assessment and Plan :[name] is a 66 y.o. [phone] at [redacted]w[redacted]d being admitted  for scheduled repeat cesarean section delivery with bilateral tubal ligation. SGA infant with low AFI noted. The patient is understanding of the planned procedure and is aware of and accepting of all surgical risks, including but not limited to: bleeding which may require transfusion or reoperation; infection which may require antibiotics; injury to bowel, bladder, ureters or other surrounding organs which may require repair; injury to the fetus; need for additional procedures including hysterectomy in the event of life-threatening complications; placental abnormalities wth subsequent pregnancies; incisional problems; blood clot disorders which may require blood thinners;, and other postoperative/anesthesia complications.   Patient desires permanent sterilization.  Other reversible forms of contraception were previously discussed with patient; she declines all other modalities. Risks of procedure discussed with patient including but not limited to: risk of regret, permanence of method, bleeding, infection, injury to surrounding organs and need for additional procedures.  Failure risk of about 1% with increased risk of ectopic gestation if pregnancy occurs was also discussed with patient.  Also discussed possibility of post-tubal pain syndrome. Patient verbalized understanding of these risks and wants to proceed with sterilization.  Medicaid sterilization forms compliant.  The patient is in agreement with the proposed plan, and gives informed written consent for the  procedure. All questions have been answered. To OR when ready.   Hildred Laser, MD [facility]

## 2023-03-22 NOTE — Transfer of Care (Signed)
Immediate Anesthesia Transfer of Care Note  Patient: [name]  Procedure(s) Performed: REPEAT CESAREAN SECTION WITH BILATERAL TUBAL LIGATION  Patient Location: PACU  Anesthesia Type:Spinal  Level of Consciousness: awake, alert , and oriented  Airway & Oxygen Therapy: Patient Spontanous Breathing  Post-op Assessment: Report given to RN and Post -op Vital signs reviewed and stable  Post vital signs: Reviewed and stable  Last Vitals:  Vitals Value Taken Time  BP 115/54 03/22/23 0927  Temp 36.9 C 03/22/23 0927  Pulse 78 03/22/23 0927  Resp 22 03/22/23 0927  SpO2 100 % 03/22/23 0927    Last Pain:  Vitals:   03/22/23 0927  TempSrc:   PainSc: 0-No pain         Complications: No notable events documented.

## 2023-03-22 NOTE — Anesthesia Preprocedure Evaluation (Signed)
Anesthesia Evaluation  Patient identified by MRN, date of birth, ID band Patient awake    Reviewed: Allergy & Precautions, H&P , NPO status , Patient's Chart, lab work & pertinent test results, reviewed documented beta blocker date and time   History of Anesthesia Complications (+) PONV and history of anesthetic complications  Airway Mallampati: II  TM Distance: >3 FB Neck ROM: full    Dental no notable dental hx. (+) Teeth Intact   Pulmonary asthma , former smoker   Pulmonary exam normal breath sounds clear to auscultation       Cardiovascular Exercise Tolerance: Good hypertension, On Medications negative cardio ROS  Rhythm:regular Rate:Normal     Neuro/Psych   Anxiety Depression    negative neurological ROS  negative psych ROS   GI/Hepatic Neg liver ROS, PUD,,,  Endo/Other  negative endocrine ROSdiabetes, Well Controlled    Renal/GU      Musculoskeletal   Abdominal   Peds  Hematology negative hematology ROS (+)   Anesthesia Other Findings   Reproductive/Obstetrics (+) Pregnancy                             Anesthesia Physical Anesthesia Plan  ASA: 2  Anesthesia Plan: Spinal   Post-op Pain Management:    Induction:   PONV Risk Score and Plan: 4 or greater  Airway Management Planned:   Additional Equipment:   Intra-op Plan:   Post-operative Plan:   Informed Consent: I have reviewed the patients History and Physical, chart, labs and discussed the procedure including the risks, benefits and alternatives for the proposed anesthesia with the patient or authorized representative who has indicated his/her understanding and acceptance.       Plan Discussed with:   Anesthesia Plan Comments:        Anesthesia Quick Evaluation

## 2023-03-22 NOTE — Op Note (Addendum)
Cesarean Section Procedure Note  Indications:  Previous C-section x 3  Pre-operative Diagnosis: 39 week 0 day pregnancy, previous C-section x 3, SGA infant (11%ile on 12/31 imaging), low AFI (6.9 cm), short interval pregnancy.   Post-operative Diagnosis: Same  Surgeon: Hildred Laser, MD  Assistants:  Raeford Razor, CNM  Procedure: Repeat low transverse Cesarean Section with bilateral tubal ligation  Anesthesia: Spinal anesthesia  Findings: Female infant, cephalic presentation, 2440 grams, with Apgar scores of 8 at one minute and 9 at five minutes. Intact placenta with 3 vessel cord.  Clear amniotic fluid at amniotomy, scant fluid The uterine outline, tubes and ovaries appeared normal.   Procedure Details: The patient was seen in the Holding Room. The risks, benefits, complications, treatment options, and expected outcomes were discussed with the patient.  The patient concurred with the proposed plan, giving informed consent.  The site of surgery properly noted/marked. The patient was taken to the Operating Room, identified as [facility] and the procedure verified as C-Section Delivery. A Time Out was held and the above information confirmed.  After induction of anesthesia, the patient was draped and prepped in the usual sterile manner. Anesthesia was tested and noted to be adequate. A Pfannenstiel incision was made and carried down through the subcutaneous tissue to the fascia. Fascial incision was made and extended transversely. The fascia was separated from the underlying rectus tissue superiorly and inferiorly. The peritoneum was identified and entered. Peritoneal incision was extended longitudinally. The surgical assist was able to provide retraction to allow for clear visualization of surgical site. An Alexis retractor was placed in the abdomen for additional retraction. The utero-vesical peritoneal reflection was incised transversely and the bladder flap was bluntly freed from  the lower uterine segment. A low transverse uterine incision was made. Delivered from cephalic presentation was a 2440 gram Female with Apgar scores of 8 at one minute and 9 at five minutes.  The assistant was able to apply adequate fundal pressure to allow for successful delivery of the fetus. After the umbilical cord was clamped and cut, cord blood was obtained for evaluation. Delayed cord clamping was observed. The placenta was removed intact and appeared normal. The uterus was exteriorized and cleared of all clots and debris. The uterine outline, tubes and ovaries appeared normal.  The uterine incision was closed with running locked sutures of 0-Vicryl.  Hemostasis was observed.   Attention was then turned to the fallopian tubes, and where the patient's right fallopian tube was identified and grasped with a Babcock clamp.  The tube was then followed out to the vimbria.  The Babcock clamp was then used to grasp the tube approximately 4 cm from the cornual region.  A 3 cm segment of tube was then ligated with a free tie of 0-Chromic using the Parkland method and excised.  The left fallopian tube was then ligated in a similar fashion and excised. The tubal lumens were cauterized bilaterally.  Good hemostasis was noted with bilateral fallopian tubes. The uterus was then returned to the abdomen.   The pericolic gutters were cleared of all clots and debris. There was an area of oozing on the left upper edge of the muscle layer, this was approximated with a figure-of-eight suture of 3-0 Vicryl.  The fascia was then reapproximated with a running suture of 0-Vicryl.  The skin was reapproximated with 4-0 Monocryl. A Lidoderm patch was placed over the incision site. The incision was covered with steri-strips and a pressure dressing.   Instrument,  sponge, and needle counts were correct prior the abdominal closure and at the conclusion of the case.    An experienced assistant was required given the standard of  surgical care given the complexity of the case.  This assistant was needed for exposure, dissection, suctioning, retraction, instrument exchange, and for overall help during the procedure.  Estimated Blood Loss:  235 ml      Drains: foley catheter to gravity drainage, 100 ml of clear urine at end of the procedure         Total IV Fluids: 1100 ml  Specimens: segments of bilateral fallopian tubes         Implants: None         Complications:  None; patient tolerated the procedure well.         Disposition: PACU - hemodynamically stable.         Condition: stable   Hildred Laser, MD [facility]

## 2023-03-22 NOTE — Anesthesia Procedure Notes (Signed)
Spinal  Patient location during procedure: OR Start time: 03/22/2023 7:52 AM End time: 03/22/2023 7:54 AM Reason for block: surgical anesthesia Staffing Performed: resident/CRNA  Anesthesiologist: Yevette Edwards, MD Resident/CRNA: Karoline Caldwell, CRNA Performed by: Karoline Caldwell, CRNA Authorized by: Yevette Edwards, MD   Preanesthetic Checklist Completed: patient identified, IV checked, site marked, risks and benefits discussed, surgical consent, monitors and equipment checked, pre-op evaluation and timeout performed Spinal Block Patient position: sitting Prep: ChloraPrep Patient monitoring: heart rate, continuous pulse ox, blood pressure and cardiac monitor Approach: midline Location: L3-4 Injection technique: single-shot Needle Needle type: Whitacre and Introducer  Needle gauge: 24 G Needle length: 9 cm Assessment Sensory level: T4 Events: CSF return Additional Notes Sterile aseptic technique used throughout the procedure.  Negative paresthesia. Negative blood return. Positive free-flowing CSF. Expiration date of kit checked and confirmed. Patient tolerated procedure well, without complications.

## 2023-03-23 ENCOUNTER — Encounter: Payer: Self-pay | Admitting: Obstetrics and Gynecology

## 2023-03-23 LAB — CBC
HCT: 27.6 % — ABNORMAL LOW (ref 36.0–46.0)
Hemoglobin: 9.3 g/dL — ABNORMAL LOW (ref 12.0–15.0)
MCH: 28.6 pg (ref 26.0–34.0)
MCHC: 33.7 g/dL (ref 30.0–36.0)
MCV: 84.9 fL (ref 80.0–100.0)
Platelets: 243 10*3/uL (ref 150–400)
RBC: 3.25 MIL/uL — ABNORMAL LOW (ref 3.87–5.11)
RDW: 13.5 % (ref 11.5–15.5)
WBC: 10.9 10*3/uL — ABNORMAL HIGH (ref 4.0–10.5)
nRBC: 0 % (ref 0.0–0.2)

## 2023-03-23 LAB — SURGICAL PATHOLOGY

## 2023-03-23 MED ORDER — IBUPROFEN 600 MG PO TABS: 600.0000 mg | ORAL_TABLET | Freq: Four times a day (QID) | ORAL | 1 refills | Status: AC | PRN

## 2023-03-23 MED ORDER — FERROUS SULFATE 325 (65 FE) MG PO TBEC: 325.0000 mg | DELAYED_RELEASE_TABLET | Freq: Every day | ORAL | 0 refills | Status: DC

## 2023-03-23 MED ORDER — DOCUSATE SODIUM 100 MG PO CAPS: 100.0000 mg | ORAL_CAPSULE | Freq: Two times a day (BID) | ORAL | 2 refills | Status: DC | PRN

## 2023-03-23 NOTE — Lactation Note (Addendum)
This note was copied from a baby's chart. Lactation Consultation Note  Patient Name: [name] Today's Date: 03/23/2023 Age:28 hours Reason for consult: Initial assessment;Term;Breastfeeding assistance;RN request   Maternal Data This is mom's 4 th baby, C/S. 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 RN requested LC assist mom with breastfeeding as baby is making a clicking noise when feeding at the right breast.  Has patient been taught Hand Expression?: Yes Does the patient have breastfeeding experience prior to this delivery?: Yes How long did the patient breastfeed?: 3-4 months  Feeding Mother's Current Feeding Choice: Breast Milk and Formula Provided tips and strategies to maximize position and latch technique. Baby's bottom lip was rolled inward. Once correcting lip latch clicking sound went away. LATCH Score Latch: Grasps breast easily, tongue down, lips flanged, rhythmical sucking. (Baby with lower lip rolled inward.)  Audible Swallowing: Spontaneous and intermittent  Type of Nipple: Everted at rest and after stimulation  Comfort (Breast/Nipple): Filling, red/small blisters or bruises, mild/mod discomfort  Hold (Positioning): No assistance needed to correctly position infant at breast.  LATCH Score: 9    Interventions Interventions: Breast feeding basics reviewed;Assisted with latch;Education  Discharge Discharge Education: Engorgement and breast care;Warning signs for feeding baby;Outpatient recommendation Pump: Personal  Consult Status Consult Status: Complete  Update provided to care nurse.  Fuller Song 03/23/2023, 12:16 PM

## 2023-03-23 NOTE — Anesthesia Post-op Follow-up Note (Signed)
  Anesthesia Pain Follow-up Note  Patient: [name]  Day #: 1  Date of Follow-up: 03/23/2023 Time: 8:32 AM  Last Vitals:  Vitals:   03/23/23 0200 03/23/23 0315  BP:  117/68  Pulse:  87  Resp:  18  Temp:  36.5 C  SpO2: 99% 100%    Level of Consciousness: alert  Pain: none   Side Effects:None  Catheter Site Exam:clean, dry, no drainage     Plan: D/C from anesthesia care at surgeon's request  Starling Manns

## 2023-03-23 NOTE — TOC Initial Note (Signed)
Transition of Care [facility]) - Initial/Assessment Note    Patient Details  Name: [name] MRN: [id number] Date of Birth: [birth year]  Transition of Care [facility]) CM/SW Contact:    Carmina Miller, LCSWA Phone Number: 03/23/2023, 3:06 PM  Clinical Narrative:                  CSW received consult for drug exposed newborn attempted twice to speak with pt, pt not available per RN but will not be dc until tomorrow, CSW provided phone number if pt is available before 5:00 pm to call CSW, otherwise assessment will be completed tomorrow.         Patient Goals and CMS Choice            Expected Discharge Plan and Services         Expected Discharge Date: 03/23/23                                    Prior Living Arrangements/Services                       Activities of Daily Living   ADL Screening (condition at time of admission) Independently performs ADLs?: Yes (appropriate for developmental age) Is the patient deaf or have difficulty hearing?: No Does the patient have difficulty seeing, even when wearing glasses/contacts?: No Does the patient have difficulty concentrating, remembering, or making decisions?: No  Permission Sought/Granted                  Emotional Assessment              Admission diagnosis:  Indication for care in labor or delivery [O75.9] Patient Active Problem List   Diagnosis Date Noted   Indication for care in labor or delivery 03/22/2023   Labor and delivery indication for care or intervention 03/15/2023   Poor dentition 03/08/2023   SGA (small for gestational age), fetal, affecting care of mother, antepartum, third trimester, other fetus 02/24/2023   Flu vaccine refused 12/09/2022   History of prior pregnancy with IUGR newborn 10/01/2022   History of gestational hypertension 10/01/2022   Short interval between pregnancies affecting pregnancy, antepartum 10/01/2022   Supervision of high risk pregnancy,  antepartum 08/11/2022   Postpartum hypertension 04/15/2022   Biliary colic 07/04/2019   History of 3 cesarean sections 11/29/2016   Depressive disorder 07/19/2010   Extrinsic asthma 04/03/2009   PCP:  [facility] Pharm[facility]:   [facility] [address], [place] - [address] [place] [address] Phone: [phone] Fax: [phone]  [id number] [place] [place] - [address] [place] [address] Phone: [phone] Fax: [phone]     Social Drivers of Health (SDOH) Social History: SDOH Screenings   Food Insecurity: No Food Insecurity (03/22/2023)  Housing: Low Risk  (03/22/2023)  Transportation Needs: No Transportation Needs (03/22/2023)  Utilities: Not At Risk (03/22/2023)  Financial Resource Strain: Low Risk  (08/11/2022)  Physical Activity: Inactive (08/11/2022)  Social Connections: Moderately Integrated (08/11/2022)  Stress: No Stress Concern Present (08/11/2022)  Tobacco Use: Medium Risk (03/22/2023)   SDOH Interventions:     Readmission Risk Interventions     No data to display

## 2023-03-23 NOTE — Progress Notes (Signed)
Postpartum Day # 1: Cesarean Delivery (repeat, with BTL)  Subjective: Patient reports tolerating PO, + flatus, and no problems voiding.  Notes mild incisional pain managed with PO pain medications.  Reports some burning and skin irritation noted at bandage site.   Objective: Vital signs in last 24 hours: Temp:  [97.5 F (36.4 C)-98.6 F (37 C)] 98 F (36.7 C) (01/22 0900) Pulse Rate:  [73-91] 79 (01/22 0900) Resp:  [18-20] 19 (01/22 0900) BP: (111-117)/(62-81) 117/72 (01/22 0900) SpO2:  [98 %-100 %] 99 % (01/22 0900)  Physical Exam:  General: alert and no distress Lungs: clear to auscultation bilaterally Breasts: normal appearance, no masses or tenderness Heart: regular rate and rhythm, S1, S2 normal, no murmur, click, rub or gallop Abdomen: soft, non-tender; bowel sounds normal; no masses,  no organomegaly Pelvis: Lochia appropriate, Uterine Fundus firm, Incision: bandage clean, dry, intact Extremities: DVT Evaluation: No evidence of DVT seen on physical exam. Negative Homan's sign. No cords or calf tenderness. No significant calf/ankle edema.    Recent Labs    03/21/23 1134 03/23/23 0558  HGB 10.7* 9.3*  HCT 32.2* 27.6*    Assessment/Plan: Status post Cesarean section. Doing well postoperatively.  Breastfeeding and formula feeding.  Contraception: Intrapartum BTL Regular diet Continue PO pain management Encourage ambulation Mild iron deficiency anemia of pregnancy, clinically insignificant, asymptomatic.  Patient inquires if she can be discharged home today. Advised that is possible as she has reached all recovery milestones.  Discharge home with standard precautions and return to clinic in 4-6 weeks.   Hildred Laser, MD [facility] at [facility]

## 2023-03-23 NOTE — Discharge Summary (Signed)
Postpartum Discharge Summary  Date of Service updated 03/23/2023     Patient Name: [name] DOB: 12/30/1995 MRN: [id number]  Date of admission: 03/22/2023 Delivery date:03/22/2023 Delivering provider: Hildred Laser Date of discharge: 03/23/2023  Admitting diagnosis: Indication for care in labor or delivery [O75.9] Intrauterine pregnancy: [redacted]w[redacted]d     Secondary diagnosis:  Active Problems:   History of 3 cesarean sections   Supervision of high risk pregnancy, antepartum   History of prior pregnancy with IUGR newborn   History of gestational hypertension   Short interval between pregnancies affecting pregnancy, antepartum   SGA (small for gestational age), fetal, affecting care of mother, antepartum, third trimester, other fetus  Additional problems: None    Discharge diagnosis: Term Pregnancy Delivered and Anemia (iron deficiency anemia of pregnancy)                                            Post partum procedures:postpartum tubal ligation Augmentation: N/A Complications: None  Hospital course: Sceduled C/S   28 y.o. yo [id number] at [redacted]w[redacted]d was admitted to the hospital 03/22/2023 for scheduled cesarean section with the following indication:Elective Repeat.Delivery details are as follows:  Membrane Rupture Time/Date: 8:28 AM,03/22/2023  Delivery Method:C-Section, Low Transverse Operative Delivery:N/A Details of operation can be found in separate operative note.  Patient had a postpartum course that was uncomplicated. She is ambulating, tolerating a regular diet, passing flatus, and urinating well. Patient is discharged home in stable condition on  03/23/23        Newborn Data: Birth date:03/22/2023 Birth time:8:29 AM Gender:Female Living status:Living Apgars:8 ,9  Weight:2440 g    Magnesium Sulfate received: No BMZ received: No Rhophylac:No MMR:No T-DaP:Given prenatally Flu: No (declined) RSV Vaccine received: Yes Transfusion:No Immunizations  administered: Immunization History  Administered Date(s) Administered   HPV 9-valent 09/18/2020   MMR 12/01/2016   Rsv, Bivalent, Protein Subunit Rsvpref,pf Verdis Frederickson) 02/04/2023   Tdap 09/06/2016, 10/12/2017, 01/05/2023    Physical exam  Vitals:   03/23/23 0044 03/23/23 0200 03/23/23 0315 03/23/23 0900  BP:   117/68 117/72  Pulse:   87 79  Resp:   18 19  Temp:   97.7 F (36.5 C) 98 F (36.7 C)  TempSrc:   Oral Oral  SpO2: 98% 99% 100% 99%  Weight:      Height:       General: alert and cooperative Lochia: appropriate Uterine Fundus: firm Incision: Healing well with no significant drainage, No significant erythema, Dressing is clean, dry, and intact DVT Evaluation: No evidence of DVT seen on physical exam. Negative Homan's sign. No cords or calf tenderness. No significant calf/ankle edema. Labs: Lab Results  Component Value Date   WBC 10.9 (H) 03/23/2023   HGB 9.3 (L) 03/23/2023   HCT 27.6 (L) 03/23/2023   MCV 84.9 03/23/2023   PLT 243 03/23/2023      Latest Ref Rng & Units 03/21/2023   11:34 AM  CMP  Glucose 70 - 99 mg/dL 88   BUN 6 - 20 mg/dL 5   Creatinine 7.84 - 6.96 mg/dL 2.95   Sodium 284 - 132 mmol/L 134   Potassium 3.5 - 5.1 mmol/L 3.1   Chloride 98 - 111 mmol/L 102   CO2 22 - 32 mmol/L 22   Calcium 8.9 - 10.3 mg/dL 8.2    Edinburgh Score:    03/23/2023  9:52 AM  Edinburgh Postnatal Depression Scale Screening Tool  I have been able to laugh and see the funny side of things. 0  I have looked forward with enjoyment to things. 0  I have blamed myself unnecessarily when things went wrong. 0  I have been anxious or worried for no good reason. 0  I have felt scared or panicky for no good reason. 0  Things have been getting on top of me. 0  I have been so unhappy that I have had difficulty sleeping. 0  I have felt sad or miserable. 0  I have been so unhappy that I have been crying. 0  The thought of harming myself has occurred to me. 0  Edinburgh  Postnatal Depression Scale Total 0      After visit meds:  Allergies as of 03/23/2023       Reactions   Latex Other (See Comments)   Burns her skin   Metformin And Related Shortness Of Breath   Sprintec 28 [norgestimate-eth Estradiol] Hives        Medication List     TAKE these medications    acetaminophen 500 MG tablet Commonly known as: TYLENOL Take 1,000 mg by mouth every 6 (six) hours as needed for moderate pain (pain score 4-6).   benzocaine 10 % mucosal gel Commonly known as: ORAJEL Use as directed 1 Application in the mouth or throat as needed for mouth pain.   docusate sodium 100 MG capsule Commonly known as: COLACE Take 1 capsule (100 mg total) by mouth 2 (two) times daily as needed for mild constipation.   ferrous sulfate 325 (65 FE) MG EC tablet Take 1 tablet (325 mg total) by mouth daily with breakfast.   ibuprofen 600 MG tablet Commonly known as: ADVIL Take 1 tablet (600 mg total) by mouth every 6 (six) hours as needed.   oxyCODONE-acetaminophen 5-325 MG tablet Commonly known as: PERCOCET/ROXICET Take 1-2 tablets by mouth every 6 (six) hours as needed for severe pain (pain score 7-10).   PRENATAL PO Take 1 tablet by mouth daily.         Discharge home in stable condition Infant Feeding: Bottle and Breast Infant Disposition:home with mother Discharge instruction: per After Visit Summary and Postpartum booklet. Activity: Advance as tolerated. Pelvic rest for 6 weeks.  Diet: routine diet Anticipated Birth Control: BTL done PP Postpartum Appointment:6 weeks Additional Postpartum F/U: Postpartum Depression checkup and Incision check 1 week Future Appointments: Future Appointments  Date Time Provider Department Center  03/29/2023  1:55 PM Hildred Laser, MD [facility] None   Follow up Visit:  Follow-up Information     Hildred Laser, MD Follow up.   Specialties: Obstetrics and Gynecology, Radiology Why: 1 week for incision check Contact  information: [address] [place] [address] [phone]                     03/23/2023 Hildred Laser, MD

## 2023-03-23 NOTE — Anesthesia Post-op Follow-up Note (Deleted)
  Anesthesia Pain Follow-up Note  Patient: [name]  Day #: 1  Date of Follow-up: 03/23/2023 Time: 8:31 AM  Last Vitals:  Vitals:   03/23/23 0200 03/23/23 0315  BP:  117/68  Pulse:  87  Resp:  18  Temp:  36.5 C  SpO2: 99% 100%    Level of Consciousness: alert  Pain: none   Side Effects:None  Catheter Site Exam:clean, dry, no drainage     Plan:   Starling Manns

## 2023-03-23 NOTE — Anesthesia Postprocedure Evaluation (Signed)
Anesthesia Post Note  Patient: [name]  Procedure(s) Performed: REPEAT CESAREAN SECTION WITH BILATERAL TUBAL LIGATION  Patient location during evaluation: Mother Baby Anesthesia Type: Spinal Level of consciousness: oriented and awake and alert Pain management: pain level controlled Vital Signs Assessment: post-procedure vital signs reviewed and stable Respiratory status: spontaneous breathing and respiratory function stable Cardiovascular status: blood pressure returned to baseline and stable Postop Assessment: no headache, no backache, no apparent nausea or vomiting and able to ambulate Anesthetic complications: no   No notable events documented.   Last Vitals:  Vitals:   03/23/23 0200 03/23/23 0315  BP:  117/68  Pulse:  87  Resp:  18  Temp:  36.5 C  SpO2: 99% 100%    Last Pain:  Vitals:   03/23/23 0442  TempSrc:   PainSc: 2                  Starling Manns

## 2023-03-24 MED ORDER — OXYCODONE-ACETAMINOPHEN 5-325 MG PO TABS: 1.0000 | ORAL_TABLET | Freq: Four times a day (QID) | ORAL | 0 refills | Status: DC | PRN

## 2023-03-24 NOTE — Discharge Summary (Signed)
Postpartum Discharge Summary  Date of Service updated 03/24/23      Patient Name: [name] DOB: 24-Sep-1995 MRN: [id number]  Date of admission: 03/22/2023 Delivery date:03/22/2023 Delivering provider: Hildred Laser Date of discharge: 03/24/2023  Admitting diagnosis: Indication for care in labor or delivery [O75.9] Intrauterine pregnancy: [redacted]w[redacted]d     Secondary diagnosis:  Active Problems:   History of 3 cesarean sections   Supervision of high risk pregnancy, antepartum   History of prior pregnancy with IUGR newborn   History of gestational hypertension   Short interval between pregnancies affecting pregnancy, antepartum   SGA (small for gestational age), fetal, affecting care of mother, antepartum, third trimester, other fetus  Additional problems: none    Discharge diagnosis: Term Pregnancy Delivered                                              Post partum procedures: none Augmentation: N/A Complications: None  Hospital course: Sceduled C/S   28 y.o. yo [id number] at [redacted]w[redacted]d was admitted to the hospital 03/22/2023 for scheduled cesarean section with the following indication:Elective Repeat.Delivery details are as follows:  Membrane Rupture Time/Date: 8:28 AM,03/22/2023  Delivery Method:C-Section, Low Transverse Operative Delivery:N/A Details of operation can be found in separate operative note.  Patient had an uncomplicated postpartum course.  She is ambulating, tolerating a regular diet, passing flatus, and urinating well. Patient is discharged home in stable condition on  03/24/23        Newborn Data: Birth date:03/22/2023 Birth time:8:29 AM Gender:Female Living status:Living Apgars:8 ,9  Weight:2440 g    Magnesium Sulfate received: No BMZ received: No Rhophylac:N/A MMR:N/A T-DaP:Given prenatally Flu: No RSV Vaccine received: Yes Transfusion:No Immunizations administered: Immunization History  Administered Date(s) Administered   HPV 9-valent 09/18/2020    MMR 12/01/2016   Rsv, Bivalent, Protein Subunit Rsvpref,pf Verdis Frederickson) 02/04/2023   Tdap 09/06/2016, 10/12/2017, 01/05/2023    Physical exam  Vitals:   03/23/23 1615 03/23/23 2040 03/24/23 0030 03/24/23 0807  BP: 123/61 118/75 122/86 114/75  Pulse: 83 89 85 83  Resp: 16 18 18 17   Temp: 98 F (36.7 C) 97.8 F (36.6 C) 97.9 F (36.6 C) 98.3 F (36.8 C)  TempSrc: Oral Oral Oral Oral  SpO2: 100% 100% 100% 100%  Weight:      Height:       General: alert, cooperative, and no distress Lochia: appropriate Uterine Fundus: firm Incision: Healing well with no significant drainage DVT Evaluation: No evidence of DVT seen on physical exam. Labs: Lab Results  Component Value Date   WBC 10.9 (H) 03/23/2023   HGB 9.3 (L) 03/23/2023   HCT 27.6 (L) 03/23/2023   MCV 84.9 03/23/2023   PLT 243 03/23/2023      Latest Ref Rng & Units 03/21/2023   11:34 AM  CMP  Glucose 70 - 99 mg/dL 88   BUN 6 - 20 mg/dL 5   Creatinine 7.42 - 5.95 mg/dL 6.38   Sodium 756 - 433 mmol/L 134   Potassium 3.5 - 5.1 mmol/L 3.1   Chloride 98 - 111 mmol/L 102   CO2 22 - 32 mmol/L 22   Calcium 8.9 - 10.3 mg/dL 8.2    Edinburgh Score:    03/23/2023    9:52 AM  Edinburgh Postnatal Depression Scale Screening Tool  I have been able to laugh and see the funny  side of things. 0  I have looked forward with enjoyment to things. 0  I have blamed myself unnecessarily when things went wrong. 0  I have been anxious or worried for no good reason. 0  I have felt scared or panicky for no good reason. 0  Things have been getting on top of me. 0  I have been so unhappy that I have had difficulty sleeping. 0  I have felt sad or miserable. 0  I have been so unhappy that I have been crying. 0  The thought of harming myself has occurred to me. 0  Edinburgh Postnatal Depression Scale Total 0      After visit meds:  Allergies as of 03/24/2023       Reactions   Latex Other (See Comments)   Burns her skin   Metformin And  Related Shortness Of Breath   Sprintec 28 [norgestimate-eth Estradiol] Hives        Medication List     TAKE these medications    acetaminophen 500 MG tablet Commonly known as: TYLENOL Take 1,000 mg by mouth every 6 (six) hours as needed for moderate pain (pain score 4-6).   benzocaine 10 % mucosal gel Commonly known as: ORAJEL Use as directed 1 Application in the mouth or throat as needed for mouth pain.   docusate sodium 100 MG capsule Commonly known as: COLACE Take 1 capsule (100 mg total) by mouth 2 (two) times daily as needed for mild constipation.   ferrous sulfate 325 (65 FE) MG EC tablet Take 1 tablet (325 mg total) by mouth daily with breakfast.   ibuprofen 600 MG tablet Commonly known as: ADVIL Take 1 tablet (600 mg total) by mouth every 6 (six) hours as needed.   oxyCODONE-acetaminophen 5-325 MG tablet Commonly known as: PERCOCET/ROXICET Take 1-2 tablets by mouth every 6 (six) hours as needed for severe pain (pain score 7-10).   PRENATAL PO Take 1 tablet by mouth daily.         Discharge home in stable condition Infant Feeding: Bottle and Breast Infant Disposition:home with mother Discharge instruction: per After Visit Summary and Postpartum booklet. Activity: Advance as tolerated. Pelvic rest for 6 weeks.  Diet: routine diet Anticipated Birth Control: BTL done PP Postpartum Appointment: Incision check in one week, office visit in 6 weeks Additional Postpartum F/U:  N/A Future Appointments: Future Appointments  Date Time Provider Department Center  03/29/2023  1:55 PM Hildred Laser, MD [facility] None   Follow up Visit:  Follow-up Information     Hildred Laser, MD Follow up.   Specialties: Obstetrics and Gynecology, Radiology Why: 1 week for incision check Contact information: [address] [place] [address] [phone]                     03/24/2023 Glenetta Borg, CNM

## 2023-03-24 NOTE — Final Progress Note (Signed)
Subjective: Postpartum Day 2: Cesarean Delivery [name] is feeling well overall. She is ambulating, voiding, and tolerating POs without difficulty. Her pain is well-controlled and her bleeding is WNL. Her mood is stable. She is breast and bottle feeding.   Objective: Vital signs in last 24 hours: Temp:  [97.8 F (36.6 C)-98.3 F (36.8 C)] 98.3 F (36.8 C) (01/23 0807) Pulse Rate:  [83-89] 83 (01/23 0807) Resp:  [16-18] 17 (01/23 0807) BP: (110-123)/(61-86) 114/75 (01/23 0807) SpO2:  [100 %] 100 % (01/23 0807)  Physical Exam:  General: alert, cooperative, and appears stated age Heart: RRR Lungs: CTAB Abdomen: soft, non-tender, normal bowel soudns Lochia: appropriate Uterine Fundus: firm Incision: healing well DVT Evaluation: No evidence of DVT seen on physical exam.  Recent Labs    03/21/23 1134 03/23/23 0558  HGB 10.7* 9.3*  HCT 32.2* 27.6*    Assessment/Plan: Status post Cesarean section. Doing well postoperatively.  Discharge home. Discharge instructions reviewed Pain medication previously prescribed by  Dr. Valentino Saxon Incision check in one week Office visit in 6 weeks   Glenetta Borg, CNM 03/24/2023, 9:22 AM

## 2023-03-24 NOTE — Progress Notes (Signed)
Patient discharged home with family.  Discharge instructions, when to follow up, and medications reviewed with patient.  Patient verbalized understanding. Patient will be escorted out by staff.

## 2023-03-24 NOTE — Discharge Instructions (Signed)
Call office if you have any of the following:  -Persistent headache or visual changes (possible high blood pressure) -Fever >101.0 F or chills (possible infection) -Breast concerns (engorgement, mastitis) -Excessive vaginal bleeding (soaking through more than one pad in 1 hr x 2 hr) -Incision drainage/redness/increased pain/warmth at site (possible infection)  -Leg pain or redness (possible blood clot) -Depression/anxiety increased symptoms 2 weeks after delivery  Activity & Hygiene: -Do not lift > 10 lbs for 6 weeks.  -No intercourse or tampons for 6 weeks.  -No swimming pools, hot tubs or tub baths- showers only for 6 weeks **No driving for 1-2 weeks or while taking pain medication after c-section  -It is normal to bleed for up to 6 weeks. You should not soak through more than 1 pad in 1 hour x 2 hours.  Breastfeeding: -Continue prenatal vitamin.  -Increase calories and fluids.  -Your milk will come in, in the next couple of days (right now it is colostrum).  -You may have a slight fever when your milk comes in, but it should go away on its own.   -If it does not, and rises above 101 F please call the doctor.  -You will also feel achy and your breasts will be firm. They will also start to leak.  *For breastfeeding concerns, the lactation consultant can be reached at [phone]  Not Breastfeeding: -Avoid breast stimulation and wear supportive bra -ICE helps decrease inflammation and pain -Express milk for comfort by hand, do not empty breast  Postpartum blues: -feelings of happy one minute and sad another minute are normal for the first few weeks. -if it gets worse please let your doctor know. It is very common!!

## 2023-03-25 NOTE — Progress Notes (Unsigned)
    OBSTETRICS/GYNECOLOGY POST-OPERATIVE CLINIC VISIT  Subjective:     [name] is a 28 y.o. female who presents to the clinic 1 weeks status post REPEAT CESAREAN SECTION WITH BILATERAL TUBAL LIGATION  for History of 3 C/S, undesired fertility . Eating a regular diet {with-without:5700} difficulty. Bowel movements are {normal/abnormal***:19619}. {pain control:13522::"The patient is not having any pain."}  {Common ambulatory SmartLinks:19316}  Review of Systems {ros; complete:30496}   Objective:   LMP 06/22/2022 (Exact Date)  There is no height or weight on file to calculate BMI.  General:  alert and no distress  Abdomen: soft, bowel sounds active, non-tender  Incision:   {incision:13716::"no dehiscence","incision well approximated","healing well","no drainage","no erythema","no hernia","no seroma","no swelling"}    Pathology:   MRN: [id number] Physician: Hildred Laser DOB/Age 09-Jul-1995 (Age: 25) Gender: F Collected Date: 03/22/2023 Received Date: 03/22/2023  FINAL DIAGNOSIS       1. Fallopian tube, right, segment :      - SEGMENT OF FALLOPIAN TUBE WITH FULL CROSS-SECTION OF THE LUMEN EXAMINED.       2. Fallopian tube, left, segment :      - SEGMENT OF FALLOPIAN TUBE WITH FULL CROSS-SECTION OF THE LUMEN EXAMINED.       ELECTRONIC SIGNATURE : Rubinas Md, Delice Bison , Sports administrator, Electronic Signature  MICROSCOPIC DESCRIPTION  CASE COMMENTS STAINS USED IN DIAGNOSIS: H&E H&E    CLINICAL HISTORY  SPECIMEN(S) OBTAINED 1. Fallopian tube, right, Segment 2. Fallopian tube, left, Segment  SPECIMEN COMMENTS: SPECIMEN CLINICAL INFORMATION: 1. History of 3 C/S, undesired fertility    Gross Description 1. Received in formalin is a 2.2 cm long, 0.4 cm diameter segment of tan tubular tissue partially covered by adnexal soft tissue and tan-purple, glistening serosa.The cut surfaces are grossly unremarkable.Representative cross sections are submitted in block  1A. 2. Received in formalin is a 2.1 cm long, 0.3 cm in diameter segment of tan tubular tissue partially covered by adnexal soft tissue and tan-pink glistening serosa.The cut surfaces are grossly unremarkable.Representative cross sections are submitted in block 2A.(SB:kh 03/22/23)   Assessment:   Patient s/p REPEAT CESAREAN SECTION WITH BILATERAL TUBAL LIGATION (surgery)  {doing well:13525::"Doing well postoperatively."}   Plan:   1. Continue any current medications as instructed by provider. 2. Wound care discussed. 3. Operative findings again reviewed. Pathology report discussed. 4. Activity restrictions: {restrictions:13723} 5. Anticipated return to work: {work return:14002}. 6. Follow up: 5 weeks for 6 week postpartum    Hildred Laser, MD [facility]

## 2023-03-25 NOTE — Patient Instructions (Signed)

## 2023-03-26 ENCOUNTER — Encounter: Payer: Self-pay | Admitting: Obstetrics and Gynecology

## 2023-03-29 ENCOUNTER — Ambulatory Visit: Payer: Medicaid Other | Admitting: Obstetrics and Gynecology

## 2023-03-29 ENCOUNTER — Encounter: Payer: Self-pay | Admitting: Obstetrics and Gynecology

## 2023-03-29 ENCOUNTER — Telehealth: Payer: Self-pay

## 2023-03-29 VITALS — BP 121/69 | HR 94 | Resp 16 | Ht 59.0 in | Wt 139.0 lb

## 2023-03-29 DIAGNOSIS — Z4889 Encounter for other specified surgical aftercare: Secondary | ICD-10-CM

## 2023-03-29 DIAGNOSIS — Z1332 Encounter for screening for maternal depression: Secondary | ICD-10-CM | POA: Diagnosis not present

## 2023-03-29 DIAGNOSIS — G8918 Other acute postprocedural pain: Secondary | ICD-10-CM

## 2023-03-29 MED ORDER — TRAMADOL HCL 50 MG PO TABS: 50.0000 mg | ORAL_TABLET | Freq: Four times a day (QID) | ORAL | 0 refills | Status: DC | PRN

## 2023-03-29 MED ORDER — OXYCODONE-ACETAMINOPHEN 5-325 MG PO TABS: 1.0000 | ORAL_TABLET | Freq: Four times a day (QID) | ORAL | 0 refills | Status: DC | PRN

## 2023-03-29 NOTE — Telephone Encounter (Signed)
Spoke with pharmacy. They advised they can not fill the rx w/o a diagnosis. Diagnosis given. Pharmacy states she has received too much pain medication in a short period of time. They are declining to fill rx.

## 2023-03-29 NOTE — Telephone Encounter (Signed)
Left voicemail to notify patient. Per Dr. Valentino Saxon she will send in new rx for something else.

## 2023-03-29 NOTE — Telephone Encounter (Signed)
[facility] VOICEMAIL: Patient states pharmacy is trying to get in touch with [place] about her medication. Requesting we contact them as soon as possible.

## 2023-03-30 MED ORDER — OXYCODONE-ACETAMINOPHEN 5-325 MG PO TABS: 1.0000 | ORAL_TABLET | Freq: Four times a day (QID) | ORAL | 0 refills | Status: AC | PRN

## 2023-03-30 NOTE — Telephone Encounter (Signed)
Patient states she got rid of the hydrocodone. She is aware of Dr. Oretha Milch response. She states she has tried the Tylenol and Ibuprofen and it doesn't touch the pain/isn't helping. Patient states she is changing pharmacy to [facility]. Changed in EPIC per patient's request. She is inquiring if Dr. Valentino Saxon will send the rx to [facility] to see if they will fill it. Advised will send request.

## 2023-03-30 NOTE — Telephone Encounter (Signed)
Patient states [facility] will not fill tramadol. She has tried tramadol before and it doesn't help, but she was willing to try. She is inquiring if they wouldn't fill the oxycodone due to the tylenol. Advised pharmacy declined to fill due to her having too many narcotics dispensed in a short time frame. Inquiring what she can now take for the pain. She advised she is planning to switch pharmacies. She states she is unable to sleep at night due to the pain.

## 2023-03-30 NOTE — Telephone Encounter (Signed)
[facility] VOICEMAIL: Patient requesting a return call about her medication.

## 2023-03-30 NOTE — Telephone Encounter (Signed)
I can try, but once they run it through the drug prescription database that all pharmacies have to utilize for narcotic prescriptions, they may not fill it either.

## 2023-03-30 NOTE — Telephone Encounter (Signed)
Spoke with her pharmacy. They noted that since she was changed from the hydrocodone to oxycodone for tooth pain in a very short time span and did not use them all, if she still has some of those left, she can utilize the remainder of those pills as they will not allow me to prescribe any other narcotics within this 30 day span.    I would also encourage her on using the lidocaine patches to her incision, as well as ice packs. She can alternate between 1000 mg of Tylenol and up to 800 mg of Motrin every 4 hours.

## 2023-05-02 NOTE — Progress Notes (Unsigned)
 OBSTETRICS POSTPARTUM CLINIC PROGRESS NOTE  Subjective:     [name] is a 28 y.o. [phone] female who presents for a postpartum visit. She is 6 weeks postpartum following a low cervical transverse Cesarean section. I have fully reviewed the prenatal and intrapartum course. The delivery was at 39 gestational weeks.  Anesthesia: spinal. Postpartum course has been going well. Baby's course has been good. Baby is feeding by bottle - Similac Pro Total Comfort . Bleeding: patient has not not resumed menses, with No LMP recorded.  Notes that she bled off and on for 4 weeks this time, which is unusual for her (usually only bleeds 1-2 weeks postpartum. Bowel function is normal. Bladder function is normal. Patient is not sexually active. Contraception method desired is tubal ligation. Postpartum depression screening: positive.  EDPS score is 0.    The following portions of the patient's history were reviewed and updated as appropriate: allergies, current medications, past family history, past medical history, past social history, past surgical history, and problem list.  Review of Systems A comprehensive review of systems was negative except for: Musculoskeletal: positive for back pain.  Patient reports a history of back pain since childhood (reports childhood injury due to accidental trauma, had 3 broken vertebrae). Has been to physical therapy in the remote past and has taken muscle relaxers but notes that this did not help. Notes pain has worsened after each pregnancy but just "deals with it". Endorses some mild associated bilateral sciatica.    Objective:    BP 108/69   Pulse 80   Ht 4\' 11"  (1.499 m)   Wt 138 lb 11.2 oz (62.9 kg)   Breastfeeding No   BMI 28.01 kg/m   General:  alert and no distress   Breasts:  inspection negative, no nipple discharge or bleeding,  Lungs: clear to auscultation bilaterally  Heart:  regular rate and rhythm, S1, S2 normal, no murmur, click, rub or gallop   Abdomen: soft, non-tender; bowel sounds normal; no masses,  no organomegaly.  Well healed Pfannenstiel incision   Vulva:  normal  Vagina: normal vagina, no discharge, exudate, lesion, or erythema  Cervix:  no cervical motion tenderness and no lesions  Corpus: normal size, contour, position, consistency, mobility, non-tender  Adnexa:  normal adnexa and no mass, fullness, tenderness  Rectal Exam: Not performed.            05/03/2023   10:17 AM 03/29/2023    5:16 PM 03/24/2023    8:09 AM 03/23/2023    9:52 AM 03/22/2023    7:30 PM  Edinburgh Postnatal Depression Scale Screening Tool  I have been able to laugh and see the funny side of things. 0 0 -- 0 --  I have looked forward with enjoyment to things. 0 0  0   I have blamed myself unnecessarily when things went wrong. 0 2  0   I have been anxious or worried for no good reason. 0 0  0   I have felt scared or panicky for no good reason. 0 0  0   Things have been getting on top of me. 0 0  0   I have been so unhappy that I have had difficulty sleeping. 0 0  0   I have felt sad or miserable. 0 0  0   I have been so unhappy that I have been crying. 0 0  0   The thought of harming myself has occurred to me. 0 0  0   Edinburgh Postnatal Depression Scale Total 0 2  0       Labs:  Lab Results  Component Value Date   HGB 9.3 (L) 03/23/2023     Assessment:   1. Postpartum care following cesarean delivery   2. Chronic midline low back pain with bilateral sciatica   3. History of back injury   4. Anemia, postpartum      Plan:   1. Contraception: tubal ligation performed at time of delivery 2. Will check Hgb for h/o postpartum anemia of less than 10.  3. Chronic low back pain with history of back injury as a child. Discussed other options for further evaluation/management now that pregnancy is complete, including referral to chiropractor or orthopedic. Patient ok to be referred to Ortho, referral placed.  4. Follow up in:  4-6  months  for annual exam    Hildred Laser, MD [facility]

## 2023-05-02 NOTE — Patient Instructions (Signed)

## 2023-05-03 ENCOUNTER — Encounter: Payer: Self-pay | Admitting: Obstetrics and Gynecology

## 2023-05-03 ENCOUNTER — Ambulatory Visit (INDEPENDENT_AMBULATORY_CARE_PROVIDER_SITE_OTHER): Payer: Medicaid Other | Admitting: Obstetrics and Gynecology

## 2023-05-03 DIAGNOSIS — G8929 Other chronic pain: Secondary | ICD-10-CM | POA: Insufficient documentation

## 2023-05-03 DIAGNOSIS — O9081 Anemia of the puerperium: Secondary | ICD-10-CM

## 2023-05-03 DIAGNOSIS — Z87828 Personal history of other (healed) physical injury and trauma: Secondary | ICD-10-CM

## 2023-06-07 NOTE — Progress Notes (Deleted)
    GYNECOLOGY PROGRESS NOTE  Subjective:    Patient ID: [name], female    DOB: 1996-03-01, 28 y.o.   MRN: [id number]  HPI  Patient is a 28 y.o. [id number] female who presents for consultation for egg donation and referral.  {Common ambulatory SmartLinks:19316}  Review of Systems {ros; complete:30496}   Objective:   not currently breastfeeding. There is no height or weight on file to calculate BMI. General appearance: {general exam:16600} Abdomen: {abdominal exam:16834} Pelvic: {pelvic exam:16852::"cervix normal in appearance","external genitalia normal","no adnexal masses or tenderness","no cervical motion tenderness","rectovaginal septum normal","uterus normal size, shape, and consistency","vagina normal without discharge"} Extremities: {extremity exam:5109} Neurologic: {neuro exam:17854}   Assessment:   No diagnosis found.   Plan:   There are no diagnoses linked to this encounter.   Hildred Laser, MD [facility]

## 2023-06-08 ENCOUNTER — Encounter: Payer: Self-pay | Admitting: Obstetrics and Gynecology

## 2023-06-08 DIAGNOSIS — Z87828 Personal history of other (healed) physical injury and trauma: Secondary | ICD-10-CM

## 2023-06-08 DIAGNOSIS — G8929 Other chronic pain: Secondary | ICD-10-CM

## 2023-06-10 ENCOUNTER — Ambulatory Visit: Admitting: Obstetrics and Gynecology

## 2023-06-30 ENCOUNTER — Other Ambulatory Visit: Payer: Self-pay

## 2023-06-30 ENCOUNTER — Encounter (HOSPITAL_BASED_OUTPATIENT_CLINIC_OR_DEPARTMENT_OTHER): Payer: Self-pay | Admitting: Oral Surgery

## 2023-07-05 NOTE — Anesthesia Preprocedure Evaluation (Signed)
 Anesthesia Evaluation  Patient identified by MRN, date of birth, ID band Patient awake    Reviewed: Allergy & Precautions, Patient's Chart, lab work & pertinent test results  History of Anesthesia Complications Negative for: history of anesthetic complications  Airway Mallampati: II  TM Distance: >3 FB Neck ROM: Full    Dental  (+) Missing, Poor Dentition   Pulmonary asthma , Patient abstained from smoking., former smoker   Pulmonary exam normal        Cardiovascular negative cardio ROS Normal cardiovascular exam     Neuro/Psych   Anxiety Depression    negative neurological ROS     GI/Hepatic Neg liver ROS, PUD,,,  Endo/Other  negative endocrine ROS    Renal/GU negative Renal ROS  negative genitourinary   Musculoskeletal negative musculoskeletal ROS (+)    Abdominal   Peds  Hematology negative hematology ROS (+)   Anesthesia Other Findings Dental caries  Reproductive/Obstetrics negative OB ROS                             Anesthesia Physical Anesthesia Plan  ASA: 2  Anesthesia Plan: General   Post-op Pain Management: Tylenol  PO (pre-op)*   Induction: Intravenous  PONV Risk Score and Plan: 3 and Ondansetron , Dexamethasone , Treatment may vary due to age or medical condition, Midazolam and Scopolamine  patch - Pre-op  Airway Management Planned: Nasal ETT  Additional Equipment: None  Intra-op Plan:   Post-operative Plan: Extubation in OR  Informed Consent: I have reviewed the patients History and Physical, chart, labs and discussed the procedure including the risks, benefits and alternatives for the proposed anesthesia with the patient or authorized representative who has indicated his/her understanding and acceptance.     Dental advisory given  Plan Discussed with: CRNA  Anesthesia Plan Comments:        Anesthesia Quick Evaluation

## 2023-07-06 ENCOUNTER — Other Ambulatory Visit: Payer: Self-pay

## 2023-07-06 ENCOUNTER — Ambulatory Visit (HOSPITAL_BASED_OUTPATIENT_CLINIC_OR_DEPARTMENT_OTHER)
Admission: RE | Admit: 2023-07-06 | Discharge: 2023-07-06 | Disposition: A | Discharge status: Home / Self Care | Attending: Oral Surgery | Admitting: Oral Surgery

## 2023-07-06 ENCOUNTER — Encounter (HOSPITAL_BASED_OUTPATIENT_CLINIC_OR_DEPARTMENT_OTHER): Payer: Self-pay | Admitting: Oral Surgery

## 2023-07-06 ENCOUNTER — Ambulatory Visit (HOSPITAL_BASED_OUTPATIENT_CLINIC_OR_DEPARTMENT_OTHER): Payer: Self-pay | Admitting: Certified Registered Nurse Anesthetist

## 2023-07-06 ENCOUNTER — Encounter (HOSPITAL_BASED_OUTPATIENT_CLINIC_OR_DEPARTMENT_OTHER)
Admission: RE | Disposition: A | Discharge status: Home / Self Care | Payer: Self-pay | Source: Home / Self Care | Attending: Oral Surgery

## 2023-07-06 DIAGNOSIS — Z01818 Encounter for other preprocedural examination: Secondary | ICD-10-CM

## 2023-07-06 DIAGNOSIS — K279 Peptic ulcer, site unspecified, unspecified as acute or chronic, without hemorrhage or perforation: Secondary | ICD-10-CM | POA: Diagnosis not present

## 2023-07-06 DIAGNOSIS — J45909 Unspecified asthma, uncomplicated: Secondary | ICD-10-CM | POA: Insufficient documentation

## 2023-07-06 DIAGNOSIS — K029 Dental caries, unspecified: Secondary | ICD-10-CM

## 2023-07-06 DIAGNOSIS — F430 Acute stress reaction: Secondary | ICD-10-CM | POA: Insufficient documentation

## 2023-07-06 DIAGNOSIS — F32A Depression, unspecified: Secondary | ICD-10-CM | POA: Insufficient documentation

## 2023-07-06 DIAGNOSIS — F419 Anxiety disorder, unspecified: Secondary | ICD-10-CM | POA: Diagnosis not present

## 2023-07-06 HISTORY — PX: TOOTH EXTRACTION: SHX859

## 2023-07-06 LAB — POCT PREGNANCY, URINE: Preg Test, Ur: NEGATIVE

## 2023-07-06 SURGERY — DENTAL RESTORATION/EXTRACTIONS
Anesthesia: General | Site: Mouth

## 2023-07-06 MED ORDER — FENTANYL CITRATE (PF) 100 MCG/2ML IJ SOLN
INTRAMUSCULAR | Status: AC
  Filled 2023-07-06: qty 2

## 2023-07-06 MED ORDER — MIDAZOLAM HCL 2 MG/2ML IJ SOLN
INTRAMUSCULAR | Status: AC
  Filled 2023-07-06: qty 2

## 2023-07-06 MED ORDER — OXYCODONE HCL 5 MG/5ML PO SOLN: 5.0000 mg | Freq: Once | ORAL | Status: AC | PRN

## 2023-07-06 MED ORDER — SCOPOLAMINE 1 MG/3DAYS TD PT72
1.0000 | MEDICATED_PATCH | Freq: Once | TRANSDERMAL | Status: DC
  Administered 2023-07-06: 1.5 mg via TRANSDERMAL

## 2023-07-06 MED ORDER — AMOXICILLIN 500 MG PO CAPS: 500.0000 mg | ORAL_CAPSULE | Freq: Three times a day (TID) | ORAL | 0 refills | Status: AC

## 2023-07-06 MED ORDER — LACTATED RINGERS IV SOLN: INTRAVENOUS | Status: DC

## 2023-07-06 MED ORDER — ROCURONIUM BROMIDE 10 MG/ML (PF) SYRINGE
PREFILLED_SYRINGE | INTRAVENOUS | Status: AC
  Filled 2023-07-06: qty 10

## 2023-07-06 MED ORDER — LACTATED RINGERS IV SOLN: INTRAVENOUS | Status: DC | PRN

## 2023-07-06 MED ORDER — SUCCINYLCHOLINE CHLORIDE 200 MG/10ML IV SOSY
PREFILLED_SYRINGE | INTRAVENOUS | Status: DC | PRN
  Administered 2023-07-06: 80 mg via INTRAVENOUS

## 2023-07-06 MED ORDER — OXYCODONE HCL 5 MG PO TABS
ORAL_TABLET | ORAL | Status: AC
  Filled 2023-07-06: qty 1

## 2023-07-06 MED ORDER — FENTANYL CITRATE (PF) 100 MCG/2ML IJ SOLN
INTRAMUSCULAR | Status: DC | PRN
  Administered 2023-07-06: 50 ug via INTRAVENOUS
  Administered 2023-07-06 (×2): 25 ug via INTRAVENOUS

## 2023-07-06 MED ORDER — ONDANSETRON HCL 4 MG/2ML IJ SOLN
INTRAMUSCULAR | Status: DC | PRN
  Administered 2023-07-06: 4 mg via INTRAVENOUS

## 2023-07-06 MED ORDER — ACETAMINOPHEN 500 MG PO TABS
ORAL_TABLET | ORAL | Status: AC
Start: 2023-07-06 — End: ?
  Filled 2023-07-06: qty 2

## 2023-07-06 MED ORDER — DEXAMETHASONE SODIUM PHOSPHATE 10 MG/ML IJ SOLN
INTRAMUSCULAR | Status: DC | PRN
  Administered 2023-07-06: 8 mg via INTRAVENOUS

## 2023-07-06 MED ORDER — STERILE WATER FOR IRRIGATION IR SOLN
Status: DC | PRN
  Administered 2023-07-06: 1000 mL

## 2023-07-06 MED ORDER — LIDOCAINE 2% (20 MG/ML) 5 ML SYRINGE
INTRAMUSCULAR | Status: DC | PRN
  Administered 2023-07-06: 60 mg via INTRAVENOUS

## 2023-07-06 MED ORDER — OXYCODONE HCL 5 MG PO TABS
5.0000 mg | ORAL_TABLET | Freq: Once | ORAL | Status: AC | PRN
  Administered 2023-07-06: 5 mg via ORAL

## 2023-07-06 MED ORDER — ATROPINE SULFATE 0.4 MG/ML IV SOLN
INTRAVENOUS | Status: AC
  Filled 2023-07-06: qty 1

## 2023-07-06 MED ORDER — ACETAMINOPHEN 500 MG PO TABS
1000.0000 mg | ORAL_TABLET | Freq: Once | ORAL | Status: AC
  Administered 2023-07-06: 1000 mg via ORAL

## 2023-07-06 MED ORDER — OXYCODONE-ACETAMINOPHEN 5-325 MG PO TABS: 1.0000 | ORAL_TABLET | ORAL | 0 refills | Status: AC | PRN

## 2023-07-06 MED ORDER — FENTANYL CITRATE (PF) 100 MCG/2ML IJ SOLN
25.0000 ug | INTRAMUSCULAR | Status: DC | PRN
  Administered 2023-07-06: 25 ug via INTRAVENOUS

## 2023-07-06 MED ORDER — DROPERIDOL 2.5 MG/ML IJ SOLN: 0.6250 mg | Freq: Once | INTRAMUSCULAR | Status: DC | PRN

## 2023-07-06 MED ORDER — ROCURONIUM BROMIDE 10 MG/ML (PF) SYRINGE
PREFILLED_SYRINGE | INTRAVENOUS | Status: DC | PRN
  Administered 2023-07-06: 10 mg via INTRAVENOUS

## 2023-07-06 MED ORDER — SUCCINYLCHOLINE CHLORIDE 200 MG/10ML IV SOSY
PREFILLED_SYRINGE | INTRAVENOUS | Status: AC
  Filled 2023-07-06: qty 10

## 2023-07-06 MED ORDER — BUPIVACAINE-EPINEPHRINE 0.25% -1:200000 IJ SOLN
INTRAMUSCULAR | Status: DC | PRN
Start: 2023-07-06 — End: 2023-07-06
  Administered 2023-07-06: 223 mL

## 2023-07-06 MED ORDER — PROPOFOL 10 MG/ML IV BOLUS
INTRAVENOUS | Status: DC | PRN
  Administered 2023-07-06: 120 mg via INTRAVENOUS
  Administered 2023-07-06: 40 mg via INTRAVENOUS

## 2023-07-06 MED ORDER — MIDAZOLAM HCL 2 MG/2ML IJ SOLN
INTRAMUSCULAR | Status: DC | PRN
  Administered 2023-07-06: 2 mg via INTRAVENOUS

## 2023-07-06 MED ORDER — CEFAZOLIN SODIUM-DEXTROSE 2-3 GM-%(50ML) IV SOLR
INTRAVENOUS | Status: DC | PRN
  Administered 2023-07-06: 2 g via INTRAVENOUS

## 2023-07-06 SURGICAL SUPPLY — 37 items
BLADE SURG 15 STRL LF DISP TIS (BLADE) ×1 IMPLANT
BNDG COHESIVE 2X5 TAN ST LF (GAUZE/BANDAGES/DRESSINGS) IMPLANT
BNDG EYE OVAL 2 1/8 X 2 5/8 (GAUZE/BANDAGES/DRESSINGS) IMPLANT
BUR CROSS CUT FISSURE 1.6 (BURR) ×1 IMPLANT
BUR EGG ELITE 4.0 (BURR) IMPLANT
CANISTER SUCT 1200ML W/VALVE (MISCELLANEOUS) ×1 IMPLANT
CATH ROBINSON RED A/P 10FR (CATHETERS) IMPLANT
COVER BACK TABLE 60X90IN (DRAPES) ×1 IMPLANT
COVER MAYO STAND STRL (DRAPES) ×1 IMPLANT
DRAPE U-SHAPE 76X120 STRL (DRAPES) ×1 IMPLANT
GLOVE BIO SURGEON STRL SZ 6.5 (GLOVE) IMPLANT
GLOVE BIO SURGEON STRL SZ8 (GLOVE) ×1 IMPLANT
GLOVE BIOGEL PI IND STRL 6.5 (GLOVE) IMPLANT
GLOVE SURG SS PI 8.0 STRL IVOR (GLOVE) IMPLANT
GOWN STRL REUS W/ TWL LRG LVL3 (GOWN DISPOSABLE) ×1 IMPLANT
GOWN STRL REUS W/ TWL XL LVL3 (GOWN DISPOSABLE) ×1 IMPLANT
IV NS 250ML BAXH (IV SOLUTION) IMPLANT
IV NS 500ML BAXH (IV SOLUTION) ×1 IMPLANT
NDL HYPO 22X1.5 SAFETY MO (MISCELLANEOUS) ×1 IMPLANT
NEEDLE HYPO 22X1.5 SAFETY MO (MISCELLANEOUS) ×1 IMPLANT
PACK BASIN DAY SURGERY FS (CUSTOM PROCEDURE TRAY) ×1 IMPLANT
SLEEVE IRRIGATION ELITE 7 (MISCELLANEOUS) ×1 IMPLANT
SLEEVE SCD COMPRESS KNEE MED (STOCKING) IMPLANT
SPIKE FLUID TRANSFER (MISCELLANEOUS) IMPLANT
SPONGE SURGIFOAM ABS GEL 12-7 (HEMOSTASIS) IMPLANT
SPONGE T-LAP 4X18 ~~LOC~~+RFID (SPONGE) ×1 IMPLANT
SUT CHROMIC 3 0 PS 2 (SUTURE) IMPLANT
SUT CHROMIC 3 0 SH 27 (SUTURE) IMPLANT
SYR BULB EAR ULCER 3OZ GRN STR (SYRINGE) ×1 IMPLANT
SYR CONTROL 10ML LL (SYRINGE) ×1 IMPLANT
TOOTHBRUSH ADULT (PERSONAL CARE ITEMS) IMPLANT
TOWEL GREEN STERILE FF (TOWEL DISPOSABLE) ×1 IMPLANT
TRAY DSU PREP LF (CUSTOM PROCEDURE TRAY) IMPLANT
TUBE CONNECTING 20X1/4 (TUBING) ×1 IMPLANT
TUBING IRRIGATION (MISCELLANEOUS) ×1 IMPLANT
WATER STERILE IRR 1000ML POUR (IV SOLUTION) ×1 IMPLANT
YANKAUER SUCT BULB TIP NO VENT (SUCTIONS) ×1 IMPLANT

## 2023-07-06 NOTE — H&P (Signed)
 HISTORY AND PHYSICAL  [name] is a 28 y.o. female patient with CC: Painful teeth.  1. Pre-op testing     Past Medical History:  Diagnosis Date   Abdominal pain    Anxiety    Asthma    Depression    Gestational hypertension    Nausea    Peptic ulcer disease    PONV (postoperative nausea and vomiting)    after 1st C-Section   Postpartum hypertension 04/15/2022   Pre-diabetes (history)    better with weight loss    Current Facility-Administered Medications  Medication Dose Route Frequency Provider Last Rate Last Admin   lactated ringers  infusion   Intravenous Continuous Finucane, Elizabeth M, DO 10 mL/hr at 07/06/23 1417 New Bag at 07/06/23 1417   scopolamine  (TRANSDERM-SCOP) 1 MG/3DAYS 1.5 mg  1 patch Transdermal Once Howze, Kathryn E, MD   1.5 mg at 07/06/23 1417   Allergies  Allergen Reactions   Latex Other (See Comments)    Burns her skin   Metformin And Related Shortness Of Breath   Hydrocodone  Hives   Sprintec 28 [Norgestimate-Eth Estradiol ] Hives   Tramadol  Anxiety   Active Problems:   * No active hospital problems. *  Vitals: Blood pressure 130/81, pulse 83, temperature 97.9 F (36.6 C), temperature source Temporal, resp. rate 18, height 4\' 11"  (1.499 m), weight 60.2 kg, last menstrual period 06/16/2023, SpO2 99%, not currently breastfeeding. Lab results: Results for orders placed or performed during the hospital encounter of 07/06/23 (from the past 24 hours)  Pregnancy, urine POC     Status: None   Collection Time: 07/06/23  2:19 PM  Result Value Ref Range   Preg Test, Ur NEGATIVE NEGATIVE   Radiology Results: No results found. General appearance: alert, cooperative, and no distress Head: Normocephalic, without obvious abnormality, atraumatic Eyes: negative Nose: Nares normal. Septum midline. Mucosa normal. No drainage or sinus tenderness. Throat: Multiple dental caries all remaining teeth. No p urulence, edema, fluctuance, trismus. Pharynx  clear.  Neck: no adenopathy  Assessment:Non-restorable teeth secondary to dental caries   Plan: Full mouth extractions with alveoloplasty. GA. Day surgery   [name] 07/06/2023

## 2023-07-06 NOTE — Discharge Instructions (Signed)
  Post Anesthesia Home Care Instructions  Activity: Get plenty of rest for the remainder of the day. A responsible individual must stay with you for 24 hours following the procedure.  For the next 24 hours, DO NOT: -Drive a car -Advertising copywriter -Drink alcoholic beverages -Take any medication unless instructed by your physician -Make any legal decisions or sign important papers.  Meals: Start with liquid foods such as gelatin or soup. Progress to regular foods as tolerated. Avoid greasy, spicy, heavy foods. If nausea and/or vomiting occur, drink only clear liquids until the nausea and/or vomiting subsides. Call your physician if vomiting continues.  Special Instructions/Symptoms: Your throat may feel dry or sore from the anesthesia or the breathing tube placed in your throat during surgery. If this causes discomfort, gargle with warm salt water. The discomfort should disappear within 24 hours.  If you had a scopolamine  patch placed behind your ear for the management of post- operative nausea and/or vomiting:  1. The medication in the patch is effective for 72 hours, after which it should be removed.  Wrap patch in a tissue and discard in the trash. Wash hands thoroughly with soap and water. 2. You may remove the patch earlier than 72 hours if you experience unpleasant side effects which may include dry mouth, dizziness or visual disturbances. 3. Avoid touching the patch. Wash your hands with soap and water after contact with the patch.    Next dose of Tylenol  can be taken at 8pm if needed.

## 2023-07-06 NOTE — Anesthesia Postprocedure Evaluation (Signed)
 Anesthesia Post Note  Patient: [name]  Procedure(s) Performed: DENTAL RESTORATION/EXTRACTIONS (Mouth)     Patient location during evaluation: PACU Anesthesia Type: General Level of consciousness: awake and alert Pain management: pain level controlled Vital Signs Assessment: post-procedure vital signs reviewed and stable Respiratory status: spontaneous breathing, nonlabored ventilation and respiratory function stable Cardiovascular status: blood pressure returned to baseline Postop Assessment: no apparent nausea or vomiting Anesthetic complications: no   No notable events documented.          Rayfield [place]

## 2023-07-06 NOTE — Transfer of Care (Signed)
 Immediate Anesthesia Transfer of Care Note  Patient: [name]  Procedure(s) Performed: DENTAL RESTORATION/EXTRACTIONS (Mouth)  Patient Location: PACU  Anesthesia Type:General  Level of Consciousness: drowsy and patient cooperative  Airway & Oxygen Therapy: Patient Spontanous Breathing and Patient connected to nasal cannula oxygen  Post-op Assessment: Report given to RN and Post -op Vital signs reviewed and stable  Post vital signs: Reviewed and stable  Last Vitals:  Vitals Value Taken Time  BP    Temp    Pulse 85 07/06/23 1635  Resp    SpO2 100 % 07/06/23 1635  Vitals shown include unfiled device data.  Last Pain:  Vitals:   07/06/23 1415  TempSrc: Temporal  PainSc: 9       Patients Stated Pain Goal: 5 (07/06/23 1415)  Complications: No notable events documented.

## 2023-07-06 NOTE — H&P (Signed)
 Anesthesia H&P Update: History and Physical Exam reviewed; patient is OK for planned anesthetic and procedure. ? ?

## 2023-07-06 NOTE — Op Note (Signed)
 07/06/2023  4:25 PM  PATIENT:  [name]  28 y.o. female  PRE-OPERATIVE DIAGNOSIS:  NON RESTORABLE TEETH #'S 2, 3, 4, 7, 8, 9, 10, 11, 12, 13, 15, 19, 20, 21, 22, 23, 24, 25, 26, 27, 28, 29, 30, 31  POST-OPERATIVE DIAGNOSIS:  SAME  PROCEDURE:  Procedure(s): EXTRACTION TEETH #'S 2, 3, 4, 7, 8, 9, 10, 11, 12, 13, 15, 19, 20, 21, 22, 23, 24, 25, 26, 27, 28, 29, 30, 31, ALVEOLOPLASTY  SURGEON:  Surgeon(s): [name], DMD  ANESTHESIA:   local and general  EBL:  150CC  DRAINS: none   SPECIMEN:  No Specimen  COUNTS:  YES  PLAN OF CARE: Discharge to home after PACU  PATIENT DISPOSITION:  PACU - hemodynamically stable.   PROCEDURE DETAILS: Dictation #   Cornelia Dieter, DMD 07/06/2023 4:25 PM

## 2023-07-06 NOTE — Anesthesia Procedure Notes (Signed)
 Procedure Name: Intubation Date/Time: 07/06/2023 3:25 PM  Performed by: Steffani Edman, CRNAPre-anesthesia Checklist: Patient identified, Emergency Drugs available, Suction available and Patient being monitored Patient Re-evaluated:Patient Re-evaluated prior to induction Oxygen Delivery Method: Circle System Utilized Preoxygenation: Pre-oxygenation with 100% oxygen Induction Type: IV induction Ventilation: Mask ventilation without difficulty Laryngoscope Size: Mac and 3 Grade View: Grade I Nasal Tubes: Nasal Rae and Nasal prep performed Number of attempts: 1 Placement Confirmation: ETT inserted through vocal cords under direct vision, positive ETCO2 and breath sounds checked- equal and bilateral Tube secured with: Tape Dental Injury: Teeth and Oropharynx as per pre-operative assessment

## 2023-07-07 ENCOUNTER — Encounter (HOSPITAL_BASED_OUTPATIENT_CLINIC_OR_DEPARTMENT_OTHER): Payer: Self-pay | Admitting: Oral Surgery

## 2023-07-07 NOTE — Op Note (Signed)
 [name], [name] MEDICAL RECORD NO: [id number] ACCOUNT NO: [id number] DATE OF BIRTH: 1995/11/06 FACILITY: [facility] LOCATION: [facility] PHYSICIAN: Cornelia Dieter, DDS  Operative Report   DATE OF PROCEDURE: 07/06/2023  PREOPERATIVE DIAGNOSES: Nonrestorable teeth secondary to dental caries #2, 3, 4, 7, 8, 9, 10, 11, 12, 13, 15, 19, 20, 21, 22, 23, 24, 25, 26, 27, 28, 29, 30, and 31.  POSTOPERATIVE DIAGNOSES: Nonrestorable teeth secondary to dental caries #2, 3, 4, 7, 8, 9, 10, 11, 12, 13, 15, 19, 20, 21, 22, 23, 24, 25, 26, 27, 28, 29, 30, and 31.  PROCEDURE: Alveoplasty, right and left maxilla and mandible.  SURGEON: Cornelia Dieter, DDS  ANESTHESIA: General. Nasal intubation. Dr. Otis Blocker attending.  DESCRIPTION OF PROCEDURE: The patient was taken to the operating room and placed on the table in supine position. General anesthesia was administered. A nasal endotracheal tube was placed and secured. The eyes were protected and the patient was draped  for surgery. A timeout was performed. The posterior pharynx was suctioned and a throat pack was placed. 0.25% Marcaine  with 1:200,000 epinephrine  was infiltrated in an inferior alveolar block on the right and left sides and in buccal and palatal  infiltration in the maxilla around the teeth to be removed. A bite block was placed on the right side of the mouth. The left side was operated first. A #15 blade was used to make an incision beginning at tooth #19, carried forward in the buccal and  lingual sulcus until tooth #27 was encountered. The periosteum was reflected from around these teeth. The teeth were elevated and removed with the dental forceps. The sockets were curetted. The periosteum was reflected and alveoloplasty was performed  using the Egg burr to remove bony irregularities. Then, the area was irrigated and closed with 3-0 chromic. Then, attention was turned to the left maxilla. A 15 blade was used to make an incision around tooth  #15 and then around teeth #13, 12, 11, 10, 9,  8, and 7. The periosteum was reflected from around these teeth. The teeth were elevated with a 301 elevator and removed from the mouth with the dental forceps. The sockets were curetted. The periosteum was reflected to expose the alveolar crest and then  alveoloplasty was performed to remove bony irregularities and sharp edges. Then, the area was irrigated and closed with 3-0 chromic. Then, the bite block was repositioned to the other side of the mouth. Attention was turned to the right side. The 15  blade was used to make an incision around teeth #28, #29, #30, and #31 and around teeth #2, #3, and #4. Then, the periosteum was reflected from around these teeth. The teeth were elevated. The teeth were removed with the forceps except for tooth #31,  which required removal of circumferential bone with the Stryker handpiece and fissure bur. Then, the tooth was removed. Then, the sockets were curetted. The periosteum was deflected and alveoplasty was performed and then the area was irrigated and closed  with 3-0 chromic. Then, the oral cavity was irrigated and suctioned. The throat pack was removed. The patient was left in the care of anesthesia for extubation and transported to recovery with plans for discharge home.   ESTIMATED BLOOD LOSS: 150 mL.  COMPLICATIONS: None.  No drains.  COUNTS: Correct.    SUJ D: 07/06/2023 4:30:21 pm T: 07/07/2023 3:39:00 am  JOB: [id number]/ [id number]
